# Patient Record
Sex: Female | Born: 1975 | Race: White | Hispanic: No | Smoking: Current every day smoker
Health system: Southern US, Community
[De-identification: ages and names within clinical notes are randomized; demographics above are authoritative.]

## PROBLEM LIST (undated history)

## (undated) DIAGNOSIS — F32A Depression, unspecified: Secondary | ICD-10-CM

## (undated) DIAGNOSIS — R51 Headache: Secondary | ICD-10-CM

## (undated) DIAGNOSIS — F1010 Alcohol abuse, uncomplicated: Secondary | ICD-10-CM

## (undated) DIAGNOSIS — F319 Bipolar disorder, unspecified: Secondary | ICD-10-CM

## (undated) DIAGNOSIS — F329 Major depressive disorder, single episode, unspecified: Secondary | ICD-10-CM

## (undated) DIAGNOSIS — F1910 Other psychoactive substance abuse, uncomplicated: Secondary | ICD-10-CM

## (undated) HISTORY — PX: BREAST ENHANCEMENT SURGERY: SHX7

---

## 1998-01-04 ENCOUNTER — Other Ambulatory Visit: Admission: RE | Admit: 1998-01-04 | Discharge: 1998-01-04 | Payer: Self-pay | Admitting: Obstetrics and Gynecology

## 1998-12-14 ENCOUNTER — Other Ambulatory Visit: Admission: RE | Admit: 1998-12-14 | Discharge: 1998-12-14 | Payer: Self-pay | Admitting: Obstetrics and Gynecology

## 1999-03-30 ENCOUNTER — Other Ambulatory Visit: Admission: RE | Admit: 1999-03-30 | Discharge: 1999-03-30 | Payer: Self-pay | Admitting: Obstetrics and Gynecology

## 1999-10-27 ENCOUNTER — Inpatient Hospital Stay (HOSPITAL_COMMUNITY): Admission: AD | Admit: 1999-10-27 | Discharge: 1999-10-28 | Payer: Self-pay | Admitting: *Deleted

## 2000-07-18 ENCOUNTER — Other Ambulatory Visit: Admission: RE | Admit: 2000-07-18 | Discharge: 2000-07-18 | Payer: Self-pay | Admitting: Obstetrics and Gynecology

## 2001-08-08 ENCOUNTER — Other Ambulatory Visit: Admission: RE | Admit: 2001-08-08 | Discharge: 2001-08-08 | Payer: Self-pay | Admitting: Obstetrics and Gynecology

## 2001-12-02 ENCOUNTER — Inpatient Hospital Stay (HOSPITAL_COMMUNITY): Admission: AD | Admit: 2001-12-02 | Discharge: 2001-12-04 | Payer: Self-pay | Admitting: Obstetrics and Gynecology

## 2003-08-17 ENCOUNTER — Other Ambulatory Visit: Admission: RE | Admit: 2003-08-17 | Discharge: 2003-08-17 | Payer: Self-pay | Admitting: Obstetrics and Gynecology

## 2004-11-16 ENCOUNTER — Other Ambulatory Visit: Admission: RE | Admit: 2004-11-16 | Discharge: 2004-11-16 | Payer: Self-pay | Admitting: Obstetrics and Gynecology

## 2005-07-08 ENCOUNTER — Emergency Department (HOSPITAL_COMMUNITY): Admission: EM | Admit: 2005-07-08 | Discharge: 2005-07-09 | Payer: Self-pay | Admitting: Emergency Medicine

## 2006-01-02 ENCOUNTER — Inpatient Hospital Stay (HOSPITAL_COMMUNITY): Admission: AD | Admit: 2006-01-02 | Discharge: 2006-01-04 | Payer: Self-pay | Admitting: Obstetrics and Gynecology

## 2006-02-16 ENCOUNTER — Other Ambulatory Visit: Admission: RE | Admit: 2006-02-16 | Discharge: 2006-02-16 | Payer: Self-pay | Admitting: Obstetrics and Gynecology

## 2009-03-03 ENCOUNTER — Inpatient Hospital Stay (HOSPITAL_COMMUNITY): Admission: AD | Admit: 2009-03-03 | Discharge: 2009-03-03 | Payer: Self-pay | Admitting: Obstetrics and Gynecology

## 2009-03-27 ENCOUNTER — Inpatient Hospital Stay (HOSPITAL_COMMUNITY): Admission: AD | Admit: 2009-03-27 | Discharge: 2009-03-29 | Payer: Self-pay | Admitting: Obstetrics and Gynecology

## 2010-07-19 LAB — CBC
HCT: 32.6 % — ABNORMAL LOW (ref 36.0–46.0)
HCT: 36 % (ref 36.0–46.0)
Hemoglobin: 11.1 g/dL — ABNORMAL LOW (ref 12.0–15.0)
MCHC: 34 g/dL (ref 30.0–36.0)
MCHC: 34.1 g/dL (ref 30.0–36.0)
MCV: 96.4 fL (ref 78.0–100.0)
RBC: 3.73 MIL/uL — ABNORMAL LOW (ref 3.87–5.11)
RDW: 13 % (ref 11.5–15.5)
WBC: 10.6 10*3/uL — ABNORMAL HIGH (ref 4.0–10.5)
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
## 2010-09-02 NOTE — H&P (Signed)
NAME:  Madison Garza, Madison Garza NO.:  [id number]   MEDICAL RECORD NO.:  [id number]                   PATIENT TYPE:  INP   LOCATION:  9174                                 FACILITY:  [facility]   PHYSICIAN:  Janine Limbo, M.D.            DATE OF BIRTH:  05/03/1975   DATE OF ADMISSION:  12/02/2001  DATE OF DISCHARGE:                                HISTORY & PHYSICAL   HISTORY OF PRESENT ILLNESS:  This is a 35 year old gravida 3 para 2 at 23  weeks who presents from the office status post rupture of membranes at 6:30  a.m. with clear fluid.  Cervix in the office was 4 cm, 70% effaced, and -2  station with a vertex presentation.  Pregnancy has been followed by the  nurse midwife service and is remarkable for:  1. Unknown LMP.  2. History of LGA.  3. Group B strep negative.   OBSTETRICAL HISTORY:  Remarkable for vaginal delivery in 1997 of  female  infant at [redacted] weeks gestation weighing 7 pounds 12 ounces, uncomplicated.  She had a spontaneous vaginal delivery in 2001 of a female infant at [redacted] weeks  gestation weighing 9 pounds 9 ounces, uncomplicated.   MEDICAL HISTORY:  Remarkable for abnormal Pap and cryosurgery, history of  HPV.   SURGICAL HISTORY:  Remarkable for wisdom teeth extraction.   FAMILY HISTORY:  Remarkable for maternal grandfather with MI, and paternal  grandmother and maternal grandfather with hypertension.  Maternal  grandmother with liver cancer.   GENETIC HISTORY:  Unremarkable.   SOCIAL HISTORY:  The patient is married to [facility] who is involved and  supportive.  She is of the [employer].  She denies  any alcohol, tobacco, or drug use.   PRENATAL LABORATORY DATA:  Hemoglobin 13.0, platelets 226.  Blood type A  positive, antibody screen negative.  RPR nonreactive.  Rubella equivocal.  HBsAg negative.  HIV nonreactive.  Gonorrhea negative, chlamydia negative.  AFP/free beta within normal limits.  Glucose challenge  within normal limits.  Group B strep negative.   OBJECTIVE DATA:  VITAL SIGNS:  Stable, afebrile.  HEENT:  Within normal limits.  NECK:  Thyroid normal, not enlarged.  CHEST:  Clear to auscultation.  BREASTS:  Soft, nontender.  HEART:  Regular rate and rhythm.  ABDOMEN:  Gravid in its contour.  Vertex to Leopold's.  EFM shows reactive  fetal heart rate with positive accelerations and average variability.  Uterine contractions every five to seven  minutes.  PELVIC:  Cervical exam in the office was 3-4 cm, 70% effaced, -2 station,  vertex presentation.  EXTREMITIES:  Within normal limits.    ASSESSMENT:  1. Intrauterine pregnancy at term.  2. Spontaneous rupture of membranes, clear fluid.  3. Early labor.   PLAN:  1. Admit to birthing suites, Dr. Stefano Gaul aware.  2. Routine C.N.M. orders.  3. Stadol p.r.n. for analgesia.  Marie L. Williams, C.N.M.                 Janine Limbo, M.D.    MLW/MEDQ  D:  12/02/2001  T:  12/02/2001  Job:  [id number]

## 2010-09-02 NOTE — H&P (Signed)
[facility]  Patient:    Madison Garza, Madison Garza                         MRN: [id number] Attending:  Georgina Peer, M.D. Dictator:   Wynelle Bourgeois, C.N.M.                         History and Physical  HISTORY OF PRESENT ILLNESS:   Madison Garza is a 35 year old, gravida 2, para 1-0-0-1, at 40-1/7 weeks who presented to the office this morning with complaint of regular uterine contractions since 4 a.m.  She denies any bleeding or leaking and reports positive fetal movement.  Her cervical examination in the office was 6 to 7 cm,  complete, and -1 to 0 station with a vertex presentation.  Her pregnancy has been remarkable for:  1) Abnormal Pap.  2) HPV.  3) Previous smoker.  4) First trimester bleeding.  5) Unknown LMP.  6) Rubella nonimmune.  7) Group B Strep negative.  PRENATAL LABORATORY DATA:     Hemoglobin 13.7, hematocrit 40.4, platelets 218. Blood type A positive, antibody screen negative, RPR nonreactive, rubella nonimmune.  HBSAG negative.  Pap test within normal limits.  Gonorrhea and Chlamydia both negative.  AFP free beta normal.  Glucose Challenge within normal limits.  Group B Strep negative.  PAST MEDICAL HISTORY:         Remarkable for abnormal Pap smear eight years ago  treated with cryosurgery and history of HPV.  She has also had wisdom teeth removed as a teenager.  PAST OBSTETRIC HISTORY:       Remarkable for a spontaneous vaginal delivery in December of 1997 of a female infant weight 7 pounds 15 ounces at 41-5/7 weeks after 10 hours of labor with no complications.  FAMILY HISTORY:               Remarkable for a maternal grandfather with heart disease, paternal grandmother with hypertension, and maternal grandfather with hypertension.  A maternal grandmother with liver cancer.  GENETIC HISTORY:              Unremarkable.  SOCIAL HISTORY:               She is married to [employer] who is involved and supportive.  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  She is  previous smoker who quit in November of 2000.  She denies any alcohol or drug use.  PHYSICAL EXAMINATION:  HEENT:                        Within normal limits.  Thyroid normal, not enlarged.  BREASTS:                      Soft and nontender.  CHEST:                        Clear to auscultation bilaterally.  HEART:                        Regular rate and rhythm.  ABDOMEN:                      Gravid at 39 cm.  Soft and nontender.  EFM reactive NST with a negative CST, average variability.  Uterine contractions  every three to four minutes, strong.  PELVIC:                       Cervical examination 6 to 7, complete, and 0 with a vertex presentation.  EXTREMITIES:                  Within normal limits.  ASSESSMENT:                   1. Intrauterine pregnancy at 40-1/7 weeks.                               2.  Active labor, transition phase.                               3. Group B Strep negative.  PLAN:                         1) Admit to birthing suite, Georgina Peer, M.D. notified.  2) Routine C.N.M. orders.  3) Anticipate spontaneous vaginal delivery. DD:  10/27/99 TD:  10/27/99 Job: [id number]

## 2011-12-24 ENCOUNTER — Encounter (HOSPITAL_COMMUNITY): Payer: Self-pay

## 2011-12-24 ENCOUNTER — Emergency Department (HOSPITAL_COMMUNITY)
Admission: EM | Admit: 2011-12-24 | Discharge: 2011-12-25 | Disposition: A | Discharge status: Home / Self Care | Payer: Self-pay | Attending: Emergency Medicine | Admitting: Emergency Medicine

## 2011-12-24 DIAGNOSIS — M436 Torticollis: Secondary | ICD-10-CM | POA: Insufficient documentation

## 2011-12-24 DIAGNOSIS — F17200 Nicotine dependence, unspecified, uncomplicated: Secondary | ICD-10-CM | POA: Insufficient documentation

## 2011-12-24 HISTORY — DX: Headache: R51

## 2011-12-24 MED ORDER — IBUPROFEN 800 MG PO TABS
800.0000 mg | ORAL_TABLET | Freq: Once | ORAL | Status: AC
  Administered 2011-12-24: 800 mg via ORAL
  Filled 2011-12-24: qty 1

## 2011-12-24 MED ORDER — DIAZEPAM 5 MG PO TABS
5.0000 mg | ORAL_TABLET | Freq: Once | ORAL | Status: AC
  Administered 2011-12-24: 5 mg via ORAL
  Filled 2011-12-24: qty 1

## 2011-12-24 NOTE — ED Notes (Addendum)
Pt reports "crick" in neck starting today. Pt has difficulty flexing and extending neck.  Pt reports she gets "cricks" in her neck every couple months with headaches. Pt states "the back of my head feels (tactile) funny". Pt has photosensitivity and dizziness when she bends down. Pt denies n/v/d, fevers, and chills

## 2011-12-24 NOTE — ED Provider Notes (Signed)
History     CSN: [id number]  Arrival date & time 12/24/11  2025   First MD Initiated Contact with Patient 12/24/11 2307      Chief Complaint  Patient presents with  . Headache    (Consider location/radiation/quality/duration/timing/severity/associated sxs/prior treatment) HPI Comments: Patient is having right neck pain, spasm, difficult.  For her to turn her head.  All the way to the right.  This is been going on for the past 24 hours.  She has a history of "quick" in her neck.  She does sleep with a ceiling fan.  That was on her head.  She also reports, that she has a small nodule on the top of her head, that she's concerned about it's tender to touch.  She also reports paresthesia, tingly feeling to her scalp is been going on and off for a while.zxShe has not taken any OTC meds for her symptoms  Patient is a 36 y.o. female presenting with headaches. The history is provided by the patient.  Headache  This is a new problem. The headache is associated with nothing. The quality of the pain is described as dull. The pain is at a severity of 2/10. The pain is mild. The pain does not radiate. Pertinent negatives include no fever, no shortness of breath and no nausea.    Past Medical History  Diagnosis Date  . Headache     Past Surgical History  Procedure Date  . Breast enhancement surgery     No family history on file.  History  Substance Use Topics  . Smoking status: Current Some Day Smoker -- 0.5 packs/day  . Smokeless tobacco: Not on file  . Alcohol Use: Yes     social    OB History    Grav Para Term Preterm Abortions TAB SAB Ect Mult Living                  Review of Systems  Constitutional: Negative for fever and chills.  HENT: Negative for congestion.   Respiratory: Negative for shortness of breath.   Gastrointestinal: Negative for nausea.  Musculoskeletal: Negative for joint swelling.  Skin: Negative for pallor, rash and wound.  Neurological: Positive for  headaches. Negative for dizziness.    Allergies  Review of patient's allergies indicates no known allergies.  Home Medications   Current Outpatient Rx  Name Route Sig Dispense Refill  . ASPIRIN-ACETAMINOPHEN-CAFFEINE 250-250-65 MG PO TABS Oral Take 1 tablet by mouth every 6 (six) hours as needed. migraine    . PERMETHRIN 5 % EX CREA Topical Apply 1 application topically once.    Marland Kitchen HYDROCODONE-ACETAMINOPHEN 5-325 MG PO TABS Oral Take 1 tablet by mouth every 4 (four) hours as needed for pain. 5 tablet 0    For severe pain  . IBUPROFEN 800 MG PO TABS Oral Take 1 tablet (800 mg total) by mouth every 6 (six) hours as needed for pain. 30 tablet 0    BP 99/55  Pulse 60  Temp 98.8 F (37.1 C) (Oral)  Resp 16  Ht 5\' 3"  (1.6 m)  Wt 122 lb (55.339 kg)  BMI 21.61 kg/m2  SpO2 100%  LMP 12/03/2011  Physical Exam  Constitutional: She is oriented to person, place, and time. She appears well-developed and well-nourished.  HENT:  Head: Normocephalic.    Eyes: Pupils are equal, round, and reactive to light.  Neck: Muscular tenderness present. No spinous process tenderness present. No rigidity.    Cardiovascular: Normal rate.   Pulmonary/Chest:  Effort normal.  Musculoskeletal: Normal range of motion.  Neurological: She is alert and oriented to person, place, and time.  Skin: Skin is warm. No rash noted.    ED Course  Procedures (including critical care time)  Labs Reviewed - No data to display No results found.   1. Torticollis       MDM  We'll treat this patient's torticollis with by mouth Valium, and warm compresses        Arman Filter, NP 12/25/11 0221

## 2011-12-24 NOTE — ED Notes (Signed)
Pt states chronic headaches-states dull headache now-states sharp pains to her left temple and left ear-states "prickling and itching feeling" to the top of her head-states she has a "crick" in the right side of her neck

## 2011-12-25 MED ORDER — HYDROCODONE-ACETAMINOPHEN 5-325 MG PO TABS
1.0000 | ORAL_TABLET | Freq: Once | ORAL | Status: AC
  Administered 2011-12-25: 1 via ORAL
  Filled 2011-12-25: qty 1

## 2011-12-25 MED ORDER — HYDROCODONE-ACETAMINOPHEN 5-325 MG PO TABS: 1.0000 | ORAL_TABLET | ORAL | Status: AC | PRN

## 2011-12-25 MED ORDER — IBUPROFEN 800 MG PO TABS: 800.0000 mg | ORAL_TABLET | Freq: Four times a day (QID) | ORAL | Status: AC | PRN

## 2011-12-25 NOTE — ED Provider Notes (Signed)
Medical screening examination/treatment/procedure(s) were performed by non-physician practitioner and as supervising physician I was immediately available for consultation/collaboration.   Shanikka Wonders L Lanina Larranaga, MD 12/25/11 0738 

## 2012-04-22 ENCOUNTER — Encounter (HOSPITAL_COMMUNITY): Payer: Self-pay | Admitting: *Deleted

## 2012-04-22 ENCOUNTER — Emergency Department (HOSPITAL_COMMUNITY)
Admission: EM | Admit: 2012-04-22 | Discharge: 2012-04-23 | Disposition: A | Discharge status: Home / Self Care | Payer: Self-pay | Attending: Emergency Medicine | Admitting: Emergency Medicine

## 2012-04-22 ENCOUNTER — Ambulatory Visit (HOSPITAL_COMMUNITY)
Admission: RE | Admit: 2012-04-22 | Discharge: 2012-04-22 | Disposition: A | Discharge status: Home / Self Care | Payer: Self-pay | Attending: Psychiatry | Admitting: Psychiatry

## 2012-04-22 DIAGNOSIS — Z3202 Encounter for pregnancy test, result negative: Secondary | ICD-10-CM | POA: Insufficient documentation

## 2012-04-22 DIAGNOSIS — R51 Headache: Secondary | ICD-10-CM | POA: Insufficient documentation

## 2012-04-22 DIAGNOSIS — F329 Major depressive disorder, single episode, unspecified: Secondary | ICD-10-CM

## 2012-04-22 DIAGNOSIS — F1920 Other psychoactive substance dependence, uncomplicated: Secondary | ICD-10-CM | POA: Insufficient documentation

## 2012-04-22 DIAGNOSIS — F1020 Alcohol dependence, uncomplicated: Secondary | ICD-10-CM | POA: Insufficient documentation

## 2012-04-22 DIAGNOSIS — F1010 Alcohol abuse, uncomplicated: Secondary | ICD-10-CM | POA: Insufficient documentation

## 2012-04-22 DIAGNOSIS — Z791 Long term (current) use of non-steroidal anti-inflammatories (NSAID): Secondary | ICD-10-CM | POA: Insufficient documentation

## 2012-04-22 DIAGNOSIS — F3289 Other specified depressive episodes: Secondary | ICD-10-CM | POA: Insufficient documentation

## 2012-04-22 DIAGNOSIS — F17200 Nicotine dependence, unspecified, uncomplicated: Secondary | ICD-10-CM | POA: Insufficient documentation

## 2012-04-22 DIAGNOSIS — Z8659 Personal history of other mental and behavioral disorders: Secondary | ICD-10-CM | POA: Insufficient documentation

## 2012-04-22 DIAGNOSIS — F39 Unspecified mood [affective] disorder: Secondary | ICD-10-CM | POA: Insufficient documentation

## 2012-04-22 DIAGNOSIS — F10929 Alcohol use, unspecified with intoxication, unspecified: Secondary | ICD-10-CM

## 2012-04-22 HISTORY — DX: Major depressive disorder, single episode, unspecified: F32.9

## 2012-04-22 HISTORY — DX: Depression, unspecified: F32.A

## 2012-04-22 LAB — RAPID URINE DRUG SCREEN, HOSP PERFORMED
Amphetamines: POSITIVE — AB
Barbiturates: NOT DETECTED
Benzodiazepines: POSITIVE — AB
Cocaine: NOT DETECTED

## 2012-04-22 LAB — CBC
MCH: 31.1 pg (ref 26.0–34.0)
MCHC: 34.3 g/dL (ref 30.0–36.0)
Platelets: 262 10*3/uL (ref 150–400)
RDW: 13.5 % (ref 11.5–15.5)

## 2012-04-22 LAB — COMPREHENSIVE METABOLIC PANEL
ALT: 19 U/L (ref 0–35)
AST: 31 U/L (ref 0–37)
Albumin: 4 g/dL (ref 3.5–5.2)
Calcium: 9.1 mg/dL (ref 8.4–10.5)
GFR calc Af Amer: >90 mL/min (ref >90)
Glucose, Bld: 91 mg/dL (ref 70–99)
Sodium: 140 mEq/L (ref 135–145)
Total Protein: 7.4 g/dL (ref 6.0–8.3)

## 2012-04-22 LAB — URINE MICROSCOPIC-ADD ON

## 2012-04-22 LAB — URINALYSIS, ROUTINE W REFLEX MICROSCOPIC
Bilirubin Urine: NEGATIVE
Hgb urine dipstick: NEGATIVE
Specific Gravity, Urine: 1.013 (ref 1.005–1.030)
Urobilinogen, UA: 0.2 mg/dL (ref 0.0–1.0)

## 2012-04-22 MED ORDER — DICYCLOMINE HCL 20 MG PO TABS
20.0000 mg | ORAL_TABLET | Freq: Four times a day (QID) | ORAL | Status: DC | PRN
  Administered 2012-04-23: 20 mg via ORAL
  Filled 2012-04-22: qty 1

## 2012-04-22 MED ORDER — ONDANSETRON 4 MG PO TBDP
4.0000 mg | ORAL_TABLET | Freq: Four times a day (QID) | ORAL | Status: DC | PRN
  Administered 2012-04-23: 4 mg via ORAL
  Filled 2012-04-22: qty 1

## 2012-04-22 MED ORDER — ZOLPIDEM TARTRATE 5 MG PO TABS
5.0000 mg | ORAL_TABLET | Freq: Every evening | ORAL | Status: DC | PRN
  Administered 2012-04-22: 5 mg via ORAL
  Filled 2012-04-22: qty 1

## 2012-04-22 MED ORDER — CLONIDINE HCL 0.1 MG PO TABS: 0.1000 mg | ORAL_TABLET | ORAL | Status: DC

## 2012-04-22 MED ORDER — LOPERAMIDE HCL 2 MG PO CAPS: 2.0000 mg | ORAL_CAPSULE | ORAL | Status: DC | PRN

## 2012-04-22 MED ORDER — NAPROXEN 500 MG PO TABS: 500.0000 mg | ORAL_TABLET | Freq: Two times a day (BID) | ORAL | Status: DC | PRN

## 2012-04-22 MED ORDER — NICOTINE 21 MG/24HR TD PT24
21.0000 mg | MEDICATED_PATCH | Freq: Every day | TRANSDERMAL | Status: DC
  Administered 2012-04-22 – 2012-04-23 (×2): 21 mg via TRANSDERMAL
  Filled 2012-04-22: qty 1

## 2012-04-22 MED ORDER — IBUPROFEN 600 MG PO TABS: 600.0000 mg | ORAL_TABLET | Freq: Three times a day (TID) | ORAL | Status: DC | PRN

## 2012-04-22 MED ORDER — WHITE PETROLATUM GEL
Status: AC
  Administered 2012-04-22: 1
  Filled 2012-04-22: qty 5

## 2012-04-22 MED ORDER — HYDROXYZINE HCL 25 MG PO TABS: 25.0000 mg | ORAL_TABLET | Freq: Four times a day (QID) | ORAL | Status: DC | PRN

## 2012-04-22 MED ORDER — CLONIDINE HCL 0.1 MG PO TABS: 0.1000 mg | ORAL_TABLET | Freq: Every day | ORAL | Status: DC

## 2012-04-22 MED ORDER — LORAZEPAM 1 MG PO TABS
1.0000 mg | ORAL_TABLET | ORAL | Status: DC | PRN
  Administered 2012-04-22 – 2012-04-23 (×5): 1 mg via ORAL
  Filled 2012-04-22 (×3): qty 1

## 2012-04-22 MED ORDER — CEPHALEXIN 500 MG PO CAPS
500.0000 mg | ORAL_CAPSULE | Freq: Four times a day (QID) | ORAL | Status: DC
  Administered 2012-04-22 – 2012-04-23 (×4): 500 mg via ORAL
  Filled 2012-04-22 (×4): qty 1

## 2012-04-22 MED ORDER — ONDANSETRON HCL 4 MG PO TABS: 4.0000 mg | ORAL_TABLET | Freq: Three times a day (TID) | ORAL | Status: DC | PRN

## 2012-04-22 MED ORDER — CLONIDINE HCL 0.1 MG PO TABS
0.1000 mg | ORAL_TABLET | Freq: Four times a day (QID) | ORAL | Status: DC
  Administered 2012-04-22 – 2012-04-23 (×4): 0.1 mg via ORAL
  Filled 2012-04-22 (×4): qty 1

## 2012-04-22 MED ORDER — METHOCARBAMOL 500 MG PO TABS: 500.0000 mg | ORAL_TABLET | Freq: Three times a day (TID) | ORAL | Status: DC | PRN

## 2012-04-22 NOTE — ED Notes (Addendum)
Pt presents from [facility] for medical clearance. 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 Reports last used cocaine "last weekend" and "a half of a sparks 1 hour ago." Pt also reports using "3 lines of molly this morning before I came here." Pt reports thoughts of suicide, but "I would never do it to myself, but I think it would be easier if I was gone so I wouldn't fuck up every ones life." pt also c/o abd pain.

## 2012-04-22 NOTE — ED Provider Notes (Signed)
History    This chart was scribed for Celene Kras, MD by Marlin Canary. The patient was seen in room WTR4/WLPT4. Patient's care was started at 1452.  CSN: [id number]  Arrival date & time 04/22/12  1452   First MD Initiated Contact with Patient 04/22/12 1608      Chief Complaint  Patient presents with  . Medical Clearance    (Consider location/radiation/quality/duration/timing/severity/associated sxs/prior treatment) The history is provided by the spouse. No language interpreter was used.   Madison Garza is a 37 y.o. female who presents to the Emergency Department complaining of depression. Her boyfriend brought her to the ED because he suspected her of using illegal drugs. Patient has a history of OCD, anxiety, and depression. She has a history of drug use for about 20 years since high school. States using whatever she can get her hand son. She smokes and consumes alcohol daily. She denies pregnancy, vaginal bleeding or any other symptoms. The patient refuses to see her psychiatrist. Denies SI or HI.    Past Medical History  Diagnosis Date  . Headache   . Depression     Past Surgical History  Procedure Date  . Breast enhancement surgery     History reviewed. No pertinent family history.  History  Substance Use Topics  . Smoking status: Current Every Day Smoker -- 1.0 packs/day  . Smokeless tobacco: Never Used  . Alcohol Use: 42.0 oz/week    70 Cans of beer per week    OB History    Grav Para Term Preterm Abortions TAB SAB Ect Mult Living                  Review of Systems  Constitutional: Negative for chills and diaphoresis.  HENT: Negative.   Eyes: Negative.   Respiratory: Negative.   Cardiovascular: Negative.   Gastrointestinal: Negative.   Genitourinary: Negative.   Musculoskeletal: Negative.   Skin: Negative.   Neurological: Negative.   Psychiatric/Behavioral: Negative.   All other systems reviewed and are negative.   A complete 10 system  review of systems was obtained and all systems are negative except as noted in the HPI and PMH.   Allergies  Review of patient's allergies indicates no known allergies.  Home Medications   Current Outpatient Rx  Name  Route  Sig  Dispense  Refill  . ASPIRIN-ACETAMINOPHEN-CAFFEINE 250-250-65 MG PO TABS   Oral   Take 1 tablet by mouth every 6 (six) hours as needed. migraine           BP 130/91  Pulse 103  Temp 99 F (37.2 C) (Oral)  Resp 18  SpO2 98%  LMP 04/08/2012       Physical Exam  Vitals reviewed. Constitutional: She appears well-developed. No distress.  Eyes: Pupils are equal, round, and reactive to light.  Neck: Neck supple.  Cardiovascular: Normal rate, regular rhythm and normal heart sounds.  Exam reveals no gallop and no friction rub.   No murmur heard. Pulmonary/Chest: Breath sounds normal. No respiratory distress. She has no wheezes. She has no rales. She exhibits no tenderness.  Abdominal: Soft. Bowel sounds are normal. She exhibits no distension. There is no tenderness. There is no rebound.  Musculoskeletal: She exhibits no edema.  Psychiatric: Her mood appears anxious. She exhibits a depressed mood.    ED Course  Procedures (including critical care time)  DIAGNOSTIC STUDIES: Oxygen Saturation is 98% on room air,Normal  by my interpretation.    COORDINATION OF CARE:  1620-Patient / Family / Caregiver informed of clinical course, understand medical decision-making process, and agree with plan.     Results for orders placed during the hospital encounter of 04/22/12  CBC      Component Value Range   WBC 4.6  4.0 - 10.5 K/uL   RBC 4.99  3.87 - 5.11 MIL/uL   Hemoglobin 15.5 (*) 12.0 - 15.0 g/dL   HCT 86.5  78.4 - 69.6 %   MCV 90.6  78.0 - 100.0 fL   MCH 31.1  26.0 - 34.0 pg   MCHC 34.3  30.0 - 36.0 g/dL   RDW 29.5  28.4 - 13.2 %   Platelets 262  150 - 400 K/uL  COMPREHENSIVE METABOLIC PANEL      Component Value Range   Sodium 140  135 -  145 mEq/L   Potassium 4.0  3.5 - 5.1 mEq/L   Chloride 104  96 - 112 mEq/L   CO2 27  19 - 32 mEq/L   Glucose, Bld 91  70 - 99 mg/dL   BUN 7  6 - 23 mg/dL   Creatinine, Ser 4.40  0.50 - 1.10 mg/dL   Calcium 9.1  8.4 - 10.2 mg/dL   Total Protein 7.4  6.0 - 8.3 g/dL   Albumin 4.0  3.5 - 5.2 g/dL   AST 31  0 - 37 U/L   ALT 19  0 - 35 U/L   Alkaline Phosphatase 56  39 - 117 U/L   Total Bilirubin 0.3  0.3 - 1.2 mg/dL   GFR calc non Af Amer >90  >90 mL/min   GFR calc Af Amer >90  >90 mL/min  ETHANOL      Component Value Range   Alcohol, Ethyl (B) 69 (*) 0 - 11 mg/dL  URINE RAPID DRUG SCREEN (HOSP PERFORMED)      Component Value Range   Opiates NONE DETECTED  NONE DETECTED   Cocaine NONE DETECTED  NONE DETECTED   Benzodiazepines POSITIVE (*) NONE DETECTED   Amphetamines POSITIVE (*) NONE DETECTED   Tetrahydrocannabinol POSITIVE (*) NONE DETECTED   Barbiturates NONE DETECTED  NONE DETECTED  POCT PREGNANCY, URINE      Component Value Range   Preg Test, Ur NEGATIVE  NEGATIVE  URINALYSIS, ROUTINE W REFLEX MICROSCOPIC      Component Value Range   Color, Urine YELLOW  YELLOW   APPearance CLEAR  CLEAR   Specific Gravity, Urine 1.013  1.005 - 1.030   pH 5.5  5.0 - 8.0   Glucose, UA NEGATIVE  NEGATIVE mg/dL   Hgb urine dipstick NEGATIVE  NEGATIVE   Bilirubin Urine NEGATIVE  NEGATIVE   Ketones, ur NEGATIVE  NEGATIVE mg/dL   Protein, ur NEGATIVE  NEGATIVE mg/dL   Urobilinogen, UA 0.2  0.0 - 1.0 mg/dL   Nitrite POSITIVE (*) NEGATIVE   Leukocytes, UA TRACE (*) NEGATIVE  ACETAMINOPHEN LEVEL      Component Value Range   Acetaminophen (Tylenol), Serum <15.0  10 - 30 ug/mL  URINE MICROSCOPIC-ADD ON      Component Value Range   Squamous Epithelial / LPF RARE  RARE   WBC, UA 0-2  <3 WBC/hpf   RBC / HPF 0-2  <3 RBC/hpf   Bacteria, UA FEW (*) RARE   Casts HYALINE CASTS (*) NEGATIVE  URINE CULTURE      Component Value Range   Specimen Description URINE, CLEAN CATCH     Special Requests NONE      Culture  Setup Time 04/23/2012 05:16     Colony Count PENDING     Culture Culture reincubated for better growth     Report Status PENDING     No results found.  Pt with polysubstance abuse. Medically cleared. Will get [facility] to assess. Pt not suicidal or homicidal.   1. Depression   2. Alcohol intoxication       MDM       I personally performed the services described in this documentation, which was scribed in my presence. The recorded information has been reviewed and is accurate.     Lottie Mussel, PA 04/25/12 0028

## 2012-04-22 NOTE — ED Notes (Signed)
Received from triage in no acute distress. Endorsing SI thoughts but denies intent

## 2012-04-22 NOTE — BHH Counselor (Signed)
Pt assessed at [facility]; sent to [facility] for medical clearance; pending review.  *04/23/11 @ 1808 Notified AC-Eric that patients labs were resulted.
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
## 2012-04-23 ENCOUNTER — Encounter (HOSPITAL_COMMUNITY): Payer: Self-pay | Admitting: *Deleted

## 2012-04-23 ENCOUNTER — Inpatient Hospital Stay (HOSPITAL_COMMUNITY)
Admission: EM | Admit: 2012-04-23 | Discharge: 2012-05-02 | DRG: 897 | Disposition: A | Discharge status: Home / Self Care | Payer: Federal, State, Local not specified - Other | Source: Ambulatory Visit | Attending: Psychiatry | Admitting: Psychiatry

## 2012-04-23 DIAGNOSIS — F411 Generalized anxiety disorder: Secondary | ICD-10-CM | POA: Diagnosis present

## 2012-04-23 DIAGNOSIS — Z79899 Other long term (current) drug therapy: Secondary | ICD-10-CM

## 2012-04-23 DIAGNOSIS — F1010 Alcohol abuse, uncomplicated: Principal | ICD-10-CM | POA: Diagnosis present

## 2012-04-23 DIAGNOSIS — F39 Unspecified mood [affective] disorder: Secondary | ICD-10-CM | POA: Diagnosis present

## 2012-04-23 DIAGNOSIS — F1920 Other psychoactive substance dependence, uncomplicated: Secondary | ICD-10-CM | POA: Diagnosis present

## 2012-04-23 DIAGNOSIS — F419 Anxiety disorder, unspecified: Secondary | ICD-10-CM | POA: Diagnosis present

## 2012-04-23 DIAGNOSIS — F1910 Other psychoactive substance abuse, uncomplicated: Secondary | ICD-10-CM | POA: Diagnosis present

## 2012-04-23 MED ORDER — CHLORDIAZEPOXIDE HCL 25 MG PO CAPS
25.0000 mg | ORAL_CAPSULE | Freq: Four times a day (QID) | ORAL | Status: AC | PRN
  Administered 2012-04-23 – 2012-04-26 (×4): 25 mg via ORAL
  Filled 2012-04-23 (×3): qty 1

## 2012-04-23 MED ORDER — LOPERAMIDE HCL 2 MG PO CAPS: 2.0000 mg | ORAL_CAPSULE | ORAL | Status: AC | PRN

## 2012-04-23 MED ORDER — ALUM & MAG HYDROXIDE-SIMETH 200-200-20 MG/5ML PO SUSP: 30.0000 mL | ORAL | Status: DC | PRN

## 2012-04-23 MED ORDER — CHLORDIAZEPOXIDE HCL 25 MG PO CAPS
25.0000 mg | ORAL_CAPSULE | ORAL | Status: AC
  Administered 2012-04-26 – 2012-04-27 (×2): 25 mg via ORAL
  Filled 2012-04-23 (×3): qty 1

## 2012-04-23 MED ORDER — CHLORDIAZEPOXIDE HCL 25 MG PO CAPS
25.0000 mg | ORAL_CAPSULE | Freq: Every day | ORAL | Status: AC
  Administered 2012-04-28: 25 mg via ORAL
  Filled 2012-04-23: qty 1

## 2012-04-23 MED ORDER — CHLORDIAZEPOXIDE HCL 25 MG PO CAPS
25.0000 mg | ORAL_CAPSULE | Freq: Three times a day (TID) | ORAL | Status: AC
  Administered 2012-04-25 – 2012-04-26 (×3): 25 mg via ORAL
  Filled 2012-04-23 (×3): qty 1

## 2012-04-23 MED ORDER — LORAZEPAM 1 MG PO TABS
ORAL_TABLET | ORAL | Status: AC
  Filled 2012-04-23: qty 1

## 2012-04-23 MED ORDER — ACETAMINOPHEN 325 MG PO TABS
650.0000 mg | ORAL_TABLET | Freq: Four times a day (QID) | ORAL | Status: DC | PRN
  Administered 2012-04-23 – 2012-04-24 (×2): 650 mg via ORAL

## 2012-04-23 MED ORDER — LEVOFLOXACIN 500 MG PO TABS
500.0000 mg | ORAL_TABLET | Freq: Every day | ORAL | Status: DC
  Filled 2012-04-23 (×2): qty 1

## 2012-04-23 MED ORDER — ADULT MULTIVITAMIN W/MINERALS CH
1.0000 | ORAL_TABLET | Freq: Every day | ORAL | Status: DC
  Administered 2012-04-23 – 2012-05-01 (×9): 1 via ORAL
  Filled 2012-04-23 (×12): qty 1

## 2012-04-23 MED ORDER — CHLORDIAZEPOXIDE HCL 25 MG PO CAPS
25.0000 mg | ORAL_CAPSULE | Freq: Four times a day (QID) | ORAL | Status: AC
  Administered 2012-04-23 – 2012-04-25 (×6): 25 mg via ORAL
  Filled 2012-04-23 (×6): qty 1

## 2012-04-23 MED ORDER — HYDROXYZINE HCL 25 MG PO TABS
25.0000 mg | ORAL_TABLET | Freq: Four times a day (QID) | ORAL | Status: AC | PRN
  Administered 2012-04-24 – 2012-04-25 (×3): 25 mg via ORAL
  Filled 2012-04-23: qty 1

## 2012-04-23 MED ORDER — NICOTINE 21 MG/24HR TD PT24
21.0000 mg | MEDICATED_PATCH | Freq: Every day | TRANSDERMAL | Status: DC
  Administered 2012-04-23 – 2012-05-01 (×9): 21 mg via TRANSDERMAL
  Filled 2012-04-23 (×13): qty 1

## 2012-04-23 MED ORDER — THIAMINE HCL 100 MG/ML IJ SOLN: 100.0000 mg | Freq: Once | INTRAMUSCULAR | Status: DC

## 2012-04-23 MED ORDER — ONDANSETRON 4 MG PO TBDP
4.0000 mg | ORAL_TABLET | Freq: Four times a day (QID) | ORAL | Status: AC | PRN
  Administered 2012-04-23 – 2012-04-25 (×2): 4 mg via ORAL
  Filled 2012-04-23: qty 1

## 2012-04-23 MED ORDER — LEVOFLOXACIN 500 MG PO TABS
500.0000 mg | ORAL_TABLET | Freq: Every day | ORAL | Status: AC
  Administered 2012-04-24 – 2012-04-26 (×3): 500 mg via ORAL
  Filled 2012-04-23 (×3): qty 1

## 2012-04-23 MED ORDER — VITAMIN B-1 100 MG PO TABS
100.0000 mg | ORAL_TABLET | Freq: Every day | ORAL | Status: DC
  Administered 2012-04-24 – 2012-05-01 (×8): 100 mg via ORAL
  Filled 2012-04-23 (×10): qty 1

## 2012-04-23 MED ORDER — MAGNESIUM HYDROXIDE 400 MG/5ML PO SUSP: 30.0000 mL | Freq: Every day | ORAL | Status: DC | PRN

## 2012-04-23 MED ORDER — HYDROXYZINE HCL 25 MG PO TABS
25.0000 mg | ORAL_TABLET | Freq: Every evening | ORAL | Status: DC | PRN
  Administered 2012-04-23 – 2012-05-01 (×9): 25 mg via ORAL
  Filled 2012-04-23 (×2): qty 1
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
## 2012-04-23 NOTE — ED Notes (Signed)
Madison DODGEN is a 37 y.o. female who is here for depression. She has very poor insight. She has been assessed by [facility], and has been accepted at the behavioral health hospital, pending, an open bed. Today, the patient is alert, cooperative, and eating. Vital signs are normal.  Flint Melter, MD 04/23/12 [phone]

## 2012-04-23 NOTE — BHH Counselor (Signed)
Pt accepted pending an avail bed---per Donell Sievert *Pt will go to 302-1 once bed becomes available. *Support paperwork completed and placed in patients chart.

## 2012-04-23 NOTE — ED Notes (Signed)
Madison Garza is a 37 y.o. female was be evaluated for depression. She has polysubstance abuse. Urine culture is pending  Flint Melter, MD 04/23/12 1756

## 2012-04-23 NOTE — ED Notes (Signed)
Patient reports that she has anxiety, abdominal pain and nausea.

## 2012-04-23 NOTE — Progress Notes (Signed)
Psychoeducational Group Note  Date:  04/23/2012 Time: 2000  Group Topic/Focus:  AA  Participation Level:  Minimal  Participation Quality:  Attentive  Affect:  Appropriate  Cognitive:  Appropriate  Insight:  Improving  Engagement in Group:  Limited  Additional Comments:    Humberto Seals Monique 04/23/2012, 10:07 PM

## 2012-04-23 NOTE — Tx Team (Signed)
Initial Interdisciplinary Treatment Plan  PATIENT STRENGTHS: (choose at least two) Ability for insight General fund of knowledge Physical Health  PATIENT STRESSORS: Substance abuse   PROBLEM LIST: Problem List/Patient Goals Date to be addressed Date deferred Reason deferred Estimated date of resolution  Substance abuse 04/23/12     UTI 04/23/12                                                DISCHARGE CRITERIA:  Ability to meet basic life and health needs Adequate post-discharge living arrangements Improved stabilization in mood, thinking, and/or behavior Motivation to continue treatment in a less acute level of care Need for constant or close observation no longer present Verbal commitment to aftercare and medication compliance Withdrawal symptoms are absent or subacute and managed without 24-hour nursing intervention  PRELIMINARY DISCHARGE PLAN: Attend 12-step recovery group Outpatient therapy Placement in alternative living arrangements  PATIENT/FAMIILY INVOLVEMENT: This treatment plan has been presented to and reviewed with the patient, Madison Garza, and/or family member, .  The patient and family have been given the opportunity to ask questions and make suggestions.  Beatrix Shipper 04/23/2012, 6:56 PM

## 2012-04-24 ENCOUNTER — Encounter (HOSPITAL_COMMUNITY): Payer: Self-pay | Admitting: Psychiatry

## 2012-04-24 DIAGNOSIS — F4310 Post-traumatic stress disorder, unspecified: Secondary | ICD-10-CM

## 2012-04-24 DIAGNOSIS — F1920 Other psychoactive substance dependence, uncomplicated: Secondary | ICD-10-CM

## 2012-04-24 DIAGNOSIS — F39 Unspecified mood [affective] disorder: Secondary | ICD-10-CM | POA: Diagnosis present

## 2012-04-24 DIAGNOSIS — F419 Anxiety disorder, unspecified: Secondary | ICD-10-CM | POA: Diagnosis present

## 2012-04-24 DIAGNOSIS — F411 Generalized anxiety disorder: Secondary | ICD-10-CM

## 2012-04-24 MED ORDER — TRAZODONE HCL 50 MG PO TABS
50.0000 mg | ORAL_TABLET | Freq: Every evening | ORAL | Status: DC | PRN
  Administered 2012-04-24 – 2012-04-25 (×2): 50 mg via ORAL
  Filled 2012-04-24 (×7): qty 1

## 2012-04-24 MED ORDER — NAPROXEN 500 MG PO TABS
500.0000 mg | ORAL_TABLET | Freq: Two times a day (BID) | ORAL | Status: DC | PRN
  Administered 2012-04-24 – 2012-04-30 (×5): 500 mg via ORAL
  Filled 2012-04-24 (×5): qty 1

## 2012-04-24 MED ORDER — NAPROXEN 500 MG PO TABS: 500.0000 mg | ORAL_TABLET | Freq: Two times a day (BID) | ORAL | Status: DC

## 2012-04-24 NOTE — Progress Notes (Signed)
Psychoeducational Group Note  Date:  04/24/2012 Time:  1100  Group Topic/Focus:  Personal Choices and Values:   The focus of this group is to help patients assess and explore the importance of values in their lives, how their values affect their decisions, how they express their values and what opposes their expression.  Participation Level:  Active  Participation Quality:  Appropriate  Affect:  Appropriate  Cognitive:  Appropriate  Insight:  Engaged  Engagement in Group:  Engaged  Additional Comments:  Pt was appropriate and attentive while attending group. Pt shared that her top three values are self acceptance, being a better parent, and appreciate herself. Pt wants to grow in being emotionally stable.   Sharyn Lull 04/24/2012, 11:59 AM

## 2012-04-24 NOTE — Progress Notes (Signed)
Psychoeducational Group Note  Date:  04/24/2012 Time:  2000  Group Topic/Focus: AA Meeting  Participation Level:  Active  Participation Quality:    Affect:    Cognitive:    Insight:    Engagement in Group:    Additional Comments:Patient did attend AA meeting.  Diego Ulbricht, Newton Pigg 04/24/2012, 11:58 PM

## 2012-04-24 NOTE — Clinical Social Work Note (Signed)
[facility] LCSW Group Therapy   Type of Therapy:  Group Therapy  Participation Level:  Patient's first day, was tearful and left group early    Clide Dales 04/24/2012 8:28 PM

## 2012-04-24 NOTE — Clinical Social Work Note (Signed)
[facility] LCSW Group Therapy   Type of Therapy:  Discharge Planning  Participation Level:  Did Not Attend  Madison Garza 04/24/2012 8:27 PM
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
## 2012-04-24 NOTE — Progress Notes (Signed)
D:  Per pt self inventory pt reports sleeping poorly, required medication at HS, appetite poor, energy level low, ability to pay attention poor, rates depression at a 10 out of 10 and hopelessness at a 10 out of 10, denies SI/HI/AVH, reports cravings and chills, c/o headache 9/10.      A:  See pain assessment and interventions for pain flowsheet, Emotional support provided, Encouraged pt to continue with treatment plan and attend all group activities, q15 min checks maintained for safety.  R:  Pt states that she wants to strive for a positive attitude, pleasant and cooperative, seems vested in treatment.
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
## 2012-04-24 NOTE — Progress Notes (Signed)
Pt reports she is feeling a little better, but she has a headache that won't go away.  She is still somewhat shaky.  She says she misses her children and wants to be a better mother.  Pt is on Librium protocol for withdrawal symptoms.  She is considering long term treatment.  She denies SI/HI/AV at this time.  Support/encouragement given.  Pt encouraged to make her needs known to staff.  Pt voices understanding.  Safety maintained with q15 minute checks.

## 2012-04-25 DIAGNOSIS — F329 Major depressive disorder, single episode, unspecified: Secondary | ICD-10-CM

## 2012-04-25 DIAGNOSIS — F411 Generalized anxiety disorder: Secondary | ICD-10-CM

## 2012-04-25 LAB — URINE CULTURE: Colony Count: >=100000

## 2012-04-25 MED ORDER — LIDOCAINE 5 % EX PTCH
1.0000 | MEDICATED_PATCH | Freq: Every day | CUTANEOUS | Status: DC
  Administered 2012-04-25 – 2012-04-27 (×3): 1 via TRANSDERMAL
  Filled 2012-04-25 (×6): qty 1

## 2012-04-25 MED ORDER — BUTALBITAL-APAP-CAFFEINE 50-325-40 MG PO TABS
2.0000 | ORAL_TABLET | Freq: Once | ORAL | Status: AC
  Administered 2012-04-25: 2 via ORAL
  Filled 2012-04-25: qty 2

## 2012-04-25 MED ORDER — BUTALBITAL-APAP-CAFFEINE 50-325-40 MG PO TABS
2.0000 | ORAL_TABLET | Freq: Three times a day (TID) | ORAL | Status: DC | PRN
  Administered 2012-04-25 – 2012-04-26 (×2): 2 via ORAL
  Filled 2012-04-25 (×2): qty 2

## 2012-04-25 MED ORDER — GABAPENTIN 100 MG PO CAPS
200.0000 mg | ORAL_CAPSULE | Freq: Three times a day (TID) | ORAL | Status: DC
  Administered 2012-04-25 – 2012-04-26 (×4): 200 mg via ORAL
  Filled 2012-04-25 (×7): qty 2

## 2012-04-25 NOTE — Progress Notes (Signed)
Psychoeducational Group Note  Date:  04/25/2012 Time:  1100  Group Topic/Focus:  Rediscovering Joy:   The focus of this group is to explore various ways to relieve stress in a positive manner.  Participation Level: Did Not Attend  Participation Quality:  Not Applicable  Affect:  Not Applicable  Cognitive:  Not Applicable  Insight:  Not Applicable  Engagement in Group: Not Applicable  Additional Comments:  Pt did not feel well and could not attend group.  Carroll Lingelbach E 04/25/2012, 1:47 PM

## 2012-04-25 NOTE — Progress Notes (Signed)
Nutrition Brief Note  Patient identified on the Malnutrition Screening Tool (MST) Report. [facility] Staff to discuss best time to meet with pt. Will visit pt tomorrow afternoon after group.   Levon Hedger MS, RD, LDN [phone] Pager [phone] After Hours Pager
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
## 2012-04-25 NOTE — Progress Notes (Signed)
[facility] INPATIENT:  Family/Significant Other Suicide Prevention Education  Suicide Prevention Education:  Education Completed; Patient's mother. Suzy Bouchard,   has been identified by the patient as the family member who will aid the patient in the event of a mental health crisis (suicidal ideations/suicide attempt).  With written consent from the patient, the family member/significant other has been provided the following suicide prevention education, prior to the and/or following the discharge of the patient.  The suicide prevention education provided includes the following:  Suicide risk factors  Suicide prevention and interventions  National Suicide Hotline telephone number  [facility] assessment telephone number  [facility] Emergency Assistance 911  [facility] and/or Residential Mobile Crisis Unit telephone number  Request made of family/significant other to:  Remove weapons (e.g., guns, rifles, knives), all items previously/currently identified as safety concern.    Remove drugs/medications (over-the-counter, prescriptions, illicit drugs), all items previously/currently identified as a safety concern.  The family member/significant other verbalizes understanding of the suicide prevention education information provided.  The family member/significant other agrees to remove the items of safety concern listed above.  Clide Dales 04/25/2012, 1:17 PM

## 2012-04-25 NOTE — Progress Notes (Signed)
D:  Madison Garza reports that she slept ok and that her appetite is poor.  She states that her depression is 9/10 and hopelessness is 8/10.  She reports a headache this am and some nausea.  The headache was unrelieved by naproxen, but the nausea improved with zofran.  She denies SI/HI/AVH, and states that she has a lot of anxiety and depression.  Vistaril given for anxiety with some relief.   A:  Emotional support provided.  Safety checks q 15 minutes.  Medications given as ordered. R:  Safety maintained on unit.

## 2012-04-25 NOTE — Progress Notes (Signed)
[facility] MD Progress Note  04/25/2012 12:29 PM Madison Garza  MRN:  [id number] Subjective:  Madison Garza endorses that she is having a lot of anxiety. Also endorses a headache, that is not going away. It seems tensional in nature with a vascular component. She is upset because her daughter could not visit due to the visitors restrictions because of the flu. Endorses that it "killed her,"  because she heard the daughter cry when she could not come to visit. Still very unstable, cant control the crying, wants to isolate, go to a corner and cry. Cant stop worrying Diagnosis:   Axis I: Polysubstance Dependence, Anxiety Disorder NOS, Depressive Disorder NOS Axis II: Deferred Axis III:  Past Medical History  Diagnosis Date  . Headache   . Depression    Axis IV: economic problems, housing problems, occupational problems, problems related to legal system/crime and problems with primary support group Axis V: 51-60 moderate symptoms  ADL's:  Intact  Sleep: Poor  Appetite:  Poor  Suicidal Ideation:  Plan:  Denies Intent:  denies Means:  Denies Homicidal Ideation:  Plan:  denies Intent:  denies Means:  denies AEB (as evidenced by):  Psychiatric Specialty Exam: Review of Systems  Constitutional: Positive for malaise/fatigue.  Eyes: Positive for photophobia and pain.  Respiratory: Negative.   Cardiovascular: Negative.   Gastrointestinal: Negative.   Genitourinary: Positive for dysuria.  Musculoskeletal: Positive for myalgias.  Skin: Negative.   Neurological: Positive for weakness and headaches.  Endo/Heme/Allergies: Negative.   Psychiatric/Behavioral: Positive for depression and substance abuse. The patient is nervous/anxious.     Blood pressure 101/80, pulse 80, temperature 98.9 F (37.2 C), temperature source Oral, resp. rate 16, height 5' 3.39" (1.61 m), weight 48.988 kg (108 lb), last menstrual period 04/08/2012.Body mass index is 18.90 kg/(m^2).  General Appearance: Fairly Groomed  Proofreader::  Fair  Speech:  Clear and Coherent and Normal Rate  Volume:  Normal  Mood:  Anxious, Depressed and worried  Affect:  Tearful and anxious, depressed, in pain  Thought Process:  Coherent and Goal Directed  Orientation:  Full (Time, Place, and Person)  Thought Content:  WDL, Rumination and worreis, concerns, shame, guilt  Suicidal Thoughts:  No  Homicidal Thoughts:  No  Memory:  Immediate;   Fair Recent;   Fair Remote;   Fair  Judgement:  Fair  Insight:  Present  Psychomotor Activity:  Restlessness  Concentration:  Fair  Recall:  Fair  Akathisia:  No  Handed:  Right  AIMS (if indicated):     Assets:  Desire for Improvement  Sleep:  Number of Hours: 6.75    Current Medications: Current Facility-Administered Medications  Medication Dose Route Frequency Provider Last Rate Last Dose  . acetaminophen (TYLENOL) tablet 650 mg  650 mg Oral Q6H PRN Cleotis Nipper, MD   650 mg at 04/24/12 0806  . alum & mag hydroxide-simeth (MAALOX/MYLANTA) 200-200-20 MG/5ML suspension 30 mL  30 mL Oral Q4H PRN Cleotis Nipper, MD      . butalbital-acetaminophen-caffeine (FIORICET, ESGIC) [phone] MG per tablet 2 tablet  2 tablet Oral Once Rachael Fee, MD      . chlordiazePOXIDE (LIBRIUM) capsule 25 mg  25 mg Oral Q6H PRN Cleotis Nipper, MD   25 mg at 04/24/12 1435  . chlordiazePOXIDE (LIBRIUM) capsule 25 mg  25 mg Oral TID Cleotis Nipper, MD       Followed by  . chlordiazePOXIDE (LIBRIUM) capsule 25 mg  25 mg Oral BH-qamhs  Cleotis Nipper, MD       Followed by  . chlordiazePOXIDE (LIBRIUM) capsule 25 mg  25 mg Oral Daily Cleotis Nipper, MD      . gabapentin (NEURONTIN) capsule 200 mg  200 mg Oral TID Rachael Fee, MD      . hydrOXYzine (ATARAX/VISTARIL) tablet 25 mg  25 mg Oral Q6H PRN Cleotis Nipper, MD   25 mg at 04/25/12 0825  . hydrOXYzine (ATARAX/VISTARIL) tablet 25 mg  25 mg Oral QHS PRN,MR X 1 Cleotis Nipper, MD   25 mg at 04/23/12 2209  . levofloxacin (LEVAQUIN) tablet 500 mg  500 mg Oral Daily  Kerry Hough, PA   500 mg at 04/25/12 6213  . loperamide (IMODIUM) capsule 2-4 mg  2-4 mg Oral PRN Cleotis Nipper, MD      . magnesium hydroxide (MILK OF MAGNESIA) suspension 30 mL  30 mL Oral Daily PRN Cleotis Nipper, MD      . multivitamin with minerals tablet 1 tablet  1 tablet Oral Daily Cleotis Nipper, MD   1 tablet at 04/25/12 0865  . naproxen (NAPROSYN) tablet 500 mg  500 mg Oral BID PRN Rachael Fee, MD   500 mg at 04/25/12 0825  . nicotine (NICODERM CQ - dosed in mg/24 hours) patch 21 mg  21 mg Transdermal Q0600 Cleotis Nipper, MD   21 mg at 04/25/12 7846  . ondansetron (ZOFRAN-ODT) disintegrating tablet 4 mg  4 mg Oral Q6H PRN Cleotis Nipper, MD   4 mg at 04/25/12 0925  . thiamine (B-1) injection 100 mg  100 mg Intramuscular Once Cleotis Nipper, MD      . thiamine (VITAMIN B-1) tablet 100 mg  100 mg Oral Daily Cleotis Nipper, MD   100 mg at 04/25/12 9629  . traZODone (DESYREL) tablet 50 mg  50 mg Oral QHS,MR X 1 Kerry Hough, PA   50 mg at 04/24/12 2142    Lab Results: No results found for this or any previous visit (from the past 48 hour(s)).  Physical Findings: AIMS: Facial and Oral Movements Muscles of Facial Expression: None, normal Lips and Perioral Area: None, normal Jaw: None, normal Tongue: None, normal,Extremity Movements Upper (arms, wrists, hands, fingers): None, normal Lower (legs, knees, ankles, toes): None, normal, Trunk Movements Neck, shoulders, hips: None, normal, Overall Severity Severity of abnormal movements (highest score from questions above): None, normal Incapacitation due to abnormal movements: None, normal Patient's awareness of abnormal movements (rate only patient's report): No Awareness, Dental Status Current problems with teeth and/or dentures?: No Does patient usually wear dentures?: No  CIWA:  CIWA-Ar Total: 3  COWS:  COWS Total Score: 4   Treatment Plan Summary: Daily contact with patient to assess and evaluate symptoms and progress in  treatment Medication management  Plan: Supportive approach/coping skills/relapse prevention           Fioricet two now for the headache           Neurontin 200 mg TID           Pursue the Detox  Medical Decision Making Problem Points:  Established problem, worsening (2), New problem, with no additional work-up planned (3), Review of last therapy session (1) and Review of psycho-social stressors (1) Data Points:  Review of medication regiment & side effects (2) Review of new medications or change in dosage (2)  I certify that inpatient services furnished can reasonably be expected to  improve the patient's condition.   Karema Tocci A 04/25/2012, 12:29 PM

## 2012-04-25 NOTE — ED Provider Notes (Signed)
Medical screening examination/treatment/procedure(s) were performed by non-physician practitioner and as supervising physician I was immediately available for consultation/collaboration.  Nafeesah Lapaglia R Adalae Baysinger, MD 04/25/12 0037 

## 2012-04-25 NOTE — Progress Notes (Signed)
Patient did attend the evening karaoke group. Pt was engaged, supportive, and participated by singing multiple songs.   

## 2012-04-26 MED ORDER — METHOCARBAMOL 500 MG PO TABS
500.0000 mg | ORAL_TABLET | Freq: Three times a day (TID) | ORAL | Status: DC | PRN
  Administered 2012-04-26 – 2012-04-30 (×6): 500 mg via ORAL
  Filled 2012-04-26 (×4): qty 1

## 2012-04-26 MED ORDER — KETOROLAC TROMETHAMINE 30 MG/ML IJ SOLN
30.0000 mg | Freq: Once | INTRAMUSCULAR | Status: AC
  Administered 2012-04-26: 30 mg via INTRAVENOUS
  Filled 2012-04-26 (×2): qty 1

## 2012-04-26 MED ORDER — ASPIRIN-ACETAMINOPHEN-CAFFEINE 250-250-65 MG PO TABS
2.0000 | ORAL_TABLET | Freq: Four times a day (QID) | ORAL | Status: DC | PRN
  Administered 2012-04-27: 2 via ORAL
  Filled 2012-04-26: qty 2

## 2012-04-26 MED ORDER — BOOST / RESOURCE BREEZE PO LIQD
1.0000 | Freq: Two times a day (BID) | ORAL | Status: DC
  Administered 2012-04-26 – 2012-04-30 (×7): 1 via ORAL
  Filled 2012-04-26 (×14): qty 1

## 2012-04-26 MED ORDER — GABAPENTIN 300 MG PO CAPS
300.0000 mg | ORAL_CAPSULE | Freq: Three times a day (TID) | ORAL | Status: DC
  Administered 2012-04-26 – 2012-04-30 (×11): 300 mg via ORAL
  Filled 2012-04-26 (×14): qty 1

## 2012-04-26 MED ORDER — METHOCARBAMOL 500 MG PO TABS
500.0000 mg | ORAL_TABLET | Freq: Once | ORAL | Status: AC
  Administered 2012-04-26: 500 mg via ORAL
  Filled 2012-04-26 (×2): qty 1

## 2012-04-26 MED ORDER — TRAZODONE HCL 100 MG PO TABS
100.0000 mg | ORAL_TABLET | Freq: Every evening | ORAL | Status: DC | PRN
  Administered 2012-04-26 – 2012-04-28 (×6): 100 mg via ORAL
  Filled 2012-04-26 (×8): qty 1

## 2012-04-26 NOTE — Progress Notes (Signed)
Psychoeducational Group Note  Date:  04/26/2012 Time:  2000  Group Topic/Focus:  AA group  Participation Level:  Active  Participation Quality:  Appropriate  Affect:  Appropriate  Cognitive:  Alert  Insight:  Engaged  Engagement in Group:  Engaged  Additional Comments:    Madison Garza R 04/26/2012, 9:59 PM

## 2012-04-26 NOTE — Progress Notes (Signed)
D   Pt is anxious and depressed   She interacts well with select peers and is compliant with treatment   She denies suicidal ideation at present and reports improved mood since first entering the hospital A   Verbal support given  Medications administered and effectiveness monitored  Q 15 min checks R   Pt safe at present

## 2012-04-26 NOTE — Clinical Social Work Note (Signed)
[facility] Group Notes:  (Counselor/Nursing/MHT/Case Management/Adjunct)  04/26/2012 7:56 PM  Type of Therapy:  Group Therapy at 1:15 to 2:30  Participation Level:  Minimal  Participation Quality:  Attentive  Affect:  Depressed and Flat  Cognitive:  Alert and Oriented  Insight:  None shared  Engagement in Group:  Lacking  Engagement in Therapy:  None noted  Modes of Intervention:  Education, Electronics engineer, Paediatric nurse of Progress/Problems: Group session included an educational portion on Post Acute Withdrawal Syndrome (PAWS) and a processing portion on feelings about relapse and what, if anything, is the motive for recovery.  Zarrah left the group early before sharing.    Clide Dales

## 2012-04-26 NOTE — Clinical Social Work Note (Signed)
Interdisciplinary Treatment Plan Update (Adult)  Date: 04/26/2012  Time Reviewed: 9:19 AM   Progress in Treatment: Attending groups: Yes Participating in groups: Yes Taking medication as prescribed:  Yes Tolerating medication:  Yes Family/Significant othe contact made: Yes Patient understands diagnosis: Yes Discussing patient identified problems/goals with staff: Yes Medical problems stabilized or resolved:  Yes Denies suicidal/homicidal ideation: Yes Issues/concerns per patient self-inventory: Multiple issues related to abdominal pain, headaches, etc Other: N/A  New problem(s) identified: None Identified  Reason for Continuation of Hospitalization: Medical Issues Medication stabilization Withdrawal symptoms  Interventions implemented related to continuation of hospitalization: mood stabilization, medication monitoring and adjustment, group therapy and psycho education, suicide risk assessment, collateral contact, aftercare planning, ongoing physician assessments and safety checks q 15 mins  Additional comments: N/A  Estimated length of stay: 5-7 days  Discharge Plan:  CSW is assessing for appropriate referrals.   New goal(s): N/A  Review of initial/current patient goals per problem list:    1. Goal(s): Address suicidal ideation  Met: No  Target date: DC 3. Goal (s): Reduce depressive symptoms from a 10 to a 3  Met: No  Target date: DC 4.  Complete Detox Protocol and set up followup  Met:  No  Target date: DC  Attendees: Patient:     Family:     Physician:  Geoffery Lyons 04/26/2012 9:19 AM   Nursing:   Shelda Jakes, RN 04/26/2012 9:19 AM   Clinical Social Worker Ronda Fairly 04/26/2012 9:19 AM   Other:  Armandina Stammer. NP 04/26/2012 9:19 AM   Other:   04/26/2012 9:19 AM   Other:   04/26/2012 9:19 AM   Other:   04/26/2012 9:19 AM    Scribe for Treatment Team:   Carney Bern, LCSWA  04/26/2012 9:19 AM

## 2012-04-26 NOTE — Progress Notes (Signed)
Madison Garza has had a hard day today. She has been focused on her headache, she was given a dose of fioricet at 0831, for which she stated  No  relief. Her fioricet was DC'Madison and she was given a one time dose of toradol 30 mg IM, at 1530 and she stated " much better" relief after this IM. She was also given , at the same  Time a prn dose of librium 25 mg and robaxin 500 mg. Her neurontin was increased to 300 mg tid also.    A She maintains a hih level of constant anxiety and openly admits that she has a low tolerance for emotional as well as  Physical discomfort.She is started on resourse, for nutritional supplement , per her request ( she says she is not able to eat much now).   R Safety is in place and poc cont.

## 2012-04-26 NOTE — Progress Notes (Signed)
Nutrition Brief Note  Pt meets criteria for severe MALNUTRITION in the context of acute illness as evidenced by <50% estimated energy intake with 15% weight loss in the past 2 weeks per pt report.  Patient identified on the Malnutrition Screening Tool (MST) Report  Body mass index is 18.90 kg/(m^2). Patient meets criteria for normal weight based on current BMI.   Pt reports poor appetite for the past 2 weeks with 18 pound unintended weight loss. Pt states during this time she was eating very little, just small amounts of food. Pt reports still not eating that much at mealtimes during admission. Pt does not like Ensure but is willing to try [employer]. Encouraged gradually increasing her intake to improve her nutritional status. Pt expressed understanding.   Levon Hedger MS, RD, LDN [phone] Pager [phone] After Hours Pager

## 2012-04-26 NOTE — Clinical Social Work Note (Signed)
[facility] LCSW Group Therapy   Type of Therapy:  Discharge Planning  Participation Level:  Did Not Attend     Clide Dales 04/26/2012 9:20 AM

## 2012-04-26 NOTE — Clinical Social Work Note (Signed)
[facility] LCSW Group Therapy   Type of Therapy:  Discharge Planning  Participation Level:  Did Not Attend   Clide Dales 04/26/2012 7:56 PM

## 2012-04-26 NOTE — Progress Notes (Signed)
D.  Pt pleasant on approach, requested muscle relaxer before bed because she states that she does not want to wake up with a headache.  Positive for evening AA group, interacting appropriately within milieu.  Denies SI/HI/hallucinations at this this time.  A.  Support and encouragement offered, medications given as ordered  R.  No acute distress, will continue to monitor

## 2012-04-26 NOTE — Progress Notes (Signed)
[facility] MD Progress Note  04/26/2012 4:14 PM Madison Garza  MRN:  [id number] Subjective:  Madison Garza continues to have a hard time. She is still endorsing pain, headache, anxiety. Her mother came to visit  and her visit increased her anxiety. She is very overwhelmed with everything he has to face once she gets out of here. She builds herself to have a lot of anxiety. Says she cant help it. She used the Fioricet few times and was effective short period of time. She right now does not have a stable place to live, worried that they are going to take her kids form her. Diagnosis:  Polysubstance Dependence, Mood Disorder NOS, Anxiety Disorder NOS  ADL's:  Intact  Sleep: Poor  Appetite:  Poor  Suicidal Ideation:  Plan:  Denies Intent:  Denies Means:  Denies Homicidal Ideation:  Plan:  Denies Intent:  Denies Means:  denies AEB (as evidenced by):  Psychiatric Specialty Exam: Review of Systems  Constitutional: Positive for malaise/fatigue.  HENT: Positive for neck pain.   Eyes: Positive for pain.  Respiratory: Negative.   Cardiovascular: Negative.   Gastrointestinal: Negative.   Genitourinary: Negative.   Musculoskeletal: Positive for myalgias.  Skin: Negative.   Neurological: Positive for weakness and headaches.  Endo/Heme/Allergies: Negative.   Psychiatric/Behavioral: Positive for depression and substance abuse. The patient is nervous/anxious.     Blood pressure 93/59, pulse 104, temperature 97.5 F (36.4 C), temperature source Oral, resp. rate 18, height 5' 3.39" (1.61 m), weight 48.988 kg (108 lb), last menstrual period 04/08/2012.Body mass index is 18.90 kg/(m^2).  General Appearance: Fairly Groomed  Patent attorney::  Fair  Speech:  Clear and Coherent and Slow  Volume:  Decreased  Mood:  Anxious, Depressed and in pain  Affect:  anxious, worried, depressed  Thought Process:  Coherent and Goal Directed  Orientation:  Full (Time, Place, and Person)  Thought Content:  worries, concerns,  somatically focused  Suicidal Thoughts:  No  Homicidal Thoughts:  No  Memory:  Immediate;   Fair Recent;   Fair Remote;   Fair  Judgement:  Fair  Insight:  Present  Psychomotor Activity:  Decreased  Concentration:  Fair  Recall:  Fair  Akathisia:  No  Handed:  Right  AIMS (if indicated):     Assets:  Desire for Improvement  Sleep:  Number of Hours: 5.25    Current Medications: Current Facility-Administered Medications  Medication Dose Route Frequency Provider Last Rate Last Dose  . acetaminophen (TYLENOL) tablet 650 mg  650 mg Oral Q6H PRN Cleotis Nipper, MD   650 mg at 04/24/12 0806  . alum & mag hydroxide-simeth (MAALOX/MYLANTA) 200-200-20 MG/5ML suspension 30 mL  30 mL Oral Q4H PRN Cleotis Nipper, MD      . aspirin-acetaminophen-caffeine (EXCEDRIN MIGRAINE) per tablet 2 tablet  2 tablet Oral Q6H PRN Rachael Fee, MD      . chlordiazePOXIDE (LIBRIUM) capsule 25 mg  25 mg Oral Q6H PRN Cleotis Nipper, MD   25 mg at 04/26/12 1530  . chlordiazePOXIDE (LIBRIUM) capsule 25 mg  25 mg Oral BH-qamhs Cleotis Nipper, MD       Followed by  . chlordiazePOXIDE (LIBRIUM) capsule 25 mg  25 mg Oral Daily Cleotis Nipper, MD      . feeding supplement (RESOURCE BREEZE) liquid 1 Container  1 Container Oral BID BM Lavena Bullion, RD      . gabapentin (NEURONTIN) capsule 300 mg  300 mg Oral TID Reymundo Poll  Dub Mikes, MD      . hydrOXYzine (ATARAX/VISTARIL) tablet 25 mg  25 mg Oral Q6H PRN Cleotis Nipper, MD   25 mg at 04/25/12 0825  . hydrOXYzine (ATARAX/VISTARIL) tablet 25 mg  25 mg Oral QHS PRN,MR X 1 Cleotis Nipper, MD   25 mg at 04/25/12 2326  . lidocaine (LIDODERM) 5 % 1 patch  1 patch Transdermal Daily Rachael Fee, MD   1 patch at 04/26/12 [phone]  . loperamide (IMODIUM) capsule 2-4 mg  2-4 mg Oral PRN Cleotis Nipper, MD      . magnesium hydroxide (MILK OF MAGNESIA) suspension 30 mL  30 mL Oral Daily PRN Cleotis Nipper, MD      . methocarbamol (ROBAXIN) tablet 500 mg  500 mg Oral Q8H PRN Rachael Fee, MD   500  mg at 04/26/12 1530  . multivitamin with minerals tablet 1 tablet  1 tablet Oral Daily Cleotis Nipper, MD   1 tablet at 04/26/12 0857  . naproxen (NAPROSYN) tablet 500 mg  500 mg Oral BID PRN Rachael Fee, MD   500 mg at 04/25/12 0825  . nicotine (NICODERM CQ - dosed in mg/24 hours) patch 21 mg  21 mg Transdermal Q0600 Cleotis Nipper, MD   21 mg at 04/26/12 0616  . ondansetron (ZOFRAN-ODT) disintegrating tablet 4 mg  4 mg Oral Q6H PRN Cleotis Nipper, MD   4 mg at 04/25/12 0925  . thiamine (B-1) injection 100 mg  100 mg Intramuscular Once Cleotis Nipper, MD      . thiamine (VITAMIN B-1) tablet 100 mg  100 mg Oral Daily Cleotis Nipper, MD   100 mg at 04/26/12 0857  . traZODone (DESYREL) tablet 100 mg  100 mg Oral QHS,MR X 1 Rachael Fee, MD        Lab Results: No results found for this or any previous visit (from the past 48 hour(s)).  Physical Findings: AIMS: Facial and Oral Movements Muscles of Facial Expression: None, normal Lips and Perioral Area: None, normal Jaw: None, normal Tongue: None, normal,Extremity Movements Upper (arms, wrists, hands, fingers): None, normal Lower (legs, knees, ankles, toes): None, normal, Trunk Movements Neck, shoulders, hips: None, normal, Overall Severity Severity of abnormal movements (highest score from questions above): None, normal Incapacitation due to abnormal movements: None, normal Patient's awareness of abnormal movements (rate only patient's report): No Awareness, Dental Status Current problems with teeth and/or dentures?: No Does patient usually wear dentures?: No  CIWA:  CIWA-Ar Total: 8  COWS:  COWS Total Score: 4   Treatment Plan Summary: Daily contact with patient to assess and evaluate symptoms and progress in treatment Medication management  Plan: Supportive approach/coping skills/relapse prevention           Complete the Detox           Increase the Trazodone to 100 mg HS PRN           Pain Management: Toradol IM, Excedrin migraine,  Robaxin            Medical Decision Making Problem Points:  Established problem, worsening (2), Review of last therapy session (1) and Review of psycho-social stressors (1) Data Points:  Review of medication regiment & side effects (2) Review of new medications or change in dosage (2)  I certify that inpatient services furnished can reasonably be expected to improve the patient's condition.   Justis Closser A 04/26/2012, 4:14 PM

## 2012-04-27 LAB — URINALYSIS, ROUTINE W REFLEX MICROSCOPIC
Bilirubin Urine: NEGATIVE
Leukocytes, UA: NEGATIVE
Nitrite: NEGATIVE
Specific Gravity, Urine: 1.025 (ref 1.005–1.030)
Urobilinogen, UA: 0.2 mg/dL (ref 0.0–1.0)

## 2012-04-27 MED ORDER — LIDOCAINE 5 % EX PTCH
2.0000 | MEDICATED_PATCH | Freq: Every day | CUTANEOUS | Status: DC
  Administered 2012-04-28 – 2012-05-01 (×4): 2 via TRANSDERMAL
  Filled 2012-04-27 (×6): qty 2
  Filled 2012-04-27: qty 14

## 2012-04-27 MED ORDER — ISOMETHEPTENE-APAP-DICHLORAL 65-325-100 MG PO CAPS
2.0000 | ORAL_CAPSULE | ORAL | Status: DC | PRN
  Administered 2012-04-27 – 2012-05-01 (×9): 2 via ORAL
  Filled 2012-04-27 (×10): qty 2

## 2012-04-27 NOTE — Progress Notes (Signed)
[name] cont to be somatically focused...wanting a different pill every hour to " make me feel better". SHe has no insight. SHe has poor self esteem. She completed her self inventory and on it she writes she denies SI within the past 24 hrs, she rated her depression and hopelessness" 9/9 " and stated her DC plan is to " make better decisions".   A She requests a 2nd lidoderm patch for her left neck ( she is currently wearing a patch for her right neck )  And this order is obtained from PA. At [id number], she requested and was given  500 mg robaxin for " the pain I feel" and then she requested and was given 2 excedrin strength migraine, for contd complaints of headache. After she spoke with PA, she was ordered midrin and requested and was given 2 midrin at 1453 and stated " some" releif afterwards.  R Safety is in place and POC cont'd.

## 2012-04-27 NOTE — Progress Notes (Addendum)
[facility] MD Progress Note  04/27/2012 1:10 PM ANTHONIA MONGER  MRN:  [id number] Subjective: Focused on her physical complaints. 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 Tried to introduce the concept of rebound headache - but she just wants pills. Will order Midrin.  Diagnosis:  Polysubstance Dependence, Mood Disorder NOS, Anxiety Disorder NOS  ADL's:  Intact  Sleep: Poor  Appetite:  Poor  Suicidal Ideation:  Plan:  Denies Intent:  Denies Means:  Denies Homicidal Ideation:  Plan:  Denies Intent:  Denies Means:  denies AEB (as evidenced by):  Psychiatric Specialty Exam: Review of Systems  Constitutional: Positive for malaise/fatigue.  HENT: Positive for neck pain.   Eyes: Positive for pain.  Respiratory: Negative.   Cardiovascular: Negative.   Gastrointestinal: Negative.   Genitourinary: Negative.   Musculoskeletal: Positive for myalgias.  Skin: Negative.   Neurological: Positive for weakness and headaches.  Endo/Heme/Allergies: Negative.   Psychiatric/Behavioral: Positive for depression and substance abuse. The patient is nervous/anxious.     Blood pressure 63/38, pulse 77, temperature 97.5 F (36.4 C), temperature source Oral, resp. rate 17, height 5' 3.39" (1.61 m), weight 48.988 kg (108 lb), last menstrual period 04/08/2012.Body mass index is 18.90 kg/(m^2).  General Appearance: Fairly Groomed  Patent attorney::  Fair  Speech:  Clear and Coherent and Slow  Volume:  Decreased  Mood:  Anxious, Depressed and in pain  Affect:  anxious, worried, depressed  Thought Process:  Coherent and Goal Directed  Orientation:  Full (Time, Place, and Person)  Thought Content:  worries, concerns, somatically focused  Suicidal Thoughts:  No  Homicidal Thoughts:  No  Memory:  Immediate;   Fair Recent;   Fair Remote;   Fair  Judgement:  Fair  Insight:  Present  Psychomotor Activity:  Decreased    Concentration:  Fair  Recall:  Fair  Akathisia:  No  Handed:  Right  AIMS (if indicated):     Assets:  Desire for Improvement  Sleep:  Number of Hours: 6.5    Current Medications: Current Facility-Administered Medications  Medication Dose Route Frequency Provider Last Rate Last Dose  . acetaminophen (TYLENOL) tablet 650 mg  650 mg Oral Q6H PRN Cleotis Nipper, MD   650 mg at 04/24/12 0806  . alum & mag hydroxide-simeth (MAALOX/MYLANTA) 200-200-20 MG/5ML suspension 30 mL  30 mL Oral Q4H PRN Cleotis Nipper, MD      . aspirin-acetaminophen-caffeine [facility] MIGRAINE) per tablet 2 tablet  2 tablet Oral Q6H PRN Rachael Fee, MD   2 tablet at 04/27/12 [phone]  . chlordiazePOXIDE (LIBRIUM) capsule 25 mg  25 mg Oral Daily Cleotis Nipper, MD      . feeding supplement (RESOURCE BREEZE) liquid 1 Container  1 Container Oral BID BM Lavena Bullion, RD   1 Container at 04/26/12 1713  . gabapentin (NEURONTIN) capsule 300 mg  300 mg Oral TID Rachael Fee, MD   300 mg at 04/27/12 1145  . hydrOXYzine (ATARAX/VISTARIL) tablet 25 mg  25 mg Oral QHS PRN,MR X 1 Cleotis Nipper, MD   25 mg at 04/26/12 2234  . lidocaine (LIDODERM) 5 % 1 patch  1 patch Transdermal Daily Rachael Fee, MD   1 patch at 04/27/12 [phone]  . magnesium hydroxide (MILK OF MAGNESIA) suspension 30 mL  30 mL Oral Daily PRN Cleotis Nipper, MD      . methocarbamol (ROBAXIN) tablet 500  mg  500 mg Oral Q8H PRN Rachael Fee, MD   500 mg at 04/27/12 4401  . multivitamin with minerals tablet 1 tablet  1 tablet Oral Daily Cleotis Nipper, MD   1 tablet at 04/27/12 0272  . naproxen (NAPROSYN) tablet 500 mg  500 mg Oral BID PRN Rachael Fee, MD   500 mg at 04/26/12 2127  . nicotine (NICODERM CQ - dosed in mg/24 hours) patch 21 mg  21 mg Transdermal Q0600 Cleotis Nipper, MD   21 mg at 04/27/12 0605  . thiamine (B-1) injection 100 mg  100 mg Intramuscular Once Cleotis Nipper, MD      . thiamine (VITAMIN B-1) tablet 100 mg  100 mg Oral Daily Cleotis Nipper, MD   100  mg at 04/27/12 0833  . traZODone (DESYREL) tablet 100 mg  100 mg Oral QHS,MR X 1 Rachael Fee, MD   100 mg at 04/26/12 2234    Lab Results:  Results for orders placed during the hospital encounter of 04/23/12 (from the past 48 hour(s))  URINALYSIS, ROUTINE W REFLEX MICROSCOPIC     Status: Abnormal   Collection Time   04/26/12 11:02 PM      Component Value Range Comment   Color, Urine YELLOW  YELLOW    APPearance CLEAR  CLEAR    Specific Gravity, Urine 1.025  1.005 - 1.030    pH 5.5  5.0 - 8.0    Glucose, UA NEGATIVE  NEGATIVE mg/dL    Hgb urine dipstick NEGATIVE  NEGATIVE    Bilirubin Urine NEGATIVE  NEGATIVE    Ketones, ur TRACE (*) NEGATIVE mg/dL    Protein, ur NEGATIVE  NEGATIVE mg/dL    Urobilinogen, UA 0.2  0.0 - 1.0 mg/dL    Nitrite NEGATIVE  NEGATIVE    Leukocytes, UA NEGATIVE  NEGATIVE MICROSCOPIC NOT DONE ON URINES WITH NEGATIVE PROTEIN, BLOOD, LEUKOCYTES, NITRITE, OR GLUCOSE <1000 mg/dL.    Physical Findings: AIMS: Facial and Oral Movements Muscles of Facial Expression: None, normal Lips and Perioral Area: None, normal Jaw: None, normal Tongue: None, normal,Extremity Movements Upper (arms, wrists, hands, fingers): None, normal Lower (legs, knees, ankles, toes): None, normal, Trunk Movements Neck, shoulders, hips: None, normal, Overall Severity Severity of abnormal movements (highest score from questions above): None, normal Incapacitation due to abnormal movements: None, normal Patient's awareness of abnormal movements (rate only patient's report): No Awareness, Dental Status Current problems with teeth and/or dentures?: No Does patient usually wear dentures?: No  CIWA:  CIWA-Ar Total: 3  COWS:  COWS Total Score: 4   Treatment Plan Summary: Daily contact with patient to assess and evaluate symptoms and progress in treatment Medication management  Plan: Supportive approach/coping skills/relapse prevention           Complete the Detox           Increase the  Trazodone to 100 mg HS PRN           Pain Management: Toradol IM, Excedrin migraine, Robaxin            Medical Decision Making Problem Points:  Established problem, worsening (2), Review of last therapy session (1) and Review of psycho-social stressors (1) Data Points:  Review of medication regiment & side effects (2) Review of new medications or change in dosage (2)  I certify that inpatient services furnished can reasonably be expected to improve the patient's condition.   Felton Buczynski,MICKIE D. RPA-C CAQ-Psych  04/27/2012, 1:10 PM

## 2012-04-27 NOTE — Clinical Social Work Psychosocial (Signed)
[facility] Group Notes:  (Clinical Social Work)  04/27/2012  10:00-11:00AM  Summary of Progress/Problems:   The main focus of today's process group was for the patient to identify ways in which they have in the past sabotaged their own recovery and what "good" they have received from drug/alcohol use such as overcoming shyness, forgetting pain, or eliminating worries.  We talked about what the "less good" results, as well as how motivated they are to make a change. The patient expressed that she is an introvert, and using makes her more talkataive, more able to communicate with people.  She states that she is 5-10% motivate to change, that she is afraid of change because she does feel so good when she uses; however, she has lost her children and her home and needs to get them back.  Type of Therapy:  Group Therapy - Process - Motivational Interviewing  Participation Level:  Active  Participation Quality:  Appropriate, Attentive and Sharing  Affect:  Depressed and Tearful  Cognitive:  Appropriate  Insight:  Engaged  Engagement in Therapy:  Engaged  Modes of Intervention:  Education, Teacher, English as a foreign language, Exploration, Discussion   Ambrose Mantle, LCSW 04/27/2012, 11:10 AM

## 2012-04-27 NOTE — Progress Notes (Signed)
D.  Pt pleasant on approach.  Requested Midrin for headache pain.  Denies SI/HI/hallucinations at this time.  Positive for evening group, interacting appropriately within milieu.  A.  Support and encouragement offered, medications given as ordered.  R.  No acute distress, will continue to monitor.

## 2012-04-27 NOTE — Progress Notes (Signed)
Psychoeducational Group Note  Date:  04/27/2012 Time:  1330  Group Topic/Focus:  Identifying Needs:   The focus of this group is to help patients identify their personal needs that have been historically problematic and identify healthy behaviors to address their needs.  Participation Level:  Active  Participation Quality: good  Affect: flat  Cognitive:  good  Insight:  good  Engagement in Group: engaged  Additional Comments:   PD RN [facility]

## 2012-04-28 NOTE — Progress Notes (Signed)
Late entry for 04-27-12 GOALS GROUP   Date: 04/28/2012 Time:  1015  Group Topic/Focus:  GOALS GROUP. This group helps patients learn the way to set a goal that is attainable, and can be assessed at the end of the day.  Participation Level:  Active  Participation Quality:  Appropriate  Affect:  Appropriate  Cognitive:  Alert  Insight:  Engaged  Engagement in Group:  Engaged  Additional Comments:    Madison Garza A  

## 2012-04-28 NOTE — Progress Notes (Signed)
Date: 04/28/2012  Time: 1015  Group Topic/Focus:  Identifying Needs: The focus of this group is to reflect on some of the things we are grateful in our lives.  Participation Level: Did Not Attend  Additional Comments:  Markesha Hannig A   

## 2012-04-28 NOTE — Clinical Social Work Psychosocial (Signed)
[facility] Group Notes:  (Clinical Social Work)  04/28/2012  10:00-11:00AM  Summary of Progress/Problems:   The main focus of today's process group was for the patient to define "support" and describe what healthy supports are, then to identify the patient's current support system and decide on other supports that can be put in place to prevent future hospitalizations.   An emphasis was placed on using therapist, doctor and problem-specific support groups to expand supports.  The patient came in late, expressed that she sometimes smells and even tastes crack, makes her want to use again.  Type of Therapy:  Process Group  Participation Level:  Active  Participation Quality:  Attentive and Sharing  Affect:  Depressed, Flat and Tearful  Cognitive:  Appropriate and Oriented  Insight:  Engaged  Engagement in Therapy:  Engaged  Modes of Intervention:  Clarification, Education, Limit-setting, Problem-solving, Socialization, Support and Processing, Exploration, Discussion, Role-Play   Ambrose Mantle, LCSW 04/28/2012, 12:36 PM

## 2012-04-28 NOTE — Progress Notes (Signed)
Patient did attend the evening speaker AA meeting.  

## 2012-04-28 NOTE — Progress Notes (Signed)
Pt pleasant on approach, denies complaints other than some continued anxiety.  Positive for evening AA group.  Interacting appropriately within milieu.  Denies SI/HI/hallucinations at this time.  A.  Support and encouragement offered  R.  Will continue to monitor.

## 2012-04-28 NOTE — Progress Notes (Addendum)
[facility] MD Progress Note  04/28/2012 11:11 AM Madison Garza  MRN:  [id number] Subjective: Somewhat less needy today. Not really happy that her urine was negative.Spent the morning in bed resting.   Diagnosis:  Polysubstance Dependence, Mood Disorder NOS, Anxiety Disorder NOS  ADL's:  Intact  Sleep: Poor  Appetite:  Poor  Suicidal Ideation:  Plan:  Denies Intent:  Denies Means:  Denies Homicidal Ideation:  Plan:  Denies Intent:  Denies Means:  denies AEB (as evidenced by):  Psychiatric Specialty Exam: Review of Systems  Constitutional: Positive for malaise/fatigue.  HENT: Positive for neck pain.   Eyes: Positive for pain.  Respiratory: Negative.   Cardiovascular: Negative.   Gastrointestinal: Negative.   Genitourinary: Negative.   Musculoskeletal: Positive for myalgias.  Skin: Negative.   Neurological: Positive for weakness and headaches.  Endo/Heme/Allergies: Negative.   Psychiatric/Behavioral: Positive for depression and substance abuse. The patient is nervous/anxious.     Blood pressure 92/63, pulse 96, temperature 98.7 F (37.1 C), temperature source Oral, resp. rate 16, height 5' 3.39" (1.61 m), weight 48.988 kg (108 lb), last menstrual period 04/08/2012.Body mass index is 18.90 kg/(m^2).  General Appearance: Fairly Groomed  Patent attorney::  Fair  Speech:  Clear and Coherent and Slow  Volume:  Decreased  Mood:  Anxious, Depressed and in pain  Affect:  anxious, worried, depressed  Thought Process:  Coherent and Goal Directed  Orientation:  Full (Time, Place, and Person)  Thought Content:  worries, concerns, somatically focused  Suicidal Thoughts:  No  Homicidal Thoughts:  No  Memory:  Immediate;   Fair Recent;   Fair Remote;   Fair  Judgement:  Fair  Insight:  Present  Psychomotor Activity:  Decreased  Concentration:  Fair  Recall:  Fair  Akathisia:  No  Handed:  Right  AIMS (if indicated):     Assets:  Desire for Improvement  Sleep:  Number of Hours: 6.5     Current Medications: Current Facility-Administered Medications  Medication Dose Route Frequency Provider Last Rate Last Dose  . acetaminophen (TYLENOL) tablet 650 mg  650 mg Oral Q6H PRN Cleotis Nipper, MD   650 mg at 04/24/12 0806  . alum & mag hydroxide-simeth (MAALOX/MYLANTA) 200-200-20 MG/5ML suspension 30 mL  30 mL Oral Q4H PRN Cleotis Nipper, MD      . aspirin-acetaminophen-caffeine [facility] MIGRAINE) per tablet 2 tablet  2 tablet Oral Q6H PRN Rachael Fee, MD   2 tablet at 04/27/12 [phone]  . feeding supplement (RESOURCE BREEZE) liquid 1 Container  1 Container Oral BID BM Lavena Bullion, RD   1 Container at 04/27/12 1453  . gabapentin (NEURONTIN) capsule 300 mg  300 mg Oral TID Rachael Fee, MD   300 mg at 04/28/12 1037  . hydrOXYzine (ATARAX/VISTARIL) tablet 25 mg  25 mg Oral QHS PRN,MR X 1 Cleotis Nipper, MD   25 mg at 04/27/12 2147  . isometheptene-acetaminophen-dichloralphenazone (MIDRIN) capsule 2 capsule  2 capsule Oral Q4H PRN Mickie D. Kelcy Laible, PA   2 capsule at 04/28/12 1039  . lidocaine (LIDODERM) 5 % 2 patch  2 patch Transdermal Daily Mickie D. Boleslaus Holloway, PA   2 patch at 04/28/12 1036  . magnesium hydroxide (MILK OF MAGNESIA) suspension 30 mL  30 mL Oral Daily PRN Cleotis Nipper, MD      . methocarbamol (ROBAXIN) tablet 500 mg  500 mg Oral Q8H PRN Rachael Fee, MD   500 mg at 04/27/12 2147  . multivitamin with  minerals tablet 1 tablet  1 tablet Oral Daily Cleotis Nipper, MD   1 tablet at 04/28/12 1037  . naproxen (NAPROSYN) tablet 500 mg  500 mg Oral BID PRN Rachael Fee, MD   500 mg at 04/26/12 2127  . nicotine (NICODERM CQ - dosed in mg/24 hours) patch 21 mg  21 mg Transdermal Q0600 Cleotis Nipper, MD   21 mg at 04/28/12 0608  . thiamine (B-1) injection 100 mg  100 mg Intramuscular Once Cleotis Nipper, MD      . thiamine (VITAMIN B-1) tablet 100 mg  100 mg Oral Daily Cleotis Nipper, MD   100 mg at 04/28/12 1037  . traZODone (DESYREL) tablet 100 mg  100 mg Oral QHS,MR X 1 Rachael Fee, MD   100 mg at 04/27/12 2248    Lab Results:  Results for orders placed during the hospital encounter of 04/23/12 (from the past 48 hour(s))  URINALYSIS, ROUTINE W REFLEX MICROSCOPIC     Status: Abnormal   Collection Time   04/26/12 11:02 PM      Component Value Range Comment   Color, Urine YELLOW  YELLOW    APPearance CLEAR  CLEAR    Specific Gravity, Urine 1.025  1.005 - 1.030    pH 5.5  5.0 - 8.0    Glucose, UA NEGATIVE  NEGATIVE mg/dL    Hgb urine dipstick NEGATIVE  NEGATIVE    Bilirubin Urine NEGATIVE  NEGATIVE    Ketones, ur TRACE (*) NEGATIVE mg/dL    Protein, ur NEGATIVE  NEGATIVE mg/dL    Urobilinogen, UA 0.2  0.0 - 1.0 mg/dL    Nitrite NEGATIVE  NEGATIVE    Leukocytes, UA NEGATIVE  NEGATIVE MICROSCOPIC NOT DONE ON URINES WITH NEGATIVE PROTEIN, BLOOD, LEUKOCYTES, NITRITE, OR GLUCOSE <1000 mg/dL.    Physical Findings: AIMS: Facial and Oral Movements Muscles of Facial Expression: None, normal Lips and Perioral Area: None, normal Jaw: None, normal Tongue: None, normal,Extremity Movements Upper (arms, wrists, hands, fingers): None, normal Lower (legs, knees, ankles, toes): None, normal, Trunk Movements Neck, shoulders, hips: None, normal, Overall Severity Severity of abnormal movements (highest score from questions above): None, normal Incapacitation due to abnormal movements: None, normal Patient's awareness of abnormal movements (rate only patient's report): No Awareness, Dental Status Current problems with teeth and/or dentures?: No Does patient usually wear dentures?: No  CIWA:  CIWA-Ar Total: 3  COWS:  COWS Total Score: 4   Treatment Plan Summary: Daily contact with patient to assess and evaluate symptoms and progress in treatment Medication management  Plan: Supportive approach/coping skills/relapse prevention           Complete the Detox           Increase the Trazodone to 100 mg HS PRN           Pain Management: Midrin Excedrin migraine, Robaxin             Medical Decision Making Problem Points:  Established problem, worsening (2), Review of last therapy session (1) and Review of psycho-social stressors (1) Data Points:  Review of medication regiment & side effects (2) Review of new medications or change in dosage (2)  I certify that inpatient services furnished can reasonably be expected to improve the patient's condition.   Araiya Tilmon,MICKIE D. RPA-C CAQ-Psych  04/28/2012, 11:11 AM

## 2012-04-28 NOTE — Progress Notes (Signed)
Madison Garza  Has had a hard day today. She cont to be somatically -focused....requesting a different medication for any and all physical complaint she might have. Her insight is limited, but when  This Clinical research associate sits with her and processes her problems , she is able to articulate that she is smarter than she gives herself credit for. That she is worthy of good things .    A SHe is medicated per MD order and POC maintained. She requested and was given 3 midrin at 1039, for c/o headache, and she stated relief 1 hr later.    R Con with POC.

## 2012-04-28 NOTE — Progress Notes (Signed)
[facility] Group Notes:  (Counselor/Nursing/MHT/Case Management/Adjunct)  04/27/2012 2100  Type of Therapy:  wrap up group  Participation Level:  Active  Participation Quality:  Appropriate  Affect:  Appropriate  Cognitive:  Appropriate  Insight:  unknown to this Clinical research associate  Engagement in Group:  Engaged  Engagement in Therapy:  Engaged  Modes of Intervention:  Clarification, Armed forces technical officer of Progress/Problems: Pt states that she "wants to dig deep and find out more about herself".  She is happy to have her appetite back and having food available, pt reports being homeless prior to coming in.  Pt plans to go [facility] for long term treatment and unable to return to her mothers where she has been kicked out twice now.    Shelah Lewandowsky 04/28/2012, 2:24 AM

## 2012-04-28 NOTE — Progress Notes (Signed)
Psychoeducational Group Note   Psychoeducational Group Note  Date:  04/28/2012 Time:  1015  Group Topic/Focus:  Making Healthy Choices:   The focus of this group is to help patients identify negative/unhealthy choices they were using prior to admission and identify positive/healthier coping strategies to replace them upon discharge.  Participation Level:  Did Not Attend  Taunya Goral A 04/28/2012   

## 2012-04-29 MED ORDER — HYDROXYZINE HCL 25 MG PO TABS
25.0000 mg | ORAL_TABLET | Freq: Four times a day (QID) | ORAL | Status: DC | PRN
  Administered 2012-04-29 – 2012-05-01 (×6): 25 mg via ORAL

## 2012-04-29 MED ORDER — TRAZODONE HCL 100 MG PO TABS
150.0000 mg | ORAL_TABLET | Freq: Every evening | ORAL | Status: DC | PRN
  Administered 2012-04-29 – 2012-05-01 (×4): 150 mg via ORAL
  Filled 2012-04-29 (×2): qty 3
  Filled 2012-04-29: qty 42
  Filled 2012-04-29 (×3): qty 3
  Filled 2012-04-29: qty 42
  Filled 2012-04-29: qty 3

## 2012-04-29 NOTE — Clinical Social Work Note (Signed)
[facility] LCSW Group Therapy   Type of Therapy:  Discharge planning  Participation Level:  Minimal  Participation Quality: sharing  Affect:  Flat, patient just awoke  Cognitive:  Oriented  Insight:  Adequate  Engagement in Therapy:  Limited  Modes of Intervention:  Exploration, clarification  Summary of Progress/Problems:  Pt denies suicidal and  homicidal ideation.  On a scale of 1 to 10 with ten being the most ever experienced, the patient rates depression at a 9 and anxiety at a 8.5. Patient understands that she will be here for majority of week in order to completely detox.     Clide Dales 04/29/2012 6:34 PM

## 2012-04-29 NOTE — Progress Notes (Signed)
Adult Psychoeducational Group Note  Date:  04/29/2012 Time:  2015  Group Topic/Focus:  The Sober Life--My Behavior  Participation Level:  Minimal  Participation Quality:  Appropriate  Affect:  Flat  Cognitive:  Appropriate  Insight: Improving  Engagement in Group:  Limited  Modes of Intervention:    Additional Comments:   Humberto Seals Monique 04/29/2012, 11:20 PM

## 2012-04-29 NOTE — Clinical Social Work Note (Signed)
[facility] LCSW Group Therapy   Type of Therapy:  Group therapy at 1:15  Participation Level:  appropriate  Participation Quality:  Appropriate  Affect:  Reticent  Cognitive:  Improving  Insight:  Improving  Engagement in Therapy:  Improving  Modes of Intervention:  Expoloation, confrontation  Summary of Progress/Problems:  Pt states one obstacle for her to recovery will be "giving up my friends who use.  I mean I don't want to be a bad friend and all plus I'll be lonely without them."  Patient had difficulty processing the positives of the relationships she holds as important. When asked who has come to see her or called, patient became visiably upset and shared that none of her friends had come.   Clide Dales 04/29/2012 6:34 PM

## 2012-04-29 NOTE — Progress Notes (Signed)
Adult Psychoeducational Group Note  Date:  04/29/2012 Time: 1100  Group Topic/Focus:  Self Care:   The focus of this group is to help patients understand the importance of self-care in order to improve or restore emotional, physical, spiritual, interpersonal, and financial health.  Participation Level:  Active  Participation Quality:  Appropriate  Affect:  Appropriate  Cognitive:  Appropriate  Insight: Appropriate  Engagement in Group:  Engaged  Modes of Intervention:  Education  Additional Comments:  Staff explained to the patient that this group is designed to assist in identifying activities that they will incorporate into their daily living with the intention of improving or restoring emotional, physical, spiritual, interpersonal and financial health. The patients were asked to identify one area or skill where they are taking care of themselves. Patients will identify 2-3 activities of self- care that they will use in their daily living after discharge. Patients were encouraged to utilize the skills and techniques taught and apply it in their daily routine.    Ardelle Park O 04/29/2012, 3:05 PM

## 2012-04-29 NOTE — Progress Notes (Signed)
[facility] MD Progress Note  04/29/2012 3:27 PM Madison Garza  MRN:  [id number] Subjective:  Still somatically focused. Admits to anxiety, also dealing with the headache. She admits she is reacting to other peers in the unit who "get on my nerves." She is worrying about court when she gets out. She is also worrying about her children. Wants to see them.  Diagnosis:  Polysubstance Dependence/Anxiety Disorder NOS/Mood Disorder NOS  ADL's:  Intact  Sleep: Poor  Appetite:  Fair  Suicidal Ideation:  Plan:  Denies Intent:  Denies Means:  Denies Homicidal Ideation:  Plan:  Denies Intent:  Denies Means:  Denies AEB (as evidenced by):  Psychiatric Specialty Exam: Review of Systems  Constitutional: Negative.   Eyes: Negative.   Respiratory: Negative.   Cardiovascular: Negative.   Gastrointestinal: Negative.   Genitourinary: Negative.   Musculoskeletal: Positive for myalgias.  Skin: Negative.   Neurological: Positive for headaches.  Endo/Heme/Allergies: Negative.   Psychiatric/Behavioral: Positive for depression and substance abuse. The patient is nervous/anxious and has insomnia.     Blood pressure 98/67, pulse 90, temperature 97.3 F (36.3 C), temperature source Oral, resp. rate 16, height 5' 3.39" (1.61 m), weight 48.988 kg (108 lb), last menstrual period 04/08/2012.Body mass index is 18.90 kg/(m^2).  General Appearance: Fairly Groomed  Patent attorney::  Fair  Speech:  Clear and Coherent and Slow  Volume:  Decreased  Mood:  Anxious and Depressed  Affect:  Depressed, Tearful and anxious, worried  Thought Process:  Coherent and Goal Directed  Orientation:  Full (Time, Place, and Person)  Thought Content:  WDL, Rumination and worries, concenrs, somatically focused  Suicidal Thoughts:  No  Homicidal Thoughts:  No  Memory:  Immediate;   Fair Recent;   Fair Remote;   Fair  Judgement:  Fair  Insight:  Present  Psychomotor Activity:  Restlessness  Concentration:  Fair  Recall:  Fair    Akathisia:  No  Handed:  Right  AIMS (if indicated):     Assets:  Desire for Improvement  Sleep:  Number of Hours: 6    Current Medications: Current Facility-Administered Medications  Medication Dose Route Frequency Provider Last Rate Last Dose  . acetaminophen (TYLENOL) tablet 650 mg  650 mg Oral Q6H PRN Cleotis Nipper, MD   650 mg at 04/24/12 0806  . alum & mag hydroxide-simeth (MAALOX/MYLANTA) 200-200-20 MG/5ML suspension 30 mL  30 mL Oral Q4H PRN Cleotis Nipper, MD      . feeding supplement (RESOURCE BREEZE) liquid 1 Container  1 Container Oral BID BM Lavena Bullion, RD   1 Container at 04/29/12 1416  . gabapentin (NEURONTIN) capsule 300 mg  300 mg Oral TID Rachael Fee, MD   300 mg at 04/29/12 1107  . hydrOXYzine (ATARAX/VISTARIL) tablet 25 mg  25 mg Oral QHS PRN,MR X 1 Cleotis Nipper, MD   25 mg at 04/28/12 2249  . hydrOXYzine (ATARAX/VISTARIL) tablet 25 mg  25 mg Oral Q6H PRN Rachael Fee, MD   25 mg at 04/29/12 1107  . isometheptene-acetaminophen-dichloralphenazone (MIDRIN) capsule 2 capsule  2 capsule Oral Q4H PRN Mickie D. Adams, PA   2 capsule at 04/29/12 1111  . lidocaine (LIDODERM) 5 % 2 patch  2 patch Transdermal Daily Mickie D. Adams, PA   2 patch at 04/29/12 1000  . magnesium hydroxide (MILK OF MAGNESIA) suspension 30 mL  30 mL Oral Daily PRN Cleotis Nipper, MD      . methocarbamol (ROBAXIN) tablet 500  mg  500 mg Oral Q8H PRN Rachael Fee, MD   500 mg at 04/29/12 1610  . multivitamin with minerals tablet 1 tablet  1 tablet Oral Daily Cleotis Nipper, MD   1 tablet at 04/29/12 0814  . naproxen (NAPROSYN) tablet 500 mg  500 mg Oral BID PRN Rachael Fee, MD   500 mg at 04/26/12 2127  . nicotine (NICODERM CQ - dosed in mg/24 hours) patch 21 mg  21 mg Transdermal Q0600 Cleotis Nipper, MD   21 mg at 04/29/12 0605  . thiamine (B-1) injection 100 mg  100 mg Intramuscular Once Cleotis Nipper, MD      . thiamine (VITAMIN B-1) tablet 100 mg  100 mg Oral Daily Cleotis Nipper, MD   100 mg at  04/29/12 0814  . traZODone (DESYREL) tablet 150 mg  150 mg Oral QHS,MR X 1 Rachael Fee, MD        Lab Results: No results found for this or any previous visit (from the past 48 hour(s)).  Physical Findings: AIMS: Facial and Oral Movements Muscles of Facial Expression: None, normal Lips and Perioral Area: None, normal Jaw: None, normal Tongue: None, normal,Extremity Movements Upper (arms, wrists, hands, fingers): None, normal Lower (legs, knees, ankles, toes): None, normal, Trunk Movements Neck, shoulders, hips: None, normal, Overall Severity Severity of abnormal movements (highest score from questions above): None, normal Incapacitation due to abnormal movements: None, normal Patient's awareness of abnormal movements (rate only patient's report): No Awareness, Dental Status Current problems with teeth and/or dentures?: No Does patient usually wear dentures?: No  CIWA:  CIWA-Ar Total: 0  COWS:  COWS Total Score: 4   Treatment Plan Summary: Daily contact with patient to assess and evaluate symptoms and progress in treatment Medication management  Plan: Supportive approach/coping skills/relapse prevention           CBT/encourage to deal with the anxiety by mind fullness/relaxation/breathing           Rather than using "pills"  Medical Decision Making Problem Points:  Established problem, worsening (2), Review of last therapy session (1) and Review of psycho-social stressors (1) Data Points:  Review of medication regiment & side effects (2) Review of new medications or change in dosage (2)  I certify that inpatient services furnished can reasonably be expected to improve the patient's condition.   Madison Garza A 04/29/2012, 3:27 PM

## 2012-04-29 NOTE — Progress Notes (Signed)
Pt reports that she has had an ongoing headache since Saturday.  She says the pain is minimal at this time, but feels when her anxiety level goes up, so does her headache pain.  She is having minimal withdrawal symptoms, using prns as ordered.  Pt denies SI/HI/AV.  She misses her children and wants to see them.  She is also worried about going to jail because of the DUIs she has against her.  Her affect is flat/sad.  She plans to go to [facility] at discharge from [facility].  Support/encouragement given.  Pt makes her needs known to staff.  Safety maintained with q15 minute checks.

## 2012-04-30 MED ORDER — GABAPENTIN 400 MG PO CAPS
400.0000 mg | ORAL_CAPSULE | Freq: Three times a day (TID) | ORAL | Status: DC
  Administered 2012-04-30 – 2012-05-01 (×5): 400 mg via ORAL
  Filled 2012-04-30 (×3): qty 1
  Filled 2012-04-30 (×2): qty 42
  Filled 2012-04-30 (×2): qty 1
  Filled 2012-04-30: qty 42
  Filled 2012-04-30: qty 1

## 2012-04-30 MED ORDER — DULOXETINE HCL 30 MG PO CPEP
30.0000 mg | ORAL_CAPSULE | Freq: Every day | ORAL | Status: DC
  Administered 2012-04-30 – 2012-05-01 (×2): 30 mg via ORAL
  Filled 2012-04-30: qty 14
  Filled 2012-04-30 (×3): qty 1

## 2012-04-30 MED ORDER — METHOCARBAMOL 500 MG PO TABS
750.0000 mg | ORAL_TABLET | Freq: Three times a day (TID) | ORAL | Status: DC | PRN
  Administered 2012-04-30 – 2012-05-01 (×3): 750 mg via ORAL
  Filled 2012-04-30 (×3): qty 2

## 2012-04-30 MED ORDER — KETOROLAC TROMETHAMINE 30 MG/ML IJ SOLN
30.0000 mg | Freq: Once | INTRAMUSCULAR | Status: AC
  Administered 2012-04-30: 30 mg via INTRAMUSCULAR
  Filled 2012-04-30 (×2): qty 1

## 2012-04-30 NOTE — Progress Notes (Signed)
[facility] Group Notes:  (Counselor/Nursing/MHT/Case Management/Adjunct)  Type of Therapy:  Psychoeducational Skills  Participation Level:  Active  Participation Quality:  Appropriate, Attentive and Sharing  Affect:  Appropriate  Cognitive:  Alert, Appropriate and Oriented  Insight:  Appropriate  Engagement in Group:  Engaged  Modes of Intervention:  Activity, Discussion, Education, Problem-solving, Rapport Building, Socialization and Support  Summary of Progress/Problems: Abrea attended psychoeducational group that focused on using quality time with support systems/individuals to engage in health coping skills. Zianne participated in activity guessing about self and peers. Sulma was quiet but attentive while group discussed who their support systems are, how they can spend positive quality time with them as a coping skills and a way to strengthen their relationship. Shawnique stated her kids are a part of her support system. She was given a homework assignment to find two ways to improve her support systems and twenty activities she can do to spend quality time with her supports.   Wandra Scot 04/30/2012 2:56 PM

## 2012-04-30 NOTE — Clinical Social Work Note (Signed)
Patient has an admit date at [facility] on Thursday morning 1/16 at 8:00 AM; patient grateful for ability to go bed to bed for treatment and contacting boyfriend to arrange transportation. Patient will need to be ready to discharge early Thursday am in order to get to [facility] before 8AM.  Carney Bern, Clinical Social Worker

## 2012-04-30 NOTE — Clinical Social Work Note (Signed)
[facility] LCSW Group Therapy   Type of Therapy:  Discharge Planning  Participation Level:  Active  Participation Quality:  Appropriate  Affect:  Anxious  Cognitive:  Alert and Oriented  Insight:  Improving  Engagement in Therapy:  Improving  Modes of Intervention:  Clarification, Exploration and Support  Summary of Progress/Problems:  Jonia denies both suicidal and homicidal ideation.  On a scale of 1 to 10 with ten being the most ever experienced, the patient rates depression at a 9 and anxiety at a 9.     Clide Dales 04/30/2012 12:32 PM

## 2012-04-30 NOTE — Progress Notes (Signed)
D:  Patient has been up and in the milieu most of the day.  Has complained of headache most all day.  She has had Naproxen, Midrin, hydroxyzine, methocarbamol, and ketorolac.  She denies depression or hopelessness, but says her anxiety level is high.  She denies thoughts of suicide or self harm.  A:  Medications given as ordered.  Encouraged relaxation techniques.  Discussed case with Dr. Dub Mikes who agreed to an increase in the methocarbamol.   R:  Pleasant and cooperative on the unit.  Interacting well with staff and peers.  Has had mixed results with medications today and is looking forward to trying the increased methocarbamol dose.

## 2012-04-30 NOTE — Clinical Social Work Note (Signed)
[facility] LCSW Group Therapy   Type of Therapy:  Group Therapy   Participation Level:  Active  Participation Quality:  Attentive  Affect:  Appropriate  Cognitive:  Appropriate  Engagement in Therapy:  Engaged  Modes of Intervention:  Discussion, Education and Support  Summary of Progress/Problems:  Patient attended group presentation by staff member of  [employer] ([employer]);  was attentive and appropriate during session.  CSW presented educational segment on suicide prevention education after [facility] volunteer left.  Clide Dales 04/30/2012 2:40 PM

## 2012-04-30 NOTE — Progress Notes (Signed)
[facility] MD Progress Note  04/30/2012 4:38 PM Madison MCMANIGAL  MRN:  [id number] Subjective:  Carie endorses that she is still having headaches as well as anxiety. She admits that she is worrying about what will happen once she gets out of here. She is missing her kids, and they are not able to visit due to the Flu precautions. She wants to pursue a residential treatment program.  Diagnosis:   Axis I: Polysubstance Dependence, Anxiety Disorder NOS, Depressive Disorder NOS Axis II: Deferred Axis III:  Past Medical History  Diagnosis Date  . Headache   . Depression    Axis IV: problems related to legal system/crime and problems with primary support group Axis V: 51-60 moderate symptoms  ADL's:  Intact  Sleep: Fair  Appetite:  Fair  Suicidal Ideation:  Plan:  Denies Intent:  Denies Means:  Denies Homicidal Ideation:  Plan:  Denies Intent:  Denies Means:  Denies AEB (as evidenced by):  Psychiatric Specialty Exam: Review of Systems  Constitutional: Negative.   HENT: Positive for neck pain.   Eyes: Negative.   Respiratory: Negative.   Cardiovascular: Negative.   Gastrointestinal: Negative.   Genitourinary: Negative.   Skin: Negative.   Neurological: Positive for headaches.  Endo/Heme/Allergies: Negative.   Psychiatric/Behavioral: Positive for substance abuse. The patient is nervous/anxious.     Blood pressure 102/75, pulse 92, temperature 98 F (36.7 C), temperature source Oral, resp. rate 16, height 5' 3.39" (1.61 m), weight 48.988 kg (108 lb), last menstrual period 04/08/2012.Body mass index is 18.90 kg/(m^2).  General Appearance: Fairly Groomed  Patent attorney::  Fair  Speech:  Clear and Coherent  Volume:  Normal  Mood:  Anxious, Depressed and in pain  Affect:  anxious  Thought Process:  Coherent and Goal Directed  Orientation:  Full (Time, Place, and Person)  Thought Content:  somatically, symptom focused  Suicidal Thoughts:  No  Homicidal Thoughts:  No  Memory:   Immediate;   Fair Recent;   Fair Remote;   Fair  Judgement:  Fair  Insight:  Present  Psychomotor Activity:  Restlessness  Concentration:  Fair  Recall:  Fair  Akathisia:  No  Handed:  Right  AIMS (if indicated):     Assets:  Desire for Improvement  Sleep:  Number of Hours: 6.5    Current Medications: Current Facility-Administered Medications  Medication Dose Route Frequency Provider Last Rate Last Dose  . alum & mag hydroxide-simeth (MAALOX/MYLANTA) 200-200-20 MG/5ML suspension 30 mL  30 mL Oral Q4H PRN Cleotis Nipper, MD      . DULoxetine (CYMBALTA) DR capsule 30 mg  30 mg Oral Daily Rachael Fee, MD   30 mg at 04/30/12 1048  . feeding supplement (RESOURCE BREEZE) liquid 1 Container  1 Container Oral BID BM Lavena Bullion, RD   1 Container at 04/30/12 1451  . gabapentin (NEURONTIN) capsule 400 mg  400 mg Oral TID Rachael Fee, MD   400 mg at 04/30/12 1211  . hydrOXYzine (ATARAX/VISTARIL) tablet 25 mg  25 mg Oral QHS PRN,MR X 1 Cleotis Nipper, MD   25 mg at 04/28/12 2249  . hydrOXYzine (ATARAX/VISTARIL) tablet 25 mg  25 mg Oral Q6H PRN Rachael Fee, MD   25 mg at 04/30/12 1448  . isometheptene-acetaminophen-dichloralphenazone (MIDRIN) capsule 2 capsule  2 capsule Oral Q4H PRN Fredrik Cove, PA-C   2 capsule at 04/30/12 1214  . lidocaine (LIDODERM) 5 % 2 patch  2 patch Transdermal Daily Bethanie Dicker  Adams, PA-C   2 patch at 04/30/12 [phone]  . magnesium hydroxide (MILK OF MAGNESIA) suspension 30 mL  30 mL Oral Daily PRN Cleotis Nipper, MD      . methocarbamol (ROBAXIN) tablet 750 mg  750 mg Oral Q8H PRN Rachael Fee, MD      . multivitamin with minerals tablet 1 tablet  1 tablet Oral Daily Cleotis Nipper, MD   1 tablet at 04/30/12 0803  . naproxen (NAPROSYN) tablet 500 mg  500 mg Oral BID PRN Rachael Fee, MD   500 mg at 04/30/12 0806  . nicotine (NICODERM CQ - dosed in mg/24 hours) patch 21 mg  21 mg Transdermal Q0600 Cleotis Nipper, MD   21 mg at 04/30/12 5409  . thiamine (VITAMIN B-1)  tablet 100 mg  100 mg Oral Daily Cleotis Nipper, MD   100 mg at 04/30/12 0803  . traZODone (DESYREL) tablet 150 mg  150 mg Oral QHS,MR X 1 Rachael Fee, MD   150 mg at 04/29/12 2140    Lab Results: No results found for this or any previous visit (from the past 48 hour(s)).  Physical Findings: AIMS: Facial and Oral Movements Muscles of Facial Expression: None, normal Lips and Perioral Area: None, normal Jaw: None, normal Tongue: None, normal,Extremity Movements Upper (arms, wrists, hands, fingers): None, normal Lower (legs, knees, ankles, toes): None, normal, Trunk Movements Neck, shoulders, hips: None, normal, Overall Severity Severity of abnormal movements (highest score from questions above): None, normal Incapacitation due to abnormal movements: None, normal Patient's awareness of abnormal movements (rate only patient's report): No Awareness, Dental Status Current problems with teeth and/or dentures?: No Does patient usually wear dentures?: No  CIWA:  CIWA-Ar Total: 0  COWS:  COWS Total Score: 4   Treatment Plan Summary: Daily contact with patient to assess and evaluate symptoms and progress in treatment Medication management  Plan: Supportive approach/coping skills/relapse prevention           Increase the Neurontin to 400 mg TID           Start Cymbalta 30 mg in AM   Medical Decision Making Problem Points:  Established problem, worsening (2), Review of last therapy session (1) and Review of psycho-social stressors (1) Data Points:  Review of medication regiment & side effects (2) Review of new medications or change in dosage (2)  I certify that inpatient services furnished can reasonably be expected to improve the patient's condition.   Lucyle Alumbaugh A 04/30/2012, 4:38 PM

## 2012-04-30 NOTE — Progress Notes (Signed)
Pt reports she is doing ok.  She continues to deny SI/HI/AV.  She says she still has a persistent headache for which she is getting Midrin as ordered.  Withdrawal symptoms are minimal.  Pt makes her needs known to staff.  She plans to go to [facility] at discharge.  Possible discharge on Thursday.  Pt is pleasant/cooperative.  Safety maintained with q15 minute checks.

## 2012-04-30 NOTE — Progress Notes (Signed)
Adult Psychoeducational Group Note  Date:  04/30/2012 Time:  2000  Group Topic/Focus:  AA  Participation Level:  Active  Participation Quality:  Appropriate and Attentive  Affect:  Appropriate  Cognitive:  Appropriate  Insight: Appropriate  Engagement in Group:  Engaged  Modes of Intervention:    Additional Comments:    Humberto Seals Monique 04/30/2012, 10:36 PM

## 2012-05-01 MED ORDER — LIDOCAINE 5 % EX PTCH: 2.0000 | MEDICATED_PATCH | Freq: Every day | CUTANEOUS | Status: DC

## 2012-05-01 MED ORDER — TRAZODONE HCL 150 MG PO TABS: 150.0000 mg | ORAL_TABLET | Freq: Every evening | ORAL | Status: DC | PRN

## 2012-05-01 MED ORDER — GABAPENTIN 400 MG PO CAPS: 400.0000 mg | ORAL_CAPSULE | Freq: Three times a day (TID) | ORAL | Status: DC

## 2012-05-01 MED ORDER — DULOXETINE HCL 30 MG PO CPEP: 30.0000 mg | ORAL_CAPSULE | Freq: Every day | ORAL | Status: DC

## 2012-05-01 NOTE — Clinical Social Work Note (Signed)
Interdisciplinary Treatment Plan Update (Adult)  Date: 05/01/2012  Time Reviewed: 9:47 AM   Progress in Treatment: Attending groups: Yes Participating in groups: Yes Taking medication as prescribed:  Yes Tolerating medication:  Yes Family/Significant othe contact made: Yes with mother and boyfriend Patient understands diagnosis: Yes Discussing patient identified problems/goals with staff: Yes Medical problems stabilized or resolved:  Yes Denies suicidal/homicidal ideation: Yes Issues/concerns per patient self-inventory:  none Other: N/A  New problem(s) identified: None Identified  Reason for Continuation of Hospitalization: Medication stabilization Withdrawal symptoms Other; describe patient needs door to door transfer to treatment center.  Leaving on 1/16 at 7 AM  Interventions implemented related to continuation of hospitalization: mood stabilization, medication monitoring and adjustment, group therapy and psycho education, suicide risk assessment, collateral contact, aftercare planning, ongoing physician assessments and safety checks q 15 mins  Additional comments: N/A  Estimated length of stay: 1 day  Discharge Plan:  CSW is assessing for appropriate referrals.   New goal(s): N/A  Review of initial/current patient goals per problem list:    1. Goal(s): Address suicidal ideation  Met: Yes As evidenced by: Patient report 2. Goal (s): Reduce depressive symptoms  Met: No  As evidenced by: Pt rates depression at a 5-9 depending on when she is asked; patient reports this is a long time issue.  Expectations for continued substance abuse treatment at [facility] and followup thereafter are that patient's depression will decrease over time.  Patient encouraged to seek medical assistance if depression is unmanageable.  3. Goal(s): Identify comprehensive mental wellness and sobriety plan  Met: Yes As evidenced [id number] report, with support of CSW Patient discharging to [facility]  residential treatment center on 1/16 at 7am Attendees: Patient:     Family:     Physician:  Geoffery Lyons 05/01/2012  9:56 AM  Nursing:   Roswell Miners, RN 05/01/2012  9:56 AM  Clinical Social Worker Ronda Fairly 05/01/2012  9:56 AM  Other:  Robbie Louis, RN 05/01/2012  9:56 AM  Other:  Truitt Leep, Pshych Intern 05/01/2012  9:56 AM  Other:   05/01/2012    Other:   05/01/2012     Scribe for Treatment Team:   Carney Bern, LCSWA  05/01/2012 9:56 AM

## 2012-05-01 NOTE — Progress Notes (Signed)
Adult Psychoeducational Group Note  Date:  05/01/2012 Time:  12:04 PM  Group Topic/Focus:  Recovery Goals:   The focus of this group is to identify appropriate goals for recovery and establish a plan to achieve them.  Participation Level:  Active  Participation Quality:  Appropriate  Affect:  Appropriate  Cognitive:  Appropriate  Insight: Appropriate  Engagement in Group:  Developing/Improving  Modes of Intervention:  Education  Additional Comments:    Berton Mount T 05/01/2012, 12:04 PM

## 2012-05-01 NOTE — Discharge Summary (Signed)
Physician Discharge Summary Note  Patient:  Madison Garza is an 37 y.o., female MRN:  [id number] DOB:  01-19-1976 Patient phone:  [phone] (home)  Patient address:   [address] [place] [address],   Date of Admission:  04/23/2012  Date of Discharge: 05/02/12  Reason for Admission:  Alcohol abuse  Discharge Diagnoses: Active Problems:  Polysubstance dependence  Mood disorder  Anxiety disorder  Review of Systems  Constitutional: Negative.   HENT: Negative.   Eyes: Negative.   Respiratory: Negative.   Cardiovascular: Negative.   Gastrointestinal: Negative.   Musculoskeletal: Positive for back pain.  Skin: Negative.   Neurological: Negative.   Endo/Heme/Allergies: Negative.   Psychiatric/Behavioral: Positive for depression (Srabilized with medication prior to discharge.) and substance abuse (Hx of ). Negative for suicidal ideas, hallucinations and memory loss. The patient is nervous/anxious (Stabilized with medication prior to discharge.) and has insomnia (Stabilized with medication prior to discharge.).    Axis Diagnosis:   AXIS I:  Polysubstance dependence AXIS II:  Deferred AXIS III:   Past Medical History  Diagnosis Date  . Headache   . Depression    AXIS IV:  other psychosocial or environmental problems AXIS V:  63  Level of Care:  OP  Hospital Course:  Left family a month ago. Got drunk, left as she could not handle it. She got in a lot of trouble. Two DWI in a row. In one of them the baby was with her. Started drinking in [employer]. Drinks couple of times a week, binges (beers up to 24). Ocasionally marijuana, cocaine more recently not as often was using every day cocaine (can talk about her feelings makes her feel so good she can express herself.) Likes to take pain pills, heroin once, Xanax (people give them to her.) Cant function, cant sleep. States she craves alcohol. Can focus on cocaine and Adderall.  After admission assessment and evaluation,  it was determined that patient will need detoxification treatment to stabilize her condition. And her discharge plans included a referral to a long term treatment facility for more intense substance abuse treatment. Ms. Tague was then started on Librium protocol for her alcohol detoxification. He was also enrolled in group counseling sessions and activities to learn coping skills that will help her after discharge to cope better, manage her substance abuse problems to maintain a much longer sobriety. She also was enrolled and attended AA/NA meetings being offered and held on this unit. She has some previous and or identifiable medical conditions that required treatment and or monitoring such as pain episodes. She received medication management for all those health issues as well. Ms. Woolf was monitored closely for any potential problems that may arise as a result of and or during detoxification treatment. Patient tolerated her treatment regimen and detoxification treatment without any significant adverse effects and or reactions.  Patient attended treatment team meeting this am and met with the treatment team memberst. Her symptoms, substance abuse issues, response to treatment and discharge plans discussed. Patient endorsed that she is doing well and stable for discharge to pursue the next phase of her substance abuse treatment.  She was encouraged to join/attend AA/NA meetings being offered and held within her community. She is instructed to get a trusted sponsor from the advise of others or from whomever within the AA meetings seems to make sense, and has a proven track record, and will hold her responsible for her sobriety, and both expects and insists on her total abstinence  from alcohol.   During the discharge team meeting, It was agreed upon between patient and the team that she will be discharged to [facility] for further substance abuse treatment. Upon discharge, patient adamantly denies  suicidal, homicidal ideations, auditory, visual hallucinations, delusional thinking and or withdrawal symptoms. Patient left [facility] with all personal belongings in no apparent distress. She received 2 weeks worth samples of her discharge medications. Transportation per [employer].    Consults:  None  Significant Diagnostic Studies:  labs: CBC with diff, CMP , UDS, Toxicology Tests.  Discharge Vitals:   Blood pressure 92/64, pulse 85, temperature 98.2 F (36.8 C), temperature source Oral, resp. rate 16, height 5' 3.39" (1.61 m), weight 48.988 kg (108 lb), last menstrual period 04/08/2012. Body mass index is 18.90 kg/(m^2). Lab Results:   No results found for this or any previous visit (from the past 72 hour(s)).  Physical Findings: AIMS: Facial and Oral Movements Muscles of Facial Expression: None, normal Lips and Perioral Area: None, normal Jaw: None, normal Tongue: None, normal,Extremity Movements Upper (arms, wrists, hands, fingers): None, normal Lower (legs, knees, ankles, toes): None, normal, Trunk Movements Neck, shoulders, hips: None, normal, Overall Severity Severity of abnormal movements (highest score from questions above): None, normal Incapacitation due to abnormal movements: None, normal Patient's awareness of abnormal movements (rate only patient's report): No Awareness, Dental Status Current problems with teeth and/or dentures?: No Does patient usually wear dentures?: No  CIWA:  CIWA-Ar Total: 0  COWS:  COWS Total Score: 4   Psychiatric Specialty Exam: See Psychiatric Specialty Exam and Suicide Risk Assessment completed by Attending Physician prior to discharge.  Discharge destination:  [facility])  Is patient on multiple antipsychotic therapies at discharge:  No   Has Patient had three or more failed trials of antipsychotic monotherapy by history:  No  Recommended Plan for Multiple Antipsychotic Therapies: NA     Medication List     As of 05/02/2012  3:28 PM      STOP taking these medications         aspirin-acetaminophen-caffeine 250-250-65 MG per tablet   Commonly known as: EXCEDRIN MIGRAINE      TAKE these medications      Indication    DULoxetine 30 MG capsule   Commonly known as: CYMBALTA   Take 1 capsule (30 mg total) by mouth daily. For depression    Indication: Generalized Anxiety Disorder, Major Depressive Disorder      gabapentin 400 MG capsule   Commonly known as: NEURONTIN   Take 1 capsule (400 mg total) by mouth 3 (three) times daily. For anxiety    Indication: Headache      lidocaine 5 %   Commonly known as: LIDODERM   Place 2 patches onto the skin daily. Remove & Discard patch within 12 hours or as directed by MD: For pain       traZODone 150 MG tablet   Commonly known as: DESYREL   Take 1 tablet (150 mg total) by mouth at bedtime and may repeat dose one time if needed. For depression/sleep    Indication: Trouble Sleeping          Follow-up recommendations: Activity:  as tolerated Other:  Keep all scheduled follow-up appointments as recommended.    Comments:  Take all your medications as prescribed by your mental healthcare provider. Report any adverse effects and or reactions from your medicines to your outpatient provider promptly. Patient is instructed and cautioned to not engage in alcohol and or  illegal drug use while on prescription medicines. In the event of worsening symptoms, patient is instructed to call the crisis hotline, 911 and or go to the nearest ED for appropriate evaluation and treatment of symptoms. Follow-up with your primary care provider for your other medical issues, concerns and or health care needs.    Total Discharge Time:  Greater than 30 minutes.  SignedArmandina Stammer I 05/02/2012, 3:28 PM

## 2012-05-01 NOTE — [facility] Suicide Risk Assessment (Signed)
Suicide Risk Assessment  Discharge Assessment     Demographic Factors:  Caucasian  Mental Status Per Nursing Assessment::   On Admission:   (denies SI/HI)  Current Mental Status by Physician: In full contact with reality. There are no suicidal ideas, plans or intent. Her mood is anxious, worried. Her affect is appropriate. She is going to pursue further work on her recovery. She is worried about the legal outcome when she goes to court. But understands that she has to face the consequences of her addiction. Endorses that she has to do it for herself and the kids   Loss Factors: Legal issues and Financial problems/change in socioeconomic status  Historical Factors: NA  Risk Reduction Factors:   Responsible for children under 87 years of age and Sense of responsibility to family  Continued Clinical Symptoms:  Severe Anxiety and/or Agitation Depression:   Comorbid alcohol abuse/dependence Alcohol/Substance Abuse/Dependencies  Cognitive Features That Contribute To Risk: No evidence   Suicide Risk:  Minimal: No identifiable suicidal ideation.  Patients presenting with no risk factors but with morbid ruminations; may be classified as minimal risk based on the severity of the depressive symptoms  Discharge Diagnoses:   AXIS I:  Polysubstance Dependence, Mood disorder NOS, Anxiety Disorder AXIS II:  Deferred AXIS III:   Past Medical History  Diagnosis Date  . Headache   . Depression    AXIS IV:  occupational problems, problems related to legal system/crime and problems with primary support group AXIS V:  61-70 mild symptoms  Plan Of Care/Follow-up recommendations:  Activity:  As tolerated Diet:  Regular To be admitted to [facility] in the morning Is patient on multiple antipsychotic therapies at discharge:  No   Has Patient had three or more failed trials of antipsychotic monotherapy by history:  No  Recommended Plan for Multiple Antipsychotic Therapies: N/A   Madison Garza  A 05/01/2012, 6:00 PM

## 2012-05-01 NOTE — Progress Notes (Signed)
[facility] LCSW Group Therapy   Type of Therapy:  Discharge Planning  Participation Level:  Active  Participation Quality:  Appropriate  Affect:  Anxious and Excited  Cognitive:  Alert and Oriented  Insight:  Improving  Engagement in Therapy:  Improving  Modes of Intervention:  Clarification, Exploration, Rapport Building, Socialization and Support  Summary of Progress/Problems:  Pt denies suicidal ideation and homicidal ideation.  On a scale of 1 to 10 with ten being the most ever experienced, the patient rates depression at a 9 and anxiety at a 9. Patient shares that she is always anxious and showed the group her fingernails that have been bitten down to the quick.  Madison Garza is excited to discharge to inpatient treatment program and reports boyfriend will pick her up. 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     Clide Dales 05/01/2012 9:30 AM

## 2012-05-01 NOTE — Progress Notes (Signed)
Adult Psychoeducational Group Note  Date:  05/01/2012 Time:  10:05 PM  Group Topic/Focus:  AA group  Participation Level:  Active  Participation Quality:  Appropriate  Affect:  Appropriate  Cognitive:  Alert  Insight: Appropriate  Engagement in Group:  Engaged  Modes of Intervention:  Support  Additional Comments:    Flonnie Hailstone 05/01/2012, 10:05 PM

## 2012-05-01 NOTE — Progress Notes (Signed)
Patient ID: Madison Garza, female   DOB: 1976-03-29, 37 y.o.   MRN: [id number] [facility] MD Progress Note  05/01/2012 1:49 PM LUE SYKORA  MRN:  [id number]  Subjective:  "I feel all right, but doing my best. I have been here since Tuesday detoxing from everything. I have a lot of anxiety and migraine headaches. I aching all over and I have some tremors to my hands too. My mood is up and down. My depression and anxiety are at #9 today. I have received some shots for my headaches". Denies SIHI, AVH.  Diagnosis:   Axis I: Polysubstance Dependence, Anxiety Disorder NOS, Depressive Disorder NOS Axis II: Deferred Axis III:  Past Medical History  Diagnosis Date  . Headache   . Depression    Axis IV: problems related to legal system/crime and problems with primary support group Axis V: 51-60 moderate symptoms  ADL's:  Intact  Sleep: Fair  Appetite:  Fair  Suicidal Ideation: "No" Plan:  Denies Intent:  Denies Means:  Denies Homicidal Ideation: "No" Plan:  Denies Intent:  Denies Means:  Denies  AEB (as evidenced by): Per patient's reports.  Psychiatric Specialty Exam: Review of Systems  Constitutional: Negative.   HENT: Positive for neck pain.   Eyes: Negative.   Respiratory: Negative.   Cardiovascular: Negative.   Gastrointestinal: Negative.   Genitourinary: Negative.   Skin: Negative.   Neurological: Positive for headaches.  Endo/Heme/Allergies: Negative.   Psychiatric/Behavioral: Positive for substance abuse. The patient is nervous/anxious.     Blood pressure 92/64, pulse 85, temperature 98.2 F (36.8 C), temperature source Oral, resp. rate 16, height 5' 3.39" (1.61 m), weight 48.988 kg (108 lb), last menstrual period 04/08/2012.Body mass index is 18.90 kg/(m^2).  General Appearance: Fairly Groomed  Patent attorney::  Fair  Speech:  Clear and Coherent  Volume:  Normal  Mood:  Anxious, Depressed and in pain  Affect:  anxious, flat  Thought Process:  Coherent and Goal  Directed  Orientation:  Full (Time, Place, and Person)  Thought Content:  somatically, symptom focused, denies hallucinations  Suicidal Thoughts:  No  Homicidal Thoughts:  No  Memory:  Immediate;   Fair Recent;   Fair Remote;   Fair  Judgement:  Fair  Insight:  Present  Psychomotor Activity:  Restlessness, anxious  Concentration:  Fair  Recall:  Fair  Akathisia:  No  Handed:  Right  AIMS (if indicated):     Assets:  Desire for Improvement  Sleep:  Number of Hours: 6.25    Current Medications: Current Facility-Administered Medications  Medication Dose Route Frequency Provider Last Rate Last Dose  . alum & mag hydroxide-simeth (MAALOX/MYLANTA) 200-200-20 MG/5ML suspension 30 mL  30 mL Oral Q4H PRN Cleotis Nipper, MD      . DULoxetine (CYMBALTA) DR capsule 30 mg  30 mg Oral Daily Rachael Fee, MD   30 mg at 05/01/12 0800  . feeding supplement (RESOURCE BREEZE) liquid 1 Container  1 Container Oral BID BM Lavena Bullion, RD   1 Container at 04/30/12 2001  . gabapentin (NEURONTIN) capsule 400 mg  400 mg Oral TID Rachael Fee, MD   400 mg at 05/01/12 1202  . hydrOXYzine (ATARAX/VISTARIL) tablet 25 mg  25 mg Oral QHS PRN,MR X 1 Cleotis Nipper, MD   25 mg at 04/30/12 2141  . hydrOXYzine (ATARAX/VISTARIL) tablet 25 mg  25 mg Oral Q6H PRN Rachael Fee, MD   25 mg at 05/01/12 1322  . isometheptene-acetaminophen-dichloralphenazone (  MIDRIN) capsule 2 capsule  2 capsule Oral Q4H PRN Fredrik Cove, PA-C   2 capsule at 05/01/12 1610  . lidocaine (LIDODERM) 5 % 2 patch  2 patch Transdermal Daily Fredrik Cove, PA-C   2 patch at 05/01/12 0801  . magnesium hydroxide (MILK OF MAGNESIA) suspension 30 mL  30 mL Oral Daily PRN Cleotis Nipper, MD      . methocarbamol (ROBAXIN) tablet 750 mg  750 mg Oral Q8H PRN Rachael Fee, MD   750 mg at 05/01/12 0759  . multivitamin with minerals tablet 1 tablet  1 tablet Oral Daily Cleotis Nipper, MD   1 tablet at 05/01/12 0800  . naproxen (NAPROSYN) tablet 500 mg   500 mg Oral BID PRN Rachael Fee, MD   500 mg at 04/30/12 0806  . nicotine (NICODERM CQ - dosed in mg/24 hours) patch 21 mg  21 mg Transdermal Q0600 Cleotis Nipper, MD   21 mg at 05/01/12 9604  . thiamine (VITAMIN B-1) tablet 100 mg  100 mg Oral Daily Cleotis Nipper, MD   100 mg at 05/01/12 0800  . traZODone (DESYREL) tablet 150 mg  150 mg Oral QHS,MR X 1 Rachael Fee, MD   150 mg at 04/30/12 2302    Lab Results: No results found for this or any previous visit (from the past 48 hour(s)).  Physical Findings: AIMS: Facial and Oral Movements Muscles of Facial Expression: None, normal Lips and Perioral Area: None, normal Jaw: None, normal Tongue: None, normal,Extremity Movements Upper (arms, wrists, hands, fingers): None, normal Lower (legs, knees, ankles, toes): None, normal, Trunk Movements Neck, shoulders, hips: None, normal, Overall Severity Severity of abnormal movements (highest score from questions above): None, normal Incapacitation due to abnormal movements: None, normal Patient's awareness of abnormal movements (rate only patient's report): No Awareness, Dental Status Current problems with teeth and/or dentures?: No Does patient usually wear dentures?: No  CIWA:  CIWA-Ar Total: 0  COWS:  COWS Total Score: 4   Treatment Plan Summary: Daily contact with patient to assess and evaluate symptoms and progress in treatment Medication management  Plan: Supportive approach/coping skills/relapse prevention. Patient started her Neurontin dose today. Monitor effectiveness of medicines and or adverse effects. No new changes made on the current treatment plan. Encouraged group session participation. Continue current treatment plan.          Medical Decision Making Problem Points:  Established problem, worsening (2), Review of last therapy session (1) and Review of psycho-social stressors (1) Data Points:  Review of medication regiment & side effects (2) Review of new medications or  change in dosage (2)  I certify that inpatient services furnished can reasonably be expected to improve the patient's condition.   Armandina Stammer I 05/01/2012, 1:49 PM

## 2012-05-01 NOTE — Progress Notes (Signed)
Pt reported her sleep as poor even after taking all meds she was ordered at bedtime.  She stated,"I fall asleep but then I wake up and tossed and turned all night from about 0300"Her appetite is improving energy level still low and ability to pay attention as improving.  She rated both her depression and anxiety a 9 and her hopelessness a 7 on her self-inventory.  She denied any S/H ideation or A/V hallucinations.  She is still taking all the prn meds that she can take and is keeping up with the times of each so she will know when to come back and ask for more medication.  See MAR pain and daily care for specific meds given.  She is discharging early tomorrow to [facility] and we have discussed several time (during her asking for prn meds) about how she needed to use some of the tools to help with her anxiety and pain. She was told that she could not have the robaxin or vistaril at [facility] and tried to get her to make different choices not only relying on medication.  She did voice understanding but still took the prn meds anyway.

## 2012-05-01 NOTE — Clinical Social Work Note (Signed)
[facility] LCSW Group Therapy   Type of Therapy:  Group Therapy at 1:15  Participation Level:  Active  Participation Quality:  Appropriate, Attentive and Sharing  Affect:  Appropriate,   Cognitive:  Alert and Oriented  Insight: Improving  Engagement in Therapy:  Engaged  Modes of Intervention:  Clarification, Discussion, Exploration and Support  Summary of Progress/Problems:  Group discussion focused on emotional regulation in early sobriety Patients were able to share range of emotions they have experienced while on the unit.  Discussion then focused on grieve process in recovery including denial, bargaining, anger, depression and acceptance. Colin processed the reality that she feels she is experiencing multiple stages of grief throughout the day, others in group were able to normalize her experience.   Clide Dales

## 2012-05-02 NOTE — Progress Notes (Signed)
Pt reports she is doing ok tonight.  She is being discharged in the morning to go to [facility].  Her boyfriend is her transportation.  She denies SI/HI/AV.  She is somewhat anxious about going to the program, but knows it will be good for her.  She is still asking for prns frequently and has been encouraged to decrease her dependence on pills to help her.  Pt voices understanding.  Pt has been appropriate this evening.  Support/encouragement given.  Safety maintained with q15 minute checks.

## 2012-05-02 NOTE — Progress Notes (Signed)
Patient ID: Madison Garza, female   DOB: [birth year], 37 y.o.   MRN: [id number]  Pt discharged with appt at [facility] this morning at 0800.  Pt being transported by her boyfriend.  Pt's belongings returned and paperwork signed.  Discharge instructions were reviewed with pt and a two week supply of medications were sent.  Pt looking forward to going to the program for further treatment.  Pt denies SI/HI/AV.  Support/encouragement given.

## 2012-05-06 NOTE — Progress Notes (Signed)
Patient Discharge Instructions:  After Visit Summary (AVS): Faxed to: 05/06/12  Discharge Summary Note: Faxed to: 05/06/12  Psychiatric Admission Assessment Note: Faxed to: 05/06/12  Suicide Risk Assessment - Discharge Assessment: Faxed to: 05/06/12  Faxed/Sent to the Next Level Care provider: 05/06/12 Faxed to [facility] @ [phone]  Jerelene Redden, 05/06/2012, 4:15 PM

## 2012-05-09 NOTE — Discharge Summary (Signed)
Agree with assessment and plan Aretha Levi A. Brindy Higginbotham, M.D. 

## 2012-06-19 ENCOUNTER — Emergency Department (HOSPITAL_COMMUNITY): Payer: Self-pay

## 2012-06-19 ENCOUNTER — Encounter (HOSPITAL_COMMUNITY): Payer: Self-pay

## 2012-06-19 ENCOUNTER — Emergency Department (HOSPITAL_COMMUNITY)
Admission: EM | Admit: 2012-06-19 | Discharge: 2012-06-19 | Disposition: A | Discharge status: Home / Self Care | Payer: Self-pay | Attending: Emergency Medicine | Admitting: Emergency Medicine

## 2012-06-19 DIAGNOSIS — M25511 Pain in right shoulder: Secondary | ICD-10-CM

## 2012-06-19 DIAGNOSIS — F1010 Alcohol abuse, uncomplicated: Secondary | ICD-10-CM | POA: Insufficient documentation

## 2012-06-19 DIAGNOSIS — F419 Anxiety disorder, unspecified: Secondary | ICD-10-CM

## 2012-06-19 DIAGNOSIS — S0993XA Unspecified injury of face, initial encounter: Secondary | ICD-10-CM | POA: Insufficient documentation

## 2012-06-19 DIAGNOSIS — Z8669 Personal history of other diseases of the nervous system and sense organs: Secondary | ICD-10-CM | POA: Insufficient documentation

## 2012-06-19 DIAGNOSIS — Z79899 Other long term (current) drug therapy: Secondary | ICD-10-CM | POA: Insufficient documentation

## 2012-06-19 DIAGNOSIS — F411 Generalized anxiety disorder: Secondary | ICD-10-CM | POA: Insufficient documentation

## 2012-06-19 DIAGNOSIS — S46909A Unspecified injury of unspecified muscle, fascia and tendon at shoulder and upper arm level, unspecified arm, initial encounter: Secondary | ICD-10-CM | POA: Insufficient documentation

## 2012-06-19 DIAGNOSIS — S4980XA Other specified injuries of shoulder and upper arm, unspecified arm, initial encounter: Secondary | ICD-10-CM | POA: Insufficient documentation

## 2012-06-19 DIAGNOSIS — F3289 Other specified depressive episodes: Secondary | ICD-10-CM | POA: Insufficient documentation

## 2012-06-19 DIAGNOSIS — F17200 Nicotine dependence, unspecified, uncomplicated: Secondary | ICD-10-CM | POA: Insufficient documentation

## 2012-06-19 DIAGNOSIS — R6884 Jaw pain: Secondary | ICD-10-CM

## 2012-06-19 DIAGNOSIS — F329 Major depressive disorder, single episode, unspecified: Secondary | ICD-10-CM | POA: Insufficient documentation
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
## 2012-06-19 NOTE — ED Notes (Signed)
Patient is alert and oriented x3.  She was given DC instructions and follow up visit instructions.  Patient gave verbal understanding. She was DC ambulatory under her own power to home.  V/S stable.  He was not showing any signs of distress on DC 

## 2012-06-20 NOTE — ED Provider Notes (Signed)
Medical screening examination/treatment/procedure(s) were performed by non-physician practitioner and as supervising physician I was immediately available for consultation/collaboration.  Olivia Mackie, MD 06/20/12 [phone]

## 2012-06-28 ENCOUNTER — Encounter (HOSPITAL_COMMUNITY): Payer: Self-pay | Admitting: *Deleted

## 2012-06-28 ENCOUNTER — Emergency Department (HOSPITAL_COMMUNITY): Payer: Self-pay

## 2012-06-28 ENCOUNTER — Emergency Department (HOSPITAL_COMMUNITY)
Admission: EM | Admit: 2012-06-28 | Discharge: 2012-06-29 | Disposition: A | Discharge status: Home / Self Care | Payer: Self-pay | Attending: Emergency Medicine | Admitting: Emergency Medicine

## 2012-06-28 DIAGNOSIS — F1990 Other psychoactive substance use, unspecified, uncomplicated: Secondary | ICD-10-CM

## 2012-06-28 DIAGNOSIS — F1010 Alcohol abuse, uncomplicated: Secondary | ICD-10-CM | POA: Insufficient documentation

## 2012-06-28 DIAGNOSIS — F17200 Nicotine dependence, unspecified, uncomplicated: Secondary | ICD-10-CM | POA: Insufficient documentation

## 2012-06-28 DIAGNOSIS — L03113 Cellulitis of right upper limb: Secondary | ICD-10-CM | POA: Diagnosis present

## 2012-06-28 DIAGNOSIS — F1410 Cocaine abuse, uncomplicated: Secondary | ICD-10-CM | POA: Insufficient documentation

## 2012-06-28 DIAGNOSIS — Z79899 Other long term (current) drug therapy: Secondary | ICD-10-CM | POA: Insufficient documentation

## 2012-06-28 DIAGNOSIS — Z3202 Encounter for pregnancy test, result negative: Secondary | ICD-10-CM | POA: Insufficient documentation

## 2012-06-28 DIAGNOSIS — F3289 Other specified depressive episodes: Secondary | ICD-10-CM | POA: Insufficient documentation

## 2012-06-28 LAB — CBC
HCT: 39.2 % (ref 36.0–46.0)
RBC: 4.37 MIL/uL (ref 3.87–5.11)
RDW: 15.9 % — ABNORMAL HIGH (ref 11.5–15.5)
WBC: 5.1 10*3/uL (ref 4.0–10.5)

## 2012-06-28 LAB — RAPID URINE DRUG SCREEN, HOSP PERFORMED
Cocaine: POSITIVE — AB
Opiates: POSITIVE — AB
Tetrahydrocannabinol: POSITIVE — AB

## 2012-06-28 LAB — COMPREHENSIVE METABOLIC PANEL
ALT: 43 U/L — ABNORMAL HIGH (ref 0–35)
BUN: 8 mg/dL (ref 6–23)
Chloride: 98 mEq/L (ref 96–112)
GFR calc Af Amer: >90 mL/min (ref >90)
GFR calc non Af Amer: >90 mL/min (ref >90)
Potassium: 3.7 mEq/L (ref 3.5–5.1)
Total Protein: 7.2 g/dL (ref 6.0–8.3)

## 2012-06-28 LAB — ACETAMINOPHEN LEVEL: Acetaminophen (Tylenol), Serum: <15 ug/mL (ref 10–30)

## 2012-06-28 LAB — SALICYLATE LEVEL: Salicylate Lvl: <2 mg/dL — ABNORMAL LOW (ref 2.8–20.0)

## 2012-06-28 MED ORDER — GABAPENTIN 400 MG PO CAPS
400.0000 mg | ORAL_CAPSULE | Freq: Three times a day (TID) | ORAL | Status: DC
  Administered 2012-06-29 (×2): 400 mg via ORAL
  Filled 2012-06-28 (×5): qty 1

## 2012-06-28 MED ORDER — SODIUM CHLORIDE 0.9 % IV BOLUS (SEPSIS)
1000.0000 mL | Freq: Once | INTRAVENOUS | Status: AC
  Administered 2012-06-28: 1000 mL via INTRAVENOUS

## 2012-06-28 MED ORDER — NICOTINE 21 MG/24HR TD PT24
21.0000 mg | MEDICATED_PATCH | Freq: Every day | TRANSDERMAL | Status: DC
  Administered 2012-06-29: 21 mg via TRANSDERMAL
  Filled 2012-06-28: qty 1

## 2012-06-28 MED ORDER — DULOXETINE HCL 30 MG PO CPEP
30.0000 mg | ORAL_CAPSULE | Freq: Every day | ORAL | Status: DC
  Administered 2012-06-29: 30 mg via ORAL
  Filled 2012-06-28: qty 1

## 2012-06-28 MED ORDER — SULFAMETHOXAZOLE-TMP DS 800-160 MG PO TABS
1.0000 | ORAL_TABLET | Freq: Two times a day (BID) | ORAL | Status: DC
  Administered 2012-06-29 (×2): 1 via ORAL
  Filled 2012-06-28 (×2): qty 1

## 2012-06-28 MED ORDER — TRAZODONE HCL 50 MG PO TABS: 150.0000 mg | ORAL_TABLET | Freq: Every evening | ORAL | Status: DC | PRN

## 2012-06-28 MED ORDER — SODIUM CHLORIDE 0.9 % IV BOLUS (SEPSIS): 1000.0000 mL | Freq: Once | INTRAVENOUS | Status: DC

## 2012-06-28 MED ORDER — ALUM & MAG HYDROXIDE-SIMETH 200-200-20 MG/5ML PO SUSP: 30.0000 mL | ORAL | Status: DC | PRN

## 2012-06-28 MED ORDER — IBUPROFEN 600 MG PO TABS: 600.0000 mg | ORAL_TABLET | Freq: Three times a day (TID) | ORAL | Status: DC | PRN

## 2012-06-28 MED ORDER — ACETAMINOPHEN 325 MG PO TABS: 650.0000 mg | ORAL_TABLET | ORAL | Status: DC | PRN

## 2012-06-28 MED ORDER — ONDANSETRON HCL 4 MG PO TABS: 4.0000 mg | ORAL_TABLET | Freq: Three times a day (TID) | ORAL | Status: DC | PRN

## 2012-06-28 NOTE — ED Notes (Signed)
MD at bedside. 

## 2012-06-28 NOTE — ED Notes (Signed)
Pt states that she has been shooting up Heroin and using crack cocaine and drinking alcohol,  Pt's right arm is very sore and area is red and bruising from site of Heroin injection.  Pt states she feels suicidal and she is tearful and very depressed,  Pt has 5 children but she doesn't have them with her presently,  Pt states she got out  Of a 30 day program on February 15 th and started using again.

## 2012-06-28 NOTE — ED Provider Notes (Signed)
History     CSN: [id number]  Arrival date & time 06/28/12  2033   First MD Initiated Contact with Patient 06/28/12 2246      Chief Complaint  Patient presents with  . Medical Clearance    (Consider location/radiation/quality/duration/timing/severity/associated sxs/prior treatment) HPI Comments: Pt is a 37 y/o female with hx of substance abuse by IVDU and smoking crack / cocaine, heavy ETOH use - presents after binging for a week a short time after being released from detox.  She states it was 2 months ago, she now has been binging, not eating or dringking and states she hurts all over, and has redness and increased pain in her RUE at the elbow.  THis was first seen 24 hours ago.  This is made worse with palpation and ROM.  She has been shooting IVD in the RUE at the antecubital fossae.  Sx are severe, nothing makes better or worse, not associated with fever, chills, nausea or vomiting.    The history is provided by the patient.    Past Medical History  Diagnosis Date  . Headache   . Depression     Past Surgical History  Procedure Laterality Date  . Breast enhancement surgery      History reviewed. No pertinent family history.  History  Substance Use Topics  . Smoking status: Current Every Day Smoker -- 1.00 packs/day  . Smokeless tobacco: Never Used  . Alcohol Use: 42.0 oz/week    70 Cans of beer per week    OB History   Grav Para Term Preterm Abortions TAB SAB Ect Mult Living                  Review of Systems  All other systems reviewed and are negative.    Allergies  Review of patient's allergies indicates no known allergies.  Home Medications   Current Outpatient Rx  Name  Route  Sig  Dispense  Refill  . DULoxetine (CYMBALTA) 30 MG capsule   Oral   Take 1 capsule (30 mg total) by mouth daily. For depression   30 capsule   0   . gabapentin (NEURONTIN) 400 MG capsule   Oral   Take 1 capsule (400 mg total) by mouth 3 (three) times daily. For  anxiety   90 capsule   0   . lidocaine (LIDODERM) 5 %   Transdermal   Place 2 patches onto the skin daily. Remove & Discard patch within 12 hours or as directed by MD: For pain   20 patch   0   . traZODone (DESYREL) 150 MG tablet   Oral   Take 1 tablet (150 mg total) by mouth at bedtime and may repeat dose one time if needed. For depression/sleep   60 tablet   0     BP 93/56  Pulse 64  Temp(Src) 98.5 F (36.9 C) (Oral)  Resp 16  SpO2 99%  LMP 06/28/2012  Physical Exam  Nursing note and vitals reviewed. Constitutional: She appears well-developed and well-nourished. No distress.  HENT:  Head: Normocephalic and atraumatic.  Mouth/Throat: Oropharynx is clear and moist. No oropharyngeal exudate.  Eyes: Conjunctivae and EOM are normal. Pupils are equal, round, and reactive to light. Right eye exhibits no discharge. Left eye exhibits no discharge. No scleral icterus.  Neck: Normal range of motion. Neck supple. No JVD present. No thyromegaly present.  Cardiovascular: Normal rate, regular rhythm, normal heart sounds and intact distal pulses.  Exam reveals no gallop and no  friction rub.   No murmur heard. No murmurs  Pulmonary/Chest: Effort normal and breath sounds normal. No respiratory distress. She has no wheezes. She has no rales.  Abdominal: Soft. Bowel sounds are normal. She exhibits no distension and no mass. There is tenderness ( minimal tenderness over the umbilicus - states she had ring in her BB and became red - removed, no purulence).  Musculoskeletal: Normal range of motion. She exhibits tenderness ( pain at [facility] on the R at location of cellulitis). She exhibits no edema.  Lymphadenopathy:    She has no cervical adenopathy.  Neurological: She is alert. Coordination normal.  Skin: Skin is warm and dry. Rash noted. There is erythema ( redness aroung the R AC extending proximally up the upper arm several CM.  no induration, no sub Q emphysema, pain with palpation).   Psychiatric: She has a normal mood and affect. Her behavior is normal.    ED Course  Procedures (including critical care time)  Labs Reviewed  CBC - Abnormal; Notable for the following:    RDW 15.9 (*)    All other components within normal limits  COMPREHENSIVE METABOLIC PANEL - Abnormal; Notable for the following:    Glucose, Bld 136 (*)    AST 54 (*)    ALT 43 (*)    All other components within normal limits  SALICYLATE LEVEL - Abnormal; Notable for the following:    Salicylate Lvl <2.0 (*)    All other components within normal limits  URINE RAPID DRUG SCREEN (HOSP PERFORMED) - Abnormal; Notable for the following:    Opiates POSITIVE (*)    Cocaine POSITIVE (*)    Amphetamines POSITIVE (*)    Tetrahydrocannabinol POSITIVE (*)    All other components within normal limits  ACETAMINOPHEN LEVEL  ETHANOL  POCT PREGNANCY, URINE   Dg Humerus Right  06/28/2012  *RADIOLOGY REPORT*  Clinical Data: Anterior and distal redness and swelling.  Pain. Cellulitis.  IV drug use.  No injury.  RIGHT HUMERUS - 2+ VIEW  Comparison: 06/19/2012 right shoulder.  Findings: Right humerus is normal.  No evidence of acute fracture or subluxation.  No focal bone lesion or bone destruction.  Bone cortex and trabecular architecture appear intact.  No abnormal periosteal reaction.  Soft tissues are unremarkable.  There is no evidence of subcutaneous soft tissue gas or foreign body.  IMPRESSION: Normal right humerus.   Original Report Authenticated By: Burman Nieves, M.D.      1. Substance abuse   2. IVDU (intravenous drug user)   3. Cellulitis of right upper extremity       MDM  Pt has multiple issues, #1, Sub Ab, has multiple + drug screen and needs detox #2, has cellulitis related to IVDU - needs abx, bactrim ordered, no murmur, no other signs of bacteremia.  No fever, no tachycardia.  #3 - needs fluids for some dehydration.  Labs otherwise unremarkable - ACT to evaluate for placement  No air in  soft tissue on imaging, abx ordered.   D/w ACT - will see for placement.  Change of shift - care signed out to Dr. Darnelle Maffucci, MD 06/28/12 2352

## 2012-06-28 NOTE — ED Notes (Signed)
Pt. Belongings up at nurses station near room 4. Pt. Has 1 belongings bag.

## 2012-06-28 NOTE — ED Notes (Signed)
Reddened area on right arm.  Patient reports injecting heroin in that area.  Area is painful also.  MD aware.

## 2012-06-29 MED ORDER — LORAZEPAM 1 MG PO TABS: 1.0000 mg | ORAL_TABLET | Freq: Four times a day (QID) | ORAL | Status: DC | PRN

## 2012-06-29 MED ORDER — LORAZEPAM 1 MG PO TABS
0.0000 mg | ORAL_TABLET | Freq: Four times a day (QID) | ORAL | Status: DC
  Administered 2012-06-29: 2 mg via ORAL
  Filled 2012-06-29: qty 2

## 2012-06-29 MED ORDER — LORAZEPAM 2 MG/ML IJ SOLN: 1.0000 mg | Freq: Four times a day (QID) | INTRAMUSCULAR | Status: DC | PRN

## 2012-06-29 MED ORDER — FOLIC ACID 1 MG PO TABS
1.0000 mg | ORAL_TABLET | Freq: Every day | ORAL | Status: DC
  Administered 2012-06-29: 1 mg via ORAL
  Filled 2012-06-29: qty 1

## 2012-06-29 MED ORDER — LORAZEPAM 1 MG PO TABS: 0.0000 mg | ORAL_TABLET | Freq: Two times a day (BID) | ORAL | Status: DC

## 2012-06-29 MED ORDER — VITAMIN B-1 100 MG PO TABS
100.0000 mg | ORAL_TABLET | Freq: Every day | ORAL | Status: DC
  Administered 2012-06-29: 100 mg via ORAL
  Filled 2012-06-29: qty 1

## 2012-06-29 MED ORDER — ADULT MULTIVITAMIN W/MINERALS CH
1.0000 | ORAL_TABLET | Freq: Every day | ORAL | Status: DC
  Administered 2012-06-29: 1 via ORAL
  Filled 2012-06-29: qty 1

## 2012-06-29 MED ORDER — THIAMINE HCL 100 MG/ML IJ SOLN: 100.0000 mg | Freq: Every day | INTRAMUSCULAR | Status: DC

## 2012-06-29 MED ORDER — SULFAMETHOXAZOLE-TRIMETHOPRIM 800-160 MG PO TABS: ORAL_TABLET | ORAL | Status: DC

## 2012-06-29 NOTE — ED Notes (Signed)
Up to the desk to call for a ride--ride will not be here until around 1700

## 2012-06-29 NOTE — ED Notes (Signed)
Dr wentz into see 

## 2012-06-29 NOTE — ED Provider Notes (Addendum)
Madison Garza is a 37 y.o. female , being evaluated for recurrent substance abuse. She is seeking detoxification. This morning she is calm and cooperative. She is somewhat sleepy. She complains of pain in her right upper arm; but states that it is improving.  Right arm- attention to area, demarcated by a pen drawing, around an area of suspected cellulitis; this area does not appear red today, and there is no induration. Centrally, in the area, there is a tender nodule about 2 x 4 cm, that is most consistent with thrombophlebitis. The area is not fluctuant. I suspect this is related to a superficial vein, and a deep vein. There is no distal edema.  ACT, will evaluate the patient. She will likely require placement.  At this time, she is not yet been seen by a psychiatrist.    Flint Melter, MD 06/29/12 [phone]  The patient requested Prozac, Seroquel, and trazodone. Only, trazodone, is on her medication list she is somewhat sleepy, this morning. I will not prescribe these medicines, at this time; because of no clear evidence that they are indicated.      Flint Melter, MD 06/29/12 1038   She was seen by ACT. She does not want inpatient detoxification. She has friends to stay with.  Reevaluation: 12:35- she is calm and comfortable. She has normal mobility of the right arm.  She has been given resources for followup care. I reinforced the importance of taking antibiotics, and using heat in the right arm, to improve the thrombophlebitis.  Diagnoses #1 polysubstance abuse. #2 thrombophlebitis, right upper arm  Flint Melter, MD 06/29/12 1239

## 2012-06-29 NOTE — ED Notes (Signed)
One bag in locker 39

## 2012-06-29 NOTE — ED Notes (Signed)
ACT into see 

## 2012-06-29 NOTE — ED Notes (Addendum)
Pt dc'd w/ writen dc instructions and prescription x1.  S/S of infection reviewed w/ pt, pt verbalized understanding.  Pt ambulatory,  Belongings returned after leaving the unit.

## 2012-06-29 NOTE — ED Notes (Addendum)
Got report from RN, asked that pt be put on a detox protocol. Asked for another set of VS as well.

## 2012-06-29 NOTE — ED Notes (Signed)
Pt took out her plastic sharp edged earrings they were put in a small plastic bag with her sticker on it and put in her locker.

## 2012-06-29 NOTE — ED Notes (Signed)
Pt Alert x4 report given to Tanya RN tx to Psy ed , v/s 102/49, 61. Rt arm out lined  A/C measurements 1 inch. With slight redness around that area  9 inches width 8 inches in length will monitor.

## 2012-06-29 NOTE — BH Assessment (Signed)
Assessment Note   Madison Garza is an 37 y.o. female.  Patient requesting substance abuse treatment. Sts that she was recently discharged from [facility] 06/06/2012 after receiving 30 days or residential treatment. She has remained sober, however; recently relapsed 1 week ago. She relapsed using heroin, crack cocaine, adderal , THC and alcohol. She has binged on all drugs and alcohol not recalling the specific amount of any substances. She last used each substance including the alcohol. However patient reports using  the adder al for the last time 3/14. She has a history of also using "Molly", benzodiazepines, and opiate pain pills. She reports sweating, hot/cold flashes, and aches as her current withdrawal symptoms. She has also received detox treatment at [facility] in the past. Negative consequences of patients use include obtaining 2 DUI's with a upcoming court date. Patient does not recall the date of her court date.  No SI and HI. No AVH's. Patient reporting a history of depression and anxiety.   Axis I: Depressive Disorder NOS and Polysubstance Dependence Axis II: Deferred Axis III:  Past Medical History  Diagnosis Date  . Headache   . Depression    Axis IV: other psychosocial or environmental problems, problems related to legal system/crime, problems related to social environment, problems with access to health care services and problems with primary support group Axis V: 51-60 moderate symptoms  Past Medical History:  Past Medical History  Diagnosis Date  . Headache   . Depression     Past Surgical History  Procedure Laterality Date  . Breast enhancement surgery      Family History: History reviewed. No pertinent family history.  Social History:  reports that she has been smoking.  She has never used smokeless tobacco. She reports that she drinks about 42.0 ounces of alcohol per week. She reports that she uses illicit drugs (Benzodiazepines, Cocaine, Marijuana, and  Hydrocodone).  Additional Social History:  Alcohol / Drug Use Pain Medications: SEE MAR Prescriptions: SEE MAR Over the Counter: SEE MAR History of alcohol / drug use?: Yes Longest period of sobriety (when/how long): 40 days Negative Consequences of Use: Financial;Personal relationships;Legal Withdrawal Symptoms: Agitation;Sweats;Irritability;Weakness;Fever / Chills Substance #1 Name of Substance 1: Alcohol  1 - Age of First Use: 37 yrs old  1 - Amount (size/oz): Varies; Patient sts, "I have been binging.Marland KitchenMarland KitchenI don't really know exactly how much" 1 - Frequency: daily 1 - Duration: 1 week (relapsed)  1 - Last Use / Amount: 06/27/2012 Substance #2 Name of Substance 2: Crack Cocaine 2 - Age of First Use: 37 yrs old  2 - Amount (size/oz): Varies; Pt sts, "I don't know how much" 2 - Frequency: 1-2x's in the past week 2 - Duration: 1 week (relapsed) 2 - Last Use / Amount: 06/27/2012 Substance #3 Name of Substance 3: Heroin  3 - Age of First Use: 37 yrs old 3 - Amount (size/oz): Patient reports binging on heroin and did not provide a specific amount 3 - Frequency: daily fro the past week 3 - Duration: week 3 - Last Use / Amount: 06/27/2012 Substance #4 Name of Substance 4: Opiate Pain Medications by history 4 - Age of First Use: 37 yrs old 4 - Amount (size/oz): 1-3x's per week 4 - Frequency: several times per week 4 - Duration: yrs  4 - Last Use / Amount: 04/23/2011 Substance #5 Name of Substance 5: Adderral  5 - Age of First Use: 20's 5 - Amount (size/oz): patient sts she doesn't know the amout of  use; reports binging for the past 2 weeks 5 - Frequency: occasional use, per patient  5 - Duration: 1-2x's in the past 2 weeks 5 - Last Use / Amount: 06/28/2012 Substance #6 Name of Substance 6: THC 6 - Age of First Use: 37 yrs old  6 - Amount (size/oz): 1 joint 6 - Frequency: 1-2x's in the past week 6 - Duration: 1 week since relapse; overall use has been on-going for several yrs 6 -  Last Use / Amount: 06/27/2012  CIWA: CIWA-Ar BP: 109/69 mmHg Pulse Rate: 61 Nausea and Vomiting: no nausea and no vomiting Tactile Disturbances: none Tremor: no tremor Auditory Disturbances: not present Paroxysmal Sweats: no sweat visible Visual Disturbances: not present Anxiety: no anxiety, at ease Headache, Fullness in Head: none present Agitation: normal activity (very sleepy) Orientation and Clouding of Sensorium: oriented and can do serial additions CIWA-Ar Total: 0 COWS:    Allergies: No Known Allergies  Home Medications:  (Not in a hospital admission)  OB/GYN Status:  Patient's last menstrual period was 06/28/2012.  General Assessment Data Location of Assessment: [facility] ED Living Arrangements: Non-relatives/Friends Can pt return to current living arrangement?: Yes Admission Status: Voluntary Is patient capable of signing voluntary admission?: Yes Transfer from: Acute Hospital Referral Source: Self/Family/Friend     Risk to self Suicidal Ideation: No Suicidal Intent: No Is patient at risk for suicide?: No Suicidal Plan?: No Access to Means: No What has been your use of drugs/alcohol within the last 12 months?:  (N/A) Previous Attempts/Gestures: No How many times?:  (0) Other Self Harm Risks:  (n/a) Triggers for Past Attempts:  (no previous attempts and/ or gestures) Intentional Self Injurious Behavior: None Family Suicide History: No Recent stressful life event(s): Other (Comment) ("Everything....legal issues") Persecutory voices/beliefs?: No Depression: Yes Depression Symptoms: Feeling angry/irritable;Feeling worthless/self pity;Loss of interest in usual pleasures;Fatigue Substance abuse history and/or treatment for substance abuse?: Yes Suicide prevention information given to non-admitted patients: Not applicable  Risk to Others Homicidal Ideation: No Thoughts of Harm to Others: No Current Homicidal Intent: No Current Homicidal Plan: No Access to  Homicidal Means: No Identified Victim:  (none reported) History of harm to others?: No Assessment of Violence: None Noted Violent Behavior Description:  (patient calm and cooperative) Does patient have access to weapons?: No Criminal Charges Pending?: No Does patient have a court date: No  Psychosis Hallucinations: None noted Delusions: None noted  Mental Status Report Appear/Hygiene: Disheveled Eye Contact: Good Motor Activity: Freedom of movement Speech: Logical/coherent Level of Consciousness: Alert Mood: Depressed Affect: Depressed Anxiety Level: None Thought Processes: Coherent Judgement: Unimpaired Orientation: Person;Place;Time;Situation Obsessive Compulsive Thoughts/Behaviors: None  Cognitive Functioning Concentration: Decreased Memory: Recent Intact;Remote Impaired IQ: Average Insight: Fair Impulse Control: Fair Appetite: Poor Weight Loss:  (pt has not eaten properly in 3-4 days) Weight Gain:  (noen reported) Sleep: Decreased Total Hours of Sleep:  (2-3 hours per night) Vegetative Symptoms: None  ADLScreening [facility] Services) Patient's cognitive ability adequate to safely complete daily activities?: Yes Patient able to express need for assistance with ADLs?: Yes Independently performs ADLs?: Yes (appropriate for developmental age)  Abuse/Neglect [facility]) Physical Abuse: Denies Verbal Abuse: Denies Sexual Abuse: Denies  Prior Inpatient Therapy Prior Inpatient Therapy: Yes Prior Therapy Dates:  (d/c from [facility] 2/13 and [facility]- pt unable to recall date) Prior Therapy Facilty/Provider(s):  ([facility] and [facility]) Reason for Treatment:  (substance abuse treatment)  Prior Outpatient Therapy Prior Outpatient Therapy: Yes Prior Therapy Dates:  (currently) Prior Therapy Facilty/Provider(s):  ([facility]) Reason for  Treatment:  (substance abuse treatment)  ADL Screening (condition at time of admission) Patient's cognitive ability adequate to safely  complete daily activities?: Yes Patient able to express need for assistance with ADLs?: Yes Independently performs ADLs?: Yes (appropriate for developmental age) Weakness of Legs: None Weakness of Arms/Hands: None  Home Assistive Devices/Equipment Home Assistive Devices/Equipment: None  Therapy Consults (therapy consults require a physician order) PT Evaluation Needed: No OT Evalulation Needed: No SLP Evaluation Needed: No Abuse/Neglect Assessment (Assessment to be complete while patient is alone) Physical Abuse: Denies Verbal Abuse: Denies Sexual Abuse: Denies Exploitation of patient/patient's resources: Denies Self-Neglect: Denies Values / Beliefs Cultural Requests During Hospitalization: None Spiritual Requests During Hospitalization: None   Advance Directives (For Healthcare) Advance Directive: Patient does not have advance directive Nutrition Screen- [facility] Adult/[facility] Patient's home diet: Regular  Additional Information 1:1 In Past 12 Months?: No     Disposition:  Disposition Initial Assessment Completed: Yes Disposition of Patient: Outpatient treatment;Referred to (Pt offered inpatient treatment (detox) but refused.) Type of outpatient treatment: Adult;Chemical Dependence - Intensive Outpatient Patient referred to: [facility]  On Site Evaluation by:   Reviewed with Physician:     Melynda Ripple [facility] 06/29/2012 12:42 PM

## 2012-06-29 NOTE — ED Notes (Signed)
[facility] in w/ pt 

## 2012-07-30 ENCOUNTER — Encounter (HOSPITAL_COMMUNITY): Payer: Self-pay | Admitting: *Deleted

## 2012-07-30 ENCOUNTER — Inpatient Hospital Stay (HOSPITAL_COMMUNITY)
Admission: AD | Admit: 2012-07-30 | Discharge: 2012-07-30 | Disposition: A | Discharge status: Home / Self Care | Payer: Self-pay | Source: Ambulatory Visit | Attending: Obstetrics & Gynecology | Admitting: Obstetrics & Gynecology

## 2012-07-30 ENCOUNTER — Inpatient Hospital Stay (HOSPITAL_COMMUNITY): Payer: Self-pay

## 2012-07-30 DIAGNOSIS — N938 Other specified abnormal uterine and vaginal bleeding: Secondary | ICD-10-CM | POA: Insufficient documentation

## 2012-07-30 DIAGNOSIS — R109 Unspecified abdominal pain: Secondary | ICD-10-CM | POA: Insufficient documentation

## 2012-07-30 DIAGNOSIS — N949 Unspecified condition associated with female genital organs and menstrual cycle: Secondary | ICD-10-CM | POA: Insufficient documentation

## 2012-07-30 DIAGNOSIS — N854 Malposition of uterus: Secondary | ICD-10-CM | POA: Insufficient documentation

## 2012-07-30 HISTORY — DX: Bipolar disorder, unspecified: F31.9

## 2012-07-30 HISTORY — DX: Alcohol abuse, uncomplicated: F10.10

## 2012-07-30 HISTORY — DX: Other psychoactive substance abuse, uncomplicated: F19.10

## 2012-07-30 LAB — CBC
Platelets: 171 10*3/uL (ref 150–400)
RDW: 14.4 % (ref 11.5–15.5)
WBC: 4 10*3/uL (ref 4.0–10.5)

## 2012-07-30 LAB — POCT PREGNANCY, URINE: Preg Test, Ur: NEGATIVE

## 2012-07-30 LAB — RAPID URINE DRUG SCREEN, HOSP PERFORMED
Benzodiazepines: NOT DETECTED
Cocaine: POSITIVE — AB
Opiates: NOT DETECTED

## 2012-07-30 LAB — URINALYSIS, ROUTINE W REFLEX MICROSCOPIC
Glucose, UA: NEGATIVE mg/dL
Ketones, ur: NEGATIVE mg/dL
Protein, ur: NEGATIVE mg/dL

## 2012-07-30 LAB — WET PREP, GENITAL: WBC, Wet Prep HPF POC: NONE SEEN

## 2012-07-30 MED ORDER — PROMETHAZINE HCL 25 MG PO TABS
25.0000 mg | ORAL_TABLET | Freq: Four times a day (QID) | ORAL | Status: DC | PRN
  Administered 2012-07-30: 25 mg via ORAL
  Filled 2012-07-30: qty 1

## 2012-07-30 MED ORDER — MEGESTROL ACETATE 40 MG PO TABS: 40.0000 mg | ORAL_TABLET | Freq: Every day | ORAL | Status: DC

## 2012-07-30 MED ORDER — CEFTRIAXONE SODIUM 250 MG IJ SOLR
250.0000 mg | Freq: Once | INTRAMUSCULAR | Status: AC
  Administered 2012-07-30: 250 mg via INTRAMUSCULAR
  Filled 2012-07-30: qty 250

## 2012-07-30 MED ORDER — IBUPROFEN 600 MG PO TABS: 600.0000 mg | ORAL_TABLET | Freq: Four times a day (QID) | ORAL | Status: DC | PRN

## 2012-07-30 MED ORDER — AZITHROMYCIN 1 G PO PACK
1.0000 g | PACK | Freq: Once | ORAL | Status: AC
  Administered 2012-07-30: 1 g via ORAL
  Filled 2012-07-30: qty 1

## 2012-07-30 MED ORDER — KETOROLAC TROMETHAMINE 60 MG/2ML IM SOLN
60.0000 mg | Freq: Once | INTRAMUSCULAR | Status: AC
  Administered 2012-07-30: 60 mg via INTRAMUSCULAR
  Filled 2012-07-30: qty 2

## 2012-07-30 NOTE — MAU Note (Signed)
Patient states she had a period on 4-1 but started bleeding again on 4-10 and has been bleeding heavy every day. States she changes a tampon every hour. Has been having lower abdominal pain for a while, but getting worse and is hard to walk.

## 2012-07-30 NOTE — MAU Provider Note (Signed)
  History     CSN: [id number]  Arrival date and time: 07/30/12 1103   First Provider Initiated Contact with Patient 07/30/12 1201      Chief Complaint  Patient presents with  . Vaginal Bleeding  . Abdominal Pain   HPI  Pt is ?not pregnant and presents with abnormal uterine bleeding.  Pt is not using anything for birth control .  She started on 4/1  And then started on 4/10 heavy with clots changing a tampon every hours.  Pt also has abd pain and has not taken anything today for the pain.  Pt has used Tyelnol and Advil yesterday without any relief.  Pt has lower left quadrant pain that is worse when she walks. Pt denies constipation or diarrhea, UTI symptoms.   Past Medical History  Diagnosis Date  . Headache   . Depression   . Bipolar 1 disorder   . Alcohol abuse   . Drug abuse     Past Surgical History  Procedure Laterality Date  . Breast enhancement surgery      History reviewed. No pertinent family history.  History  Substance Use Topics  . Smoking status: Current Every Day Smoker -- 1.00 packs/day  . Smokeless tobacco: Never Used  . Alcohol Use: 42.0 oz/week    70 Cans of beer per week    Allergies: No Known Allergies  Prescriptions prior to admission  Medication Sig Dispense Refill  . FLUoxetine (PROZAC) 20 MG capsule Take 20 mg by mouth daily.      . QUEtiapine Fumarate (SEROQUEL PO) Take 2 tablets by mouth daily. Patient not sure of strength, gets it from [facility]        ROS Physical Exam   Blood pressure 106/70, pulse 82, temperature 98.5 F (36.9 C), temperature source Oral, resp. rate 16, height 5\' 4"  (1.626 m), weight 60.147 kg (132 lb 9.6 oz), last menstrual period 07/16/2012, SpO2 100.00%.  Physical Exam  MAU Course  Procedures Care handed over to Wynelle Bourgeois, CNM    Assessment and Plan    LINEBERRY,SUSAN 07/30/2012, 12:01 PM

## 2012-07-31 LAB — GC/CHLAMYDIA PROBE AMP
CT Probe RNA: NEGATIVE
GC Probe RNA: NEGATIVE

## 2012-08-01 ENCOUNTER — Encounter: Payer: Self-pay | Admitting: *Deleted

## 2012-08-21 NOTE — MAU Provider Note (Signed)
Attestation of Attending Supervision of Advanced Practitioner (CNM/NP): Evaluation and management procedures were performed by the Advanced Practitioner under my supervision and collaboration.  I have reviewed the Advanced Practitioner's note and chart, I am awaiting the completed note.  HARRAWAY-SMITH, Arihant Pennings 1:32 PM

## 2012-08-29 ENCOUNTER — Ambulatory Visit (INDEPENDENT_AMBULATORY_CARE_PROVIDER_SITE_OTHER): Payer: Self-pay | Admitting: Obstetrics & Gynecology

## 2012-08-29 ENCOUNTER — Encounter: Payer: Self-pay | Admitting: Obstetrics & Gynecology

## 2012-08-29 VITALS — BP 112/75 | HR 68 | Temp 97.1°F | Ht 63.5 in | Wt 134.0 lb

## 2012-08-29 DIAGNOSIS — IMO0002 Reserved for concepts with insufficient information to code with codable children: Secondary | ICD-10-CM | POA: Insufficient documentation

## 2012-08-29 DIAGNOSIS — N920 Excessive and frequent menstruation with regular cycle: Secondary | ICD-10-CM

## 2012-08-29 DIAGNOSIS — Z30013 Encounter for initial prescription of injectable contraceptive: Secondary | ICD-10-CM | POA: Insufficient documentation

## 2012-08-29 DIAGNOSIS — R6889 Other general symptoms and signs: Secondary | ICD-10-CM

## 2012-08-29 DIAGNOSIS — Z3009 Encounter for other general counseling and advice on contraception: Secondary | ICD-10-CM

## 2012-08-29 DIAGNOSIS — N921 Excessive and frequent menstruation with irregular cycle: Secondary | ICD-10-CM | POA: Insufficient documentation

## 2012-08-29 LAB — POCT PREGNANCY, URINE: Preg Test, Ur: NEGATIVE

## 2012-08-29 MED ORDER — MEDROXYPROGESTERONE ACETATE 150 MG/ML IM SUSP
150.0000 mg | Freq: Once | INTRAMUSCULAR | Status: AC
Start: 2012-08-29 — End: 2012-08-29
  Administered 2012-08-29: 150 mg via INTRAMUSCULAR

## 2012-08-29 MED ORDER — MEDROXYPROGESTERONE ACETATE 150 MG/ML IM SUSP: 150.0000 mg | Freq: Once | INTRAMUSCULAR | Status: DC

## 2012-08-29 NOTE — Patient Instructions (Signed)

## 2012-08-29 NOTE — Progress Notes (Signed)
Patient ID: Madison Garza, female   DOB: [birth year], 37 y.o.   MRN: [id number]  Chief Complaint  Patient presents with  . Contraception  . Follow-up    heavy bleeding    HPI Madison Garza is a 37 y.o. female.  Pt bled for about a month starting early April , now spotting taking Megace.She would like to start DMPA, which she has used before.  HPI  Past Medical History  Diagnosis Date  . Headache   . Depression   . Bipolar 1 disorder   . Alcohol abuse   . Drug abuse     Past Surgical History  Procedure Laterality Date  . Breast enhancement surgery      History reviewed. No pertinent family history.  Social History History  Substance Use Topics  . Smoking status: Current Every Day Smoker -- 1.00 packs/day  . Smokeless tobacco: Never Used  . Alcohol Use: 42.0 oz/week    70 Cans of beer per week    No Known Allergies  Current Outpatient Prescriptions  Medication Sig Dispense Refill  . FLUoxetine (PROZAC) 20 MG capsule Take 20 mg by mouth daily.      Marland Kitchen ibuprofen (ADVIL,MOTRIN) 600 MG tablet Take 1 tablet (600 mg total) by mouth every 6 (six) hours as needed for pain.  30 tablet  0  . QUEtiapine Fumarate (SEROQUEL PO) Take 2 tablets by mouth daily. Patient not sure of strength, gets it from [facility]      . megestrol (MEGACE) 40 MG tablet Take 1 tablet (40 mg total) by mouth daily.  30 tablet  0   Current Facility-Administered Medications  Medication Dose Route Frequency Provider Last Rate Last Dose  . medroxyPROGESTERone (DEPO-PROVERA) injection 150 mg  150 mg Intramuscular Once Adam Phenix, MD        Review of Systems Review of Systems  Constitutional: Negative for fatigue.  Genitourinary: Positive for vaginal bleeding (scant now) and menstrual problem.    Blood pressure 112/75, pulse 68, temperature 97.1 F (36.2 C), temperature source Oral, height 5' 3.5" (1.613 m), weight 134 lb (60.782 kg), last menstrual period 07/16/2012.  Physical  Exam Physical Exam  Constitutional: She appears well-developed. No distress.  Abdominal: There is no tenderness.  Genitourinary: Vagina normal and uterus normal. No vaginal discharge found.  Scant bleeding no masses  Skin: Skin is warm and dry.  Psychiatric: She has a normal mood and affect. Her behavior is normal.    Data Reviewed  *RADIOLOGY REPORT*  Clinical Data: Pelvic pain. Cramping. Abnormal uterine bleeding.  TRANSABDOMINAL AND TRANSVAGINAL ULTRASOUND OF PELVIS  Technique: Both transabdominal and transvaginal ultrasound  examinations of the pelvis were performed. Transabdominal  technique was performed for global imaging of the pelvis including  uterus, ovaries, adnexal regions, and pelvic cul-de-sac.  It was necessary to proceed with endovaginal exam following the  transabdominal exam to visualize the retroverted uterus and  ovaries.  Comparison: None.  Findings:  Uterus: 8.2 x 5.0 x 6.3 cm. Retroverted. No fibroids or other  uterine masses are identified.  Endometrium: Double layer thickness measures 13 mm transvaginally.  No focal lesion visualized.  Right ovary: 2.5 x 1.9 x 2.0 cm. Normal appearance.  Left ovary: 3.1 x 1.9 x 1.9 cm. Normal appearance.  Other Findings: Trace amount of free fluid in and left adnexa.  IMPRESSION:  1. No evidence of pelvic mass or other significant abnormality.  2. Endometrial thickness measures 13 mm. If bleeding remains  unresponsive to hormonal  or medical therapy, sonohysterogram should  be considered for focal lesion work-up. (Ref: Radiological  Reasoning: Algorithmic Workup of Abnormal Vaginal Bleeding with  Endovaginal Sonography and Sonohysterography. AJR 2008; 161:W96-04)   Assessment    Episode of menometrorrhagia, resolved on progestin. Wants DMPA for BCM   Normal [place]  Plan    Depo provera 150 mg q 3 month.    Pap    Ashaun Gaughan 08/29/2012, 4:10 PM

## 2012-11-05 ENCOUNTER — Emergency Department (HOSPITAL_COMMUNITY): Payer: Self-pay

## 2012-11-05 ENCOUNTER — Encounter (HOSPITAL_COMMUNITY): Payer: Self-pay | Admitting: Emergency Medicine

## 2012-11-05 ENCOUNTER — Emergency Department (HOSPITAL_COMMUNITY)
Admission: EM | Admit: 2012-11-05 | Discharge: 2012-11-05 | Disposition: A | Discharge status: Home / Self Care | Payer: Self-pay | Attending: Emergency Medicine | Admitting: Emergency Medicine

## 2012-11-05 DIAGNOSIS — Z8659 Personal history of other mental and behavioral disorders: Secondary | ICD-10-CM | POA: Insufficient documentation

## 2012-11-05 DIAGNOSIS — S0003XA Contusion of scalp, initial encounter: Secondary | ICD-10-CM | POA: Insufficient documentation

## 2012-11-05 DIAGNOSIS — I998 Other disorder of circulatory system: Secondary | ICD-10-CM | POA: Insufficient documentation

## 2012-11-05 DIAGNOSIS — R58 Hemorrhage, not elsewhere classified: Secondary | ICD-10-CM

## 2012-11-05 DIAGNOSIS — IMO0002 Reserved for concepts with insufficient information to code with codable children: Secondary | ICD-10-CM

## 2012-11-05 DIAGNOSIS — S1093XA Contusion of unspecified part of neck, initial encounter: Secondary | ICD-10-CM | POA: Insufficient documentation

## 2012-11-05 DIAGNOSIS — F17200 Nicotine dependence, unspecified, uncomplicated: Secondary | ICD-10-CM | POA: Insufficient documentation

## 2012-11-05 MED ORDER — IBUPROFEN 800 MG PO TABS
800.0000 mg | ORAL_TABLET | Freq: Once | ORAL | Status: AC
  Administered 2012-11-05: 800 mg via ORAL
  Filled 2012-11-05: qty 1

## 2012-11-05 NOTE — ED Notes (Signed)
Pt was arrested today for domestic violence against her boyfriend.  Pt was taken to jail.  Jail refused to take her because she has a black eye.  Pt is in [employer] custody.  Just here for check of black eye.
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
## 2012-11-05 NOTE — ED Notes (Signed)
[id number]<NF> Expected date:<BR> Expected time:<BR> Means of arrival:<BR> Comments:<BR> Pt in room

## 2012-11-13 NOTE — ED Provider Notes (Signed)
Medical screening examination/treatment/procedure(s) were performed by non-physician practitioner and as supervising physician I was immediately available for consultation/collaboration.  Toy Baker, MD 11/13/12 [phone]

## 2012-11-26 ENCOUNTER — Telehealth: Payer: Self-pay

## 2012-11-27 ENCOUNTER — Telehealth: Payer: Self-pay | Admitting: General Practice

## 2012-11-27 NOTE — Telephone Encounter (Signed)
Patient called and left message stating she wanted her pap smear results. Called patient and message stated this voicemail box is full and cannot accept new voicemails at this time

## 2012-11-29 ENCOUNTER — Ambulatory Visit: Payer: Self-pay

## 2012-12-03 NOTE — Telephone Encounter (Signed)
Called patient, no answer- unable to leave a VM because her voicemail box is full.

## 2013-08-20 NOTE — Telephone Encounter (Signed)
Opened in error

## 2014-02-05 ENCOUNTER — Ambulatory Visit (INDEPENDENT_AMBULATORY_CARE_PROVIDER_SITE_OTHER): Payer: Self-pay | Admitting: Family

## 2014-02-05 ENCOUNTER — Encounter: Payer: Self-pay | Admitting: Family

## 2014-02-05 VITALS — BP 110/80 | HR 121 | Temp 98.2°F | Resp 20 | Ht 63.5 in | Wt 124.4 lb

## 2014-02-05 DIAGNOSIS — G43909 Migraine, unspecified, not intractable, without status migrainosus: Secondary | ICD-10-CM | POA: Insufficient documentation

## 2014-02-05 DIAGNOSIS — F411 Generalized anxiety disorder: Secondary | ICD-10-CM

## 2014-02-05 DIAGNOSIS — G43809 Other migraine, not intractable, without status migrainosus: Secondary | ICD-10-CM

## 2014-02-05 MED ORDER — KETOROLAC TROMETHAMINE 60 MG/2ML IM SOLN
60.0000 mg | Freq: Once | INTRAMUSCULAR | Status: AC
  Administered 2014-02-05: 60 mg via INTRAMUSCULAR

## 2014-02-05 MED ORDER — BUSPIRONE HCL 7.5 MG PO TABS: 7.5000 mg | ORAL_TABLET | Freq: Two times a day (BID) | ORAL | Status: DC

## 2014-02-05 MED ORDER — PROMETHAZINE HCL 12.5 MG PO TABS: 12.5000 mg | ORAL_TABLET | Freq: Three times a day (TID) | ORAL | Status: DC | PRN

## 2014-02-05 MED ORDER — SUMATRIPTAN SUCCINATE 50 MG PO TABS: 50.0000 mg | ORAL_TABLET | Freq: Once | ORAL | Status: DC

## 2014-02-05 NOTE — Patient Instructions (Signed)
Thank you for choosing [facility].  Summary/Instructions:   Your prescription has been sent to your pharmacy.  Please schedule a follow up in 2 weeks or sooner if necessary for follow up on headaches and anxiety  Please schedule an appointment for a physical at your convenience.  Be cautious with the phenergan because it may make you sleepy. Do not operate or drive heavy machinary.   Migraine Headache A migraine headache is an intense, throbbing pain on one or both sides of your head. A migraine can last for 30 minutes to several hours. CAUSES  The exact cause of a migraine headache is not always known. However, a migraine may be caused when nerves in the brain become irritated and release chemicals that cause inflammation. This causes pain. Certain things may also trigger migraines, such as:  Alcohol.  Smoking.  Stress.  Menstruation.  Aged cheeses.  Foods or drinks that contain nitrates, glutamate, aspartame, or tyramine.  Lack of sleep.  Chocolate.  Caffeine.  Hunger.  Physical exertion.  Fatigue.  Medicines used to treat chest pain (nitroglycerine), birth control pills, estrogen, and some blood pressure medicines. SIGNS AND SYMPTOMS  Pain on one or both sides of your head.  Pulsating or throbbing pain.  Severe pain that prevents daily activities.  Pain that is aggravated by any physical activity.  Nausea, vomiting, or both.  Dizziness.  Pain with exposure to bright lights, loud noises, or activity.  General sensitivity to bright lights, loud noises, or smells. Before you get a migraine, you may get warning signs that a migraine is coming (aura). An aura may include:  Seeing flashing lights.  Seeing bright spots, halos, or zigzag lines.  Having tunnel vision or blurred vision.  Having feelings of numbness or tingling.  Having trouble talking.  Having muscle weakness. DIAGNOSIS  A migraine headache is often diagnosed based  on:  Symptoms.  Physical exam.  A CT scan or MRI of your head. These imaging tests cannot diagnose migraines, but they can help rule out other causes of headaches. TREATMENT Medicines may be given for pain and nausea. Medicines can also be given to help prevent recurrent migraines.  HOME CARE INSTRUCTIONS  Only take over-the-counter or prescription medicines for pain or discomfort as directed by your health care provider. The use of long-term narcotics is not recommended.  Lie down in a dark, quiet room when you have a migraine.  Keep a journal to find out what may trigger your migraine headaches. For example, write down:  What you eat and drink.  How much sleep you get.  Any change to your diet or medicines.  Limit alcohol consumption.  Quit smoking if you smoke.  Get 7-9 hours of sleep, or as recommended by your health care provider.  Limit stress.  Keep lights dim if bright lights bother you and make your migraines worse. SEEK IMMEDIATE MEDICAL CARE IF:   Your migraine becomes severe.  You have a fever.  You have a stiff neck.  You have vision loss.  You have muscular weakness or loss of muscle control.  You start losing your balance or have trouble walking.  You feel faint or pass out.  You have severe symptoms that are different from your first symptoms. MAKE SURE YOU:   Understand these instructions.  Will watch your condition.  Will get help right away if you are not doing well or get worse. Document Released: 04/03/2005 Document Revised: 08/18/2013 Document Reviewed: 12/09/2012 [facility] Patient Information 2015 [employer]. This information is not intended to replace advice given to you by your health care provider. Make sure you discuss any questions you have with your health care provider.  

## 2014-02-05 NOTE — Progress Notes (Signed)
   Subjective:    Patient ID: Madison Garza, female    DOB: 1975-06-12, 38 y.o.   MRN: [id number]  Chief Complaint  Patient presents with  . Headache    on and off for years but have gotten worse recently, nausea and vomitting have been apart of headaches    HPI:  Madison ShorterDanae A Deyarmin is a 38 y.o. female who presents today for headaches.   1) Headaches seem to be getting worse. Has been increasing in intensity the past couple day. Throbbing pain, with associated nausea, vomiting and sensiitivity to light and sound. Has not really noticed any triggers - possibly stress and  notes before and after menstrual cycle. Denies currently taking birth control. Takes a lot of excedrine that does not work.   2) Anxiety - has experienced on/off anxiety which increases in intensity from time to time which makes her very tense. Believes that this could be potentate headaches.  No Known Allergies  No current outpatient prescriptions on file prior to visit.   No current facility-administered medications on file prior to visit.   Past Medical History  Diagnosis Date  . Headache(784.0)   . Depression   . Bipolar 1 disorder   . Alcohol abuse   . Drug abuse     Review of Systems    See HPI Objective:    BP 110/80  Pulse 121  Temp(Src) 98.2 F (36.8 C) (Oral)  Resp 20  Ht 5' 3.5" (1.613 m)  Wt 124 lb 6.4 oz (56.427 kg)  BMI 21.69 kg/m2  SpO2 99% Nursing note and vital signs reviewed.  Physical Exam  Constitutional: She is oriented to person, place, and time. She appears well-developed and well-nourished.  Laying on exam table with hood over her head, appears stated age, in NAD  Cardiovascular: Normal rate, regular rhythm and normal heart sounds.   Pulmonary/Chest: Effort normal and breath sounds normal.  Neurological: She is alert and oriented to person, place, and time. No cranial nerve deficit. Coordination normal.  Skin: Skin is warm and dry.  Psychiatric: Her behavior is normal.  Judgment and thought content normal. Her mood appears anxious. Cognition and memory are normal.       Assessment & Plan:

## 2014-02-05 NOTE — Assessment & Plan Note (Signed)
Symptoms consistent with migraine headaches possibly associated with menstrual cycle or anxiety. Toradol injection given in office. Start phenergan when arriving home - discussed sedation effects with the patient. Start sumatriptan for headaches. Follow up in 2 weeks

## 2014-02-05 NOTE — Progress Notes (Signed)
Pre visit review using our clinic review tool, if applicable. No additional management support is needed unless otherwise documented below in the visit note. 

## 2014-02-05 NOTE — Assessment & Plan Note (Signed)
Describes states of anxiety. Start buspar for 2 weeks and follow up to see if improvements are noted.

## 2014-02-06 ENCOUNTER — Telehealth: Payer: Self-pay | Admitting: Family

## 2014-02-06 NOTE — Telephone Encounter (Signed)
emmi mailed  °

## 2014-02-16 ENCOUNTER — Encounter: Payer: Self-pay | Admitting: Family

## 2014-02-19 ENCOUNTER — Other Ambulatory Visit: Payer: Self-pay | Admitting: Family

## 2014-02-19 ENCOUNTER — Encounter (INDEPENDENT_AMBULATORY_CARE_PROVIDER_SITE_OTHER): Payer: BC Managed Care – PPO | Admitting: Family

## 2014-02-19 ENCOUNTER — Telehealth: Payer: Self-pay | Admitting: Family

## 2014-02-19 ENCOUNTER — Encounter: Payer: Self-pay | Admitting: Family

## 2014-02-19 MED ORDER — SUMATRIPTAN SUCCINATE 50 MG PO TABS: 50.0000 mg | ORAL_TABLET | Freq: Once | ORAL | Status: DC

## 2014-02-19 NOTE — Progress Notes (Signed)
Pre visit review using our clinic review tool, if applicable. No additional management support is needed unless otherwise documented below in the visit note. 

## 2014-02-19 NOTE — Telephone Encounter (Signed)
Called pt. She has no VM.

## 2014-02-19 NOTE — Telephone Encounter (Signed)
Please call patient to apologize that we were unable to see her today and I have refilled her prescription for her Imitrex.

## 2014-02-25 ENCOUNTER — Ambulatory Visit (INDEPENDENT_AMBULATORY_CARE_PROVIDER_SITE_OTHER): Payer: BC Managed Care – PPO | Admitting: Internal Medicine

## 2014-02-25 ENCOUNTER — Encounter: Payer: Self-pay | Admitting: Internal Medicine

## 2014-02-25 VITALS — BP 90/70 | HR 78 | Temp 97.6°F | Resp 12 | Wt 130.0 lb

## 2014-02-25 DIAGNOSIS — R112 Nausea with vomiting, unspecified: Secondary | ICD-10-CM

## 2014-02-25 DIAGNOSIS — R519 Headache, unspecified: Secondary | ICD-10-CM

## 2014-02-25 DIAGNOSIS — R51 Headache: Secondary | ICD-10-CM

## 2014-02-25 DIAGNOSIS — F411 Generalized anxiety disorder: Secondary | ICD-10-CM

## 2014-02-25 MED ORDER — ONDANSETRON HCL 4 MG PO TABS: 4.0000 mg | ORAL_TABLET | Freq: Three times a day (TID) | ORAL | Status: DC | PRN

## 2014-02-25 MED ORDER — SUMATRIPTAN SUCCINATE 100 MG PO TABS: ORAL_TABLET | ORAL | Status: DC

## 2014-02-25 MED ORDER — TOPIRAMATE 25 MG PO TABS: ORAL_TABLET | ORAL | Status: DC

## 2014-02-25 NOTE — Patient Instructions (Addendum)
Chronic daily headaches represent  a conversion of migraines into a headache which recurs repeatedly every day .These are almost always  due to the use of excess nonsteroidals such as ibuprofen or naproxen and or caffeine ( in Excedrin . The nonsteroidals and caffeine should be weaned as quickly as possible; but they should not be "cold [place]key" going from a high dose to none.  Please keep a diary of your headaches . Document  each occurrence on the calendar with notation of : #1 any prodrome ( any non headache symptom such as marked fatigue,visual changes, ,etc ) which precedes actual headache ; #2) severity on 1-10 scale; #3) any triggers ( food/ drink,enviromenntal or weather changes ,physical or emotional stress) in 8-12 hour period prior to the headache; & #4) response to any medications or other intervention. Please review "Headache" @ [facility] for additional information.    Imitrex can not be taken if there is possibility of pregnancy.Protection must be employed if having sexual relations as we discussed.

## 2014-02-25 NOTE — Progress Notes (Signed)
Pre visit review using our clinic review tool, if applicable. No additional management support is needed unless otherwise documented below in the visit note. 
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
## 2014-02-26 ENCOUNTER — Ambulatory Visit: Payer: BC Managed Care – PPO | Admitting: Family

## 2014-03-02 ENCOUNTER — Telehealth: Payer: Self-pay

## 2014-03-02 NOTE — Telephone Encounter (Signed)
[facility] [phone] stating Sumatriptan requires prior authorization. Request form for this has been filled out on cover my meds so now awaiting insurance response.

## 2014-03-09 NOTE — Telephone Encounter (Signed)
I called patient's plan since no information has been relayed about if prior authorization has been approved or not. I was on hold for 15 minutes. I will call [phone] at a later time.

## 2014-03-09 NOTE — Telephone Encounter (Signed)
Attempted to contact patient's plan. I was on hold for 8 minutes. I needed to go get patient's due to being in clinic. Will try again later.

## 2014-03-09 NOTE — Telephone Encounter (Signed)
[facility] wrong. 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 PA has been resubmitted through [employer]

## 2014-03-17 ENCOUNTER — Encounter: Payer: Self-pay | Admitting: Family

## 2014-03-19 ENCOUNTER — Ambulatory Visit (INDEPENDENT_AMBULATORY_CARE_PROVIDER_SITE_OTHER)
Admission: RE | Admit: 2014-03-19 | Discharge: 2014-03-19 | Disposition: A | Discharge status: Home / Self Care | Payer: BC Managed Care – PPO | Source: Ambulatory Visit | Attending: Family | Admitting: Family

## 2014-03-19 ENCOUNTER — Other Ambulatory Visit (INDEPENDENT_AMBULATORY_CARE_PROVIDER_SITE_OTHER): Payer: BC Managed Care – PPO

## 2014-03-19 ENCOUNTER — Encounter: Payer: Self-pay | Admitting: Family

## 2014-03-19 ENCOUNTER — Ambulatory Visit (INDEPENDENT_AMBULATORY_CARE_PROVIDER_SITE_OTHER): Payer: BC Managed Care – PPO

## 2014-03-19 ENCOUNTER — Ambulatory Visit (INDEPENDENT_AMBULATORY_CARE_PROVIDER_SITE_OTHER): Payer: BC Managed Care – PPO | Admitting: Family

## 2014-03-19 ENCOUNTER — Telehealth: Payer: Self-pay | Admitting: Family

## 2014-03-19 VITALS — BP 108/68 | HR 69 | Temp 98.6°F | Resp 18 | Ht 63.5 in | Wt 128.8 lb

## 2014-03-19 DIAGNOSIS — M25551 Pain in right hip: Secondary | ICD-10-CM

## 2014-03-19 DIAGNOSIS — M25511 Pain in right shoulder: Secondary | ICD-10-CM

## 2014-03-19 DIAGNOSIS — Z0000 Encounter for general adult medical examination without abnormal findings: Secondary | ICD-10-CM

## 2014-03-19 DIAGNOSIS — G43809 Other migraine, not intractable, without status migrainosus: Secondary | ICD-10-CM

## 2014-03-19 DIAGNOSIS — Z23 Encounter for immunization: Secondary | ICD-10-CM

## 2014-03-19 LAB — LIPID PANEL
Cholesterol: 155 mg/dL (ref 0–200)
HDL: 39 mg/dL — AB (ref >39.00)
LDL CALC: 106 mg/dL — AB (ref 0–99)
NonHDL: 116
TRIGLYCERIDES: 51 mg/dL (ref 0.0–149.0)
Total CHOL/HDL Ratio: 4
VLDL: 10.2 mg/dL (ref 0.0–40.0)

## 2014-03-19 LAB — BASIC METABOLIC PANEL
BUN: 9 mg/dL (ref 6–23)
CALCIUM: 8.6 mg/dL (ref 8.4–10.5)
CO2: 25 mEq/L (ref 19–32)
Chloride: 105 mEq/L (ref 96–112)
Creatinine, Ser: 0.6 mg/dL (ref 0.4–1.2)
GFR: 120.51 mL/min (ref >60.00)
Glucose, Bld: 92 mg/dL (ref 70–99)
Potassium: 4.2 mEq/L (ref 3.5–5.1)
SODIUM: 137 meq/L (ref 135–145)

## 2014-03-19 LAB — TSH: TSH: 1.48 u[IU]/mL (ref 0.35–4.50)

## 2014-03-19 LAB — CBC
HEMATOCRIT: 42 % (ref 36.0–46.0)
HEMOGLOBIN: 13.8 g/dL (ref 12.0–15.0)
MCHC: 32.9 g/dL (ref 30.0–36.0)
MCV: 93.2 fl (ref 78.0–100.0)
Platelets: 233 10*3/uL (ref 150.0–400.0)
RBC: 4.51 Mil/uL (ref 3.87–5.11)
RDW: 13.6 % (ref 11.5–15.5)
WBC: 6.4 10*3/uL (ref 4.0–10.5)

## 2014-03-19 MED ORDER — SUMATRIPTAN SUCCINATE 100 MG PO TABS: ORAL_TABLET | ORAL | Status: DC

## 2014-03-19 MED ORDER — TOPIRAMATE 50 MG PO TABS: 50.0000 mg | ORAL_TABLET | Freq: Two times a day (BID) | ORAL | Status: DC

## 2014-03-19 MED ORDER — PROMETHAZINE HCL 25 MG PO TABS: 25.0000 mg | ORAL_TABLET | Freq: Three times a day (TID) | ORAL | Status: DC | PRN

## 2014-03-19 MED ORDER — ALPRAZOLAM 0.25 MG PO TABS: 0.2500 mg | ORAL_TABLET | Freq: Two times a day (BID) | ORAL | Status: DC | PRN

## 2014-03-19 NOTE — Patient Instructions (Signed)
Thank you for choosing [facility].  Summary/Instructions:  Your prescription(s) have been submitted to your pharmacy. Please take as directed and contact our office if you believe you are having problem(s) with the medication(s).  Please stop by the lab on the basement level of the building for your blood work. Your results will be released to [place] (or called to you) after review, usually within 72hours after test completion. If any changes need to be made, you will be notified at that same time.  Please stop by radiology on the basement level of the building for your x-rays. Your results will be released to [place] (or called to you) after review, usually within 72hours after test completion. If any changes need to be made, you will be notified at that same time.  Health Maintenance Adopting a healthy lifestyle and getting preventive care can go a long way to promote health and wellness. Talk with your health care provider about what schedule of regular examinations is right for you. This is a good chance for you to check in with your provider about disease prevention and staying healthy. In between checkups, there are plenty of things you can do on your own. Experts have done a lot of research about which lifestyle changes and preventive measures are most likely to keep you healthy. Ask your health care provider for more information. WEIGHT AND DIET  Eat a healthy diet  Be sure to include plenty of vegetables, fruits, low-fat dairy products, and lean protein.  Do not eat a lot of foods high in solid fats, added sugars, or salt.  Get regular exercise. This is one of the most important things you can do for your health.  Most adults should exercise for at least 150 minutes each week. The exercise should increase your heart rate and make you sweat (moderate-intensity exercise).  Most adults should also do strengthening exercises at least twice a week. This is in addition to the  moderate-intensity exercise.  Maintain a healthy weight  Body mass index (BMI) is a measurement that can be used to identify possible weight problems. It estimates body fat based on height and weight. Your health care provider can help determine your BMI and help you achieve or maintain a healthy weight.  For females 80 years of age and older:   A BMI below 18.5 is considered underweight.  A BMI of 18.5 to 24.9 is normal.  A BMI of 25 to 29.9 is considered overweight.  A BMI of 30 and above is considered obese.  Watch levels of cholesterol and blood lipids  You should start having your blood tested for lipids and cholesterol at 39 years of age, then have this test every 5 years.  You may need to have your cholesterol levels checked more often if:  Your lipid or cholesterol levels are high.  You are older than 39 years of age.  You are at high risk for heart disease.  CANCER SCREENING   Lung Cancer  Lung cancer screening is recommended for adults 35-70 years old who are at high risk for lung cancer because of a history of smoking.  A yearly low-dose CT scan of the lungs is recommended for people who:  Currently smoke.  Have quit within the past 15 years.  Have at least a 30-pack-year history of smoking. A pack year is smoking an average of one pack of cigarettes a day for 1 year.  Yearly screening should continue until it has been 15 years since  you quit.  Yearly screening should stop if you develop a health problem that would prevent you from having lung cancer treatment.  Breast Cancer  Practice breast self-awareness. This means understanding how your breasts normally appear and feel.  It also means doing regular breast self-exams. Let your health care provider know about any changes, no matter how small.  If you are in your 20s or 30s, you should have a clinical breast exam (CBE) by a health care provider every 1-3 years as part of a regular health exam.  If  you are 22 or older, have a CBE every year. Also consider having a breast X-ray (mammogram) every year.  If you have a family history of breast cancer, talk to your health care provider about genetic screening.  If you are at high risk for breast cancer, talk to your health care provider about having an MRI and a mammogram every year.  Breast cancer gene (BRCA) assessment is recommended for women who have family members with BRCA-related cancers. BRCA-related cancers include:  Breast.  Ovarian.  Tubal.  Peritoneal cancers.  Results of the assessment will determine the need for genetic counseling and BRCA1 and BRCA2 testing. Cervical Cancer Routine pelvic examinations to screen for cervical cancer are no longer recommended for nonpregnant women who are considered low risk for cancer of the pelvic organs (ovaries, uterus, and vagina) and who do not have symptoms. A pelvic examination may be necessary if you have symptoms including those associated with pelvic infections. Ask your health care provider if a screening pelvic exam is right for you.   The Pap test is the screening test for cervical cancer for women who are considered at risk.  If you had a hysterectomy for a problem that was not cancer or a condition that could lead to cancer, then you no longer need Pap tests.  If you are older than 65 years, and you have had normal Pap tests for the past 10 years, you no longer need to have Pap tests.  If you have had past treatment for cervical cancer or a condition that could lead to cancer, you need Pap tests and screening for cancer for at least 20 years after your treatment.  If you no longer get a Pap test, assess your risk factors if they change (such as having a new sexual partner). This can affect whether you should start being screened again.  Some women have medical problems that increase their chance of getting cervical cancer. If this is the case for you, your health care  provider may recommend more frequent screening and Pap tests.  The human papillomavirus (HPV) test is another test that may be used for cervical cancer screening. The HPV test looks for the virus that can cause cell changes in the cervix. The cells collected during the Pap test can be tested for HPV.  The HPV test can be used to screen women 16 years of age and older. Getting tested for HPV can extend the interval between normal Pap tests from three to five years.  An HPV test also should be used to screen women of any age who have unclear Pap test results.  After 39 years of age, women should have HPV testing as often as Pap tests.  Colorectal Cancer  This type of cancer can be detected and often prevented.  Routine colorectal cancer screening usually begins at 39 years of age and continues through 39 years of age.  Your health care provider may  recommend screening at an earlier age if you have risk factors for colon cancer.  Your health care provider may also recommend using home test kits to check for hidden blood in the stool.  A small camera at the end of a tube can be used to examine your colon directly (sigmoidoscopy or colonoscopy). This is done to check for the earliest forms of colorectal cancer.  Routine screening usually begins at age 43.  Direct examination of the colon should be repeated every 5-10 years through 39 years of age. However, you may need to be screened more often if early forms of precancerous polyps or small growths are found. Skin Cancer  Check your skin from head to toe regularly.  Tell your health care provider about any new moles or changes in moles, especially if there is a change in a mole's shape or color.  Also tell your health care provider if you have a mole that is larger than the size of a pencil eraser.  Always use sunscreen. Apply sunscreen liberally and repeatedly throughout the day.  Protect yourself by wearing long sleeves, pants, a  wide-brimmed hat, and sunglasses whenever you are outside. HEART DISEASE, DIABETES, AND HIGH BLOOD PRESSURE   Have your blood pressure checked at least every 1-2 years. High blood pressure causes heart disease and increases the risk of stroke.  If you are between 81 years and 42 years old, ask your health care provider if you should take aspirin to prevent strokes.  Have regular diabetes screenings. This involves taking a blood sample to check your fasting blood sugar level.  If you are at a normal weight and have a low risk for diabetes, have this test once every three years after 39 years of age.  If you are overweight and have a high risk for diabetes, consider being tested at a younger age or more often. PREVENTING INFECTION  Hepatitis B  If you have a higher risk for hepatitis B, you should be screened for this virus. You are considered at high risk for hepatitis B if:  You were born in a country where hepatitis B is common. Ask your health care provider which countries are considered high risk.  Your parents were born in a high-risk country, and you have not been immunized against hepatitis B (hepatitis B vaccine).  You have HIV or AIDS.  You use needles to inject street drugs.  You live with someone who has hepatitis B.  You have had sex with someone who has hepatitis B.  You get hemodialysis treatment.  You take certain medicines for conditions, including cancer, organ transplantation, and autoimmune conditions. Hepatitis C  Blood testing is recommended for:  Everyone born from [birth year] through 1966.  Anyone with known risk factors for hepatitis C. Sexually transmitted infections (STIs)  You should be screened for sexually transmitted infections (STIs) including gonorrhea and chlamydia if:  You are sexually active and are younger than 39 years of age.  You are older than 39 years of age and your health care provider tells you that you are at risk for this type of  infection.  Your sexual activity has changed since you were last screened and you are at an increased risk for chlamydia or gonorrhea. Ask your health care provider if you are at risk.  If you do not have HIV, but are at risk, it may be recommended that you take a prescription medicine daily to prevent HIV infection. This is called pre-exposure prophylaxis (PrEP). You  are considered at risk if:  You are sexually active and do not regularly use condoms or know the HIV status of your partner(s).  You take drugs by injection.  You are sexually active with a partner who has HIV. Talk with your health care provider about whether you are at high risk of being infected with HIV. If you choose to begin PrEP, you should first be tested for HIV. You should then be tested every 3 months for as long as you are taking PrEP.  PREGNANCY   If you are premenopausal and you may become pregnant, ask your health care provider about preconception counseling.  If you may become pregnant, take 400 to 800 micrograms (mcg) of folic acid every day.  If you want to prevent pregnancy, talk to your health care provider about birth control (contraception). OSTEOPOROSIS AND MENOPAUSE   Osteoporosis is a disease in which the bones lose minerals and strength with aging. This can result in serious bone fractures. Your risk for osteoporosis can be identified using a bone density scan.  If you are 25 years of age or older, or if you are at risk for osteoporosis and fractures, ask your health care provider if you should be screened.  Ask your health care provider whether you should take a calcium or vitamin D supplement to lower your risk for osteoporosis.  Menopause may have certain physical symptoms and risks.  Hormone replacement therapy may reduce some of these symptoms and risks. Talk to your health care provider about whether hormone replacement therapy is right for you.  HOME CARE INSTRUCTIONS   Schedule regular  health, dental, and eye exams.  Stay current with your immunizations.   Do not use any tobacco products including cigarettes, chewing tobacco, or electronic cigarettes.  If you are pregnant, do not drink alcohol.  If you are breastfeeding, limit how much and how often you drink alcohol.  Limit alcohol intake to no more than 1 drink per day for nonpregnant women. One drink equals 12 ounces of beer, 5 ounces of wine, or 1 ounces of hard liquor.  Do not use street drugs.  Do not share needles.  Ask your health care provider for help if you need support or information about quitting drugs.  Tell your health care provider if you often feel depressed.  Tell your health care provider if you have ever been abused or do not feel safe at home. Document Released: 10/17/2010 Document Revised: 08/18/2013 Document Reviewed: 03/05/2013 [facility] Patient Information 2015 [place], [place]. This information is not intended to replace advice given to you by your health care provider. Make sure you discuss any questions you have with your health care provider.

## 2014-03-19 NOTE — Assessment & Plan Note (Signed)
Some crepitus present on exam. Exam and symptoms are consistent with possible arthritis. No evidence of rotator cuff pathology at this time. Obtain shoulder x-ray. Consider referral to sports medicine for her shoulder pain as well.

## 2014-03-19 NOTE — Assessment & Plan Note (Signed)
Headaches are improved. She continues on the Imitrex and Topamax at this time. Continues to have headaches, would like to increase the Topamax to see if headaches will decrease.  Increase Topamax to 100 mg daily. Continue Imitrex at current dose. Increase Phenergan to 25 mg as needed. Follow up in 2-3 months pending headaches.

## 2014-03-19 NOTE — Telephone Encounter (Signed)
Please call patient informed her that her x-rays were both negative for fracture as anticipated. I have placed a referral for sports medicine, and she should be hearing back in 7-10 days regarding an appointment with Dr. Katrinka BlazingSmith.

## 2014-03-19 NOTE — Telephone Encounter (Signed)
Please inform patient of her lab work has come back and is within the normal expected ranges. No changes her medication are needed at this time.

## 2014-03-19 NOTE — Assessment & Plan Note (Signed)
1) Anticipatory Guidance: Discussed importance of wearing a seatbelt while driving and not texting while driving; changing batteries in smoke detector at least once annually; wearing suntan lotion when outside; eating a balanced and moderate diet; getting physical activity at least 30 minutes per day.  2) Immunizations / Screenings / Labs:  Flu shot is up-to-date. Patient will consider tetanus shot. All other immunizations are up-to-date. He is due for a vision exam. All other screenings are up-to-date. Obtain CBC, BMET, Lipid profile and TSH.     Overall well exam.continues to have the risk factor of smoking. Discussed smoking cessation options. She will investigate the options and we'll discuss at next coming appointment. Continue healthy lifestyle choices. Next prevention exam in one year. Follow up with acute conditions sooner if needed.

## 2014-03-19 NOTE — Progress Notes (Signed)
Pre visit review using our clinic review tool, if applicable. No additional management support is needed unless otherwise documented below in the visit note. 

## 2014-03-19 NOTE — Telephone Encounter (Signed)
Tried calling pt. No VM will try back later 

## 2014-03-19 NOTE — Assessment & Plan Note (Signed)
Symptoms and exam consistent with possible arthritis. Obtain x-ray of right hip. Continue over-the-counter medications as needed for discomfort. Refer to sports medicine pending x-rays.

## 2014-03-19 NOTE — Progress Notes (Signed)
Subjective:    Patient ID: Madison Garza, female    DOB: 1975/12/18, 38 y.o.   MRN: [id number]  Chief Complaint  Patient presents with  . CPE    still having headaches come and go with meds    HPI:  Madison ShorterDanae A Zarazua is a 38 y.o. female who presents today for an annual wellness visit.   1) Health Maintenance - Overall feeling okay. Having pain in hips and shoulders.   Diet - Hasn't really been able to eat a lot. Exercise - Not really exercising  Wt Readings from Last 3 Encounters:  03/19/14 128 lb 12.8 oz (58.423 kg)  02/25/14 130 lb (58.968 kg)  02/19/14 129 lb (58.514 kg)   2) Preventative Exams / Immunizations:  Dental -- Up to date Vision -- Due for exam.    Health Maintenance  Topic Date Due  . TETANUS/TDAP  01/25/1994  . INFLUENZA VACCINE  11/15/2013  . PAP SMEAR  08/30/2015  Tetanus shot addressed   Immunization History  Administered Date(s) Administered  . Influenza,inj,Quad PF,36+ Mos 03/19/2014   3) Headaches - Continues to have some headaches  Notes that headaches have been decreased since starting the Topamax and notes the Imitrex is helping when the headaches do occur.    Review of Systems  Constitutional: Denies fever, chills, fatigue, or significant weight gain/loss. HENT: Head: Denies headache or neck pain Ears: Denies changes in hearing, ringing in ears, earache, drainage Nose: Denies discharge, stuffiness, itching, nosebleed, sinus pain Throat: Denies sore throat, hoarseness, dry mouth, sores, thrush Eyes: Denies loss/changes in vision, pain, redness, blurry/double vision, flashing lights Cardiovascular: Denies chest pain/discomfort, tightness, palpitations, shortness of breath with activity, difficulty lying down, swelling, sudden awakening with shortness of breath Respiratory: Denies shortness of breath, cough, sputum production, wheezing Gastrointestinal: Denies dysphasia, heartburn, change in appetite, nausea, change in bowel habits,  rectal bleeding, constipation, diarrhea, yellow skin or eyes Genitourinary: Denies frequency, urgency, burning/pain, blood in urine, incontinence, change in urinary strength. Musculoskeletal: Denies muscle/joint pain (other than below), stiffness, back pain, redness or swelling of joints, trauma 1) right shoulder-indicates right shoulder snaps and cracks every now and then and general aching feeling. Denies any trauma to the shoulder. Indicates he continually aches despite over-the-counter medication. Describes the worst spot is deep inside the joint. 2) Right hip - denies trauma. Does not recall how long it has been hurting her. Indicates pain feels like it is deep in the joint. Has not done any treatments. Bending over increases the pain in her hip and gluteus maximus. Skin: Denies rashes, lumps, itching, dryness, color changes, or hair/nail changes Neurological: Denies dizziness, fainting, seizures, weakness, numbness, tingling, tremor Psychiatric - Denies nervousness, stress, depression or memory loss Endocrine: Denies heat or cold intolerance, sweating, frequent urination, excessive thirst, changes in appetite Hematologic: Denies ease of bruising or bleeding    Objective:    BP 108/68 mmHg  Pulse 69  Temp(Src) 98.6 F (37 C) (Oral)  Resp 18  Ht 5' 3.5" (1.613 m)  Wt 128 lb 12.8 oz (58.423 kg)  BMI 22.46 kg/m2  SpO2 98% Nursing note and vital signs reviewed.  Physical Exam  Constitutional: She is oriented to person, place, and time. She appears well-developed and well-nourished.  HENT:  Head: Normocephalic.  Right Ear: Hearing, tympanic membrane, external ear and ear canal normal.  Left Ear: Hearing, tympanic membrane, external ear and ear canal normal.  Nose: Nose normal.  Mouth/Throat: Uvula is midline, oropharynx is clear and moist and  mucous membranes are normal.  Eyes: Conjunctivae and EOM are normal. Pupils are equal, round, and reactive to light.  Neck: Neck supple. No JVD  present. No tracheal deviation present. No thyromegaly present.  Cardiovascular: Normal rate, regular rhythm, normal heart sounds and intact distal pulses.   Pulmonary/Chest: Effort normal and breath sounds normal.  Abdominal: Soft. Bowel sounds are normal. She exhibits no distension and no mass. There is no tenderness. There is no rebound and no guarding.  Musculoskeletal: Normal range of motion. She exhibits no edema or tenderness.  Right shoulder - no obvious deformity, discoloration, or edema noted. Exhibits full range of motion in all directions. Does have crepitus upon motion. Unable to elicit tenderness upon palpation.  Right hip - no obvious deformity, discoloration, or edema noted. Exhibits full range of motion in all directions. Increased pain with lumbar flexion. Unable to elicit tenderness.  Lymphadenopathy:    She has no cervical adenopathy.  Neurological: She is alert and oriented to person, place, and time. She has normal reflexes. No cranial nerve deficit. She exhibits normal muscle tone. Coordination normal.  Skin: Skin is warm and dry.  Psychiatric: She has a normal mood and affect. Her behavior is normal. Judgment and thought content normal.       Assessment & Plan:

## 2014-03-20 NOTE — Telephone Encounter (Signed)
Tried calling pt and no answer. No VM

## 2014-03-23 NOTE — Telephone Encounter (Signed)
Got a hold of pt and she is aware that lab work is normal and she should be hearing about the referral for sports medicine.

## 2014-04-01 ENCOUNTER — Telehealth: Payer: Self-pay | Admitting: Family

## 2014-04-01 MED ORDER — ALPRAZOLAM 0.25 MG PO TABS: 0.2500 mg | ORAL_TABLET | Freq: Two times a day (BID) | ORAL | Status: DC | PRN

## 2014-04-01 NOTE — Telephone Encounter (Signed)
Pt called in said that she needs a refill on her ALPRAZolam (XANAX) 0.25 MG tablet [[id number]]

## 2014-04-01 NOTE — Telephone Encounter (Signed)
Medication refilled

## 2014-04-06 ENCOUNTER — Telehealth: Payer: Self-pay | Admitting: *Deleted

## 2014-04-06 NOTE — Telephone Encounter (Signed)
[facility] - Client TELEPHONE ADVICE RECORD [facility] Patient Name: Madison Garza Gender: Female DOB: 11/01/1975 Age: [age] Y 2 M 10 D Return Phone Number: [phone] (Primary) Address: [address] City[place]State[place]Zip: [place] [address] Client [facility] - Client Client Site [facility] Contact Type Call Call Type Triage / Clinical Relationship To Patient Self Return Phone Number [phone] (Primary) Chief Complaint Prescription Refill or Medication Request (non symptomatic) Initial Comment caller states she needs a refill of her alprazolam PreDisposition Call Doctor Nurse Assessment Nurse: Lane HackerHarley, RN, Elvin SoWindy Date/Time Lamount Cohen(Eastern Time): 04/06/2014 12:22:35 PM Please select the assessment type ---Request for controlled medication refill Additional Documentation ---Caller states she needs a refill of her Alprazolam BID (#20 given last time on 03/19/14), but doctor just told her that the med had been refilled, and so issue resolved. She states also that she has just had a panic attack - can't breathe and chest heaviness and can't stop crying. She wanted to ask if it was another mg that could be added cause she feels like 2 is not enough. She has not been missed any doses as she has needed it. Is there an on-call physician for the client? ---Yes Do the client directives specifically allow for paging the on-call regarding scheduled drugs? ---No Additional Documentation ---RN triaged caller since request was "resolved" by her. Then she added that she is taking up to 6 a day for last 2 wks. off/on before it starts to help. Guidelines Guideline Title Affirmed Question Affirmed Notes Nurse Date/Time (Eastern Time) Heart Rate and Heartbeat Questions [1] Heart beating very rapidly (e.g., > 140 / minute) AND [2] not present now (Exception: during exercise) Lane HackerHarley, RN, Elvin SoWindy 04/06/2014 12:25:22 PM Disp.  Time Lamount Cohen(Eastern Time) Disposition Final User 04/06/2014 12:11:44 PM Attempt made - no message left Emelda FearHarley, RN, Windy 04/06/2014 12:21:42 PM Attempt made - no message left Emelda FearHarley, RN, Windy 04/06/2014 12:30:59 PM See Physician within 4 Hours (or PCP triage) Yes Lane HackerHarley, RN, Elvin SoWindy PLEASE NOTE: All timestamps contained within this report are represented as [place] Standard Time. CONFIDENTIALTY NOTICE: This fax transmission is intended only for the addressee. It contains information that is legally privileged, confidential or otherwise protected from use or disclosure. If you are not the intended recipient, you are strictly prohibited from reviewing, disclosing, copying using or disseminating any of this information or taking any action in reliance on or regarding this information. If you have received this fax in error, please notify us immediately by telephone so that we can arrange for its return to us. Phone: [phone], Toll-Free: [phone], Fax: [phone] Page: 2 of 2 Call Id: [id number] Caller Understands: Yes Disagree/Comply: Disagree Disagree/Comply Reason: Disagree with instructions Care Advice Given Per Guideline SEE PHYSICIAN WITHIN 4 HOURS (or PCP triage): * IF NO PCP TRIAGE: You need to be seen. Go to _______________ (ED/UCC or office if it will be open) within the next 3 or 4 hours. Go sooner if you become worse. CALL BACK IF: * You become worse. CARE ADVICE given per Palpitations (Adult) guideline. After Care Instructions Given Call Event Type User Date / Time Description Comments User: Rubie MaidWindy, Harley, RN Date/Time Lamount Cohen(Eastern Time): 04/06/2014 12:12:03 PM Voice mail is not set up yet. Unable to leave a msg. User: Rubie MaidWindy, Harley, RN Date/Time Lamount Cohen(Eastern Time): 04/06/2014 12:35:59 PM Caller feels like it is her anxiety that then makes her HR increase. And she wants med dose increased. -- RN advised that  med dose can not be increased w/o seeing the doctor first. Caller agreeable. [facility]  [facility] with Dr. Carver Filaalone tomorrow AM. RN advised caller to go to ER or UC if HR increases again before then. She verbalized understanding.

## 2014-04-06 NOTE — Telephone Encounter (Signed)
Patient states she is taking three or four xanax per day to get relief.  She would like to know if she could up her dosage.  I told her to take the med as prescribed until she hears different from [place].  Patient states she is having three or four panic attacks a day.  Please give patient a call back in regards.  Also, transferred patient to nurse line.

## 2014-04-07 ENCOUNTER — Ambulatory Visit (INDEPENDENT_AMBULATORY_CARE_PROVIDER_SITE_OTHER): Payer: BC Managed Care – PPO | Admitting: Family

## 2014-04-07 VITALS — BP 120/72 | HR 86 | Temp 98.4°F | Resp 20 | Wt 126.4 lb

## 2014-04-07 DIAGNOSIS — F411 Generalized anxiety disorder: Secondary | ICD-10-CM

## 2014-04-07 DIAGNOSIS — G43809 Other migraine, not intractable, without status migrainosus: Secondary | ICD-10-CM

## 2014-04-07 MED ORDER — PAROXETINE HCL 20 MG PO TABS: 20.0000 mg | ORAL_TABLET | Freq: Every day | ORAL | Status: DC

## 2014-04-07 MED ORDER — TOPIRAMATE 50 MG PO TABS: 50.0000 mg | ORAL_TABLET | Freq: Two times a day (BID) | ORAL | Status: DC

## 2014-04-07 MED ORDER — ALPRAZOLAM 0.5 MG PO TABS: 0.5000 mg | ORAL_TABLET | Freq: Every evening | ORAL | Status: DC | PRN

## 2014-04-07 NOTE — Progress Notes (Signed)
   Subjective:    Patient ID: Madison Garza, female    DOB: July 29, 1975, 38 y.o.   MRN: [id number]  Chief Complaint  Patient presents with  . Medication Refill    would like xanax refilled and the dosage increased. Very stressed, due to at home life.     HPI:  Madison Garza is a 38 y.o. female who presents today for follow up of anxiety.   Indicates that she is having continued amounts of stress from a vareity of sources including work and lifestyle. Has been taking up to 4 per day and having to take about 0.75 mg at a time. She does indicate that they do help her to gain her thought process. States she has had multiple possibilities of potential panic attacks.  No Known Allergies  Current Outpatient Prescriptions on File Prior to Visit  Medication Sig Dispense Refill  . ALPRAZolam (XANAX) 0.25 MG tablet Take 1 tablet (0.25 mg total) by mouth 2 (two) times daily as needed for anxiety. 60 tablet 0  . ondansetron (ZOFRAN) 4 MG tablet Take 1 tablet (4 mg total) by mouth every 8 (eight) hours as needed for nausea or vomiting. 9 tablet 0  . promethazine (PHENERGAN) 25 MG tablet Take 1 tablet (25 mg total) by mouth every 8 (eight) hours as needed for nausea or vomiting. 20 tablet 0  . SUMAtriptan (IMITREX) 100 MG tablet May repeat in 2 hours if headache persists or recurs. 10 tablet 2  . topiramate (TOPAMAX) 50 MG tablet Take 1 tablet (50 mg total) by mouth 2 (two) times daily. 60 tablet 0   No current facility-administered medications on file prior to visit.    Review of Systems    See HPI  Objective:    BP 120/72 mmHg  Pulse 86  Temp(Src) 98.4 F (36.9 C) (Oral)  Resp 20  Wt 126 lb 6.4 oz (57.335 kg)  SpO2 98%  LMP 03/05/2014 Nursing note and vital signs reviewed.  Physical Exam  Constitutional: She is oriented to person, place, and time. She appears well-developed and well-nourished. No distress.  Cardiovascular: Normal rate, regular rhythm, normal heart sounds and intact  distal pulses.   Pulmonary/Chest: Effort normal and breath sounds normal.  Neurological: She is alert and oriented to person, place, and time.  Skin: Skin is warm and dry.  Psychiatric: She has a normal mood and affect. Her behavior is normal. Judgment and thought content normal.      Assessment & Plan:

## 2014-04-07 NOTE — Patient Instructions (Signed)
Thank you for choosing [facility].  Summary/Instructions:  Your prescription(s) have been submitted to your pharmacy. Please take as directed and contact our office if you believe you are having problem(s) with the medication(s).  If your symptoms worsen or fail to improve, please contact our office for further instruction, or in case of emergency go directly to the emergency room at the closest medical facility.      

## 2014-04-07 NOTE — Progress Notes (Signed)
Pre visit review using our clinic review tool, if applicable. No additional management support is needed unless otherwise documented below in the visit note. 

## 2014-04-07 NOTE — Assessment & Plan Note (Signed)
Anxiety remains labile and affecting her daily life. Start paroxetine 20 mg daily. Patient advised of side effects of medication and if thoughts of suicide developed to seek immediate medical care. Continue Xanax and increased to 0.5 mg. Follow up in approximately one month to determine effectiveness of changes.

## 2014-04-07 NOTE — Telephone Encounter (Signed)
Pt was seen this morning to discuss the medication. All is taken care of.

## 2014-04-15 ENCOUNTER — Ambulatory Visit (INDEPENDENT_AMBULATORY_CARE_PROVIDER_SITE_OTHER): Payer: BC Managed Care – PPO | Admitting: Nurse Practitioner

## 2014-04-15 ENCOUNTER — Encounter: Payer: Self-pay | Admitting: Nurse Practitioner

## 2014-04-15 VITALS — BP 100/60 | HR 89 | Temp 98.1°F | Ht 63.5 in | Wt 122.0 lb

## 2014-04-15 DIAGNOSIS — R10819 Abdominal tenderness, unspecified site: Secondary | ICD-10-CM | POA: Insufficient documentation

## 2014-04-15 DIAGNOSIS — J208 Acute bronchitis due to other specified organisms: Secondary | ICD-10-CM | POA: Insufficient documentation

## 2014-04-15 DIAGNOSIS — R062 Wheezing: Secondary | ICD-10-CM

## 2014-04-15 DIAGNOSIS — R195 Other fecal abnormalities: Secondary | ICD-10-CM

## 2014-04-15 LAB — POCT URINALYSIS DIPSTICK
Glucose, UA: NEGATIVE
Ketones, UA: NEGATIVE
LEUKOCYTES UA: NEGATIVE
NITRITE UA: NEGATIVE
Protein, UA: NEGATIVE
RBC UA: NEGATIVE
Spec Grav, UA: >=1.03
Urobilinogen, UA: 1
pH, UA: 6

## 2014-04-15 MED ORDER — DOXYCYCLINE HYCLATE 100 MG PO TABS: 100.0000 mg | ORAL_TABLET | Freq: Two times a day (BID) | ORAL | Status: DC

## 2014-04-15 MED ORDER — IPRATROPIUM-ALBUTEROL 0.5-2.5 (3) MG/3ML IN SOLN
3.0000 mL | Freq: Once | RESPIRATORY_TRACT | Status: AC
  Administered 2014-04-15: 3 mL via RESPIRATORY_TRACT

## 2014-04-15 MED ORDER — GUAIFENESIN ER 600 MG PO TB12: 600.0000 mg | ORAL_TABLET | Freq: Two times a day (BID) | ORAL | Status: DC

## 2014-04-15 MED ORDER — SULFAMETHOXAZOLE-TRIMETHOPRIM 800-160 MG PO TABS: 1.0000 | ORAL_TABLET | Freq: Two times a day (BID) | ORAL | Status: DC

## 2014-04-15 MED ORDER — HYDROCODONE-HOMATROPINE 5-1.5 MG/5ML PO SYRP: 5.0000 mL | ORAL_SOLUTION | Freq: Every evening | ORAL | Status: DC | PRN

## 2014-04-15 MED ORDER — ALBUTEROL SULFATE HFA 108 (90 BASE) MCG/ACT IN AERS
2.0000 | INHALATION_SPRAY | Freq: Four times a day (QID) | RESPIRATORY_TRACT | Status: DC | PRN
Start: 2014-04-15 — End: 2016-01-21

## 2014-04-15 NOTE — Progress Notes (Signed)
Pre visit review using our clinic review tool, if applicable. No additional management support is needed unless otherwise documented below in the visit note. 

## 2014-04-15 NOTE — Progress Notes (Signed)
Subjective:    Patient ID: Madison Garza A Kussman, female    DOB: 08-29-1975, 38 y.o.   MRN: [id number]  HPI Comments: Husband & daughter sick  Cough This is a new problem. The current episode started 1 to 4 weeks ago (10 d). The problem has been gradually worsening. The problem occurs every few minutes. The cough is non-productive. Associated symptoms include chest pain (feels heavy), chills, headaches, nasal congestion, postnasal drip, a sore throat and wheezing. Pertinent negatives include no ear congestion, ear pain, fever, myalgias or shortness of breath. Associated symptoms comments: Loose stools for 5 days. The symptoms are aggravated by lying down. She has tried prescription cough suppressant (using husband's cough syrup) for the symptoms. The treatment provided no relief. 1/2 PPD smoker      Review of Systems  Constitutional: Positive for chills, appetite change and fatigue. Negative for fever.  HENT: Positive for congestion, postnasal drip, sinus pressure and sore throat. Negative for ear pain.   Respiratory: Positive for cough, chest tightness and wheezing. Negative for shortness of breath.   Cardiovascular: Positive for chest pain (feels heavy).  Gastrointestinal: Positive for nausea, abdominal pain and diarrhea. Negative for vomiting.  Musculoskeletal: Negative for myalgias and back pain.  Neurological: Positive for headaches.       Objective:   Physical Exam  Constitutional: She is oriented to person, place, and time. She appears well-developed and well-nourished. No distress.  HENT:  Head: Normocephalic and atraumatic.  Right Ear: External ear normal.  Left Ear: External ear normal.  Mouth/Throat: Oropharynx is clear and moist. No oropharyngeal exudate.  Eyes: Conjunctivae are normal. Right eye exhibits no discharge. Left eye exhibits no discharge.  Neck: Normal range of motion. Neck supple. No thyromegaly present.  Cardiovascular: Normal rate, regular rhythm and normal  heart sounds.   No murmur heard. Pulmonary/Chest: Effort normal. No respiratory distress. She has wheezes. She has no rales.  Wheezes cleared after breathing treatment  Abdominal: Soft. Bowel sounds are normal. She exhibits no distension and no mass. There is tenderness (suprapubic). There is no rebound and no guarding.  Lymphadenopathy:    She has no cervical adenopathy.  Neurological: She is alert and oriented to person, place, and time.  Skin: Skin is warm and dry.  Psychiatric: She has a normal mood and affect. Her behavior is normal. Thought content normal.  Vitals reviewed.         Assessment & Plan:  1. Acute bronchitis due to other specified organisms - albuterol (PROVENTIL HFA;VENTOLIN HFA) 108 (90 BASE) MCG/ACT inhaler; Inhale 2 puffs into the lungs every 6 (six) hours as needed for wheezing or shortness of breath.  Dispense: 1 Inhaler; Refill: 2 - guaiFENesin (MUCINEX) 600 MG 12 hr tablet; Take 1 tablet (600 mg total) by mouth 2 (two) times daily.  Dispense: 30 tablet; Refill: 0 - sulfamethoxazole-trimethoprim (BACTRIM DS,SEPTRA DS) 800-160 MG per tablet; Take 1 tablet by mouth 2 (two) times daily.  Dispense: 20 tablet; Refill: 0 - HYDROcodone-homatropine (HYCODAN) 5-1.5 MG/5ML syrup; Take 5 mLs by mouth at bedtime as needed for cough.  Dispense: 120 mL; Refill: 0 - ipratropium-albuterol (DUONEB) 0.5-2.5 (3) MG/3ML nebulizer solution 3 mL; Take 3 mLs by nebulization once.  2. Abdominal tenderness DD: UTI, diverticulitis, PID - POCT urinalysis dipstick-nml - Urine culture-pending Bactrim may cover if UTI, will monitor culture  3. Loose stools DD: viral gastroenteritis, diverticulitis, UTI  4. Wheezing - ipratropium-albuterol (DUONEB) 0.5-2.5 (3) MG/3ML nebulizer solution 3 mL; Take 3 mLs by nebulization  once.  F/u 10 days

## 2014-04-15 NOTE — Patient Instructions (Addendum)
For respiratory infection:  Start antibiotic (Bactrim). Eat yogurt daily to help prevent antibiotic -associated diarrhea. Eat applesauce daily to help bind stool. Avoid apple juice as it makes diarrhea worse.  Start guaifenesen to keep secretions loose. Sip fluids every hour for best results.  Use inhaler twice daily for 3-4 days then as needed for persistent cough.  Use cough syrup at night only so you can rest.  Start daily sinus rinses (Neilmed sinus rinse).  My office will call with urine results. Antibiotic will clear infection.

## 2014-04-21 ENCOUNTER — Ambulatory Visit (INDEPENDENT_AMBULATORY_CARE_PROVIDER_SITE_OTHER): Payer: BLUE CROSS/BLUE SHIELD | Admitting: Family Medicine

## 2014-04-21 ENCOUNTER — Encounter: Payer: Self-pay | Admitting: Family Medicine

## 2014-04-21 ENCOUNTER — Other Ambulatory Visit (INDEPENDENT_AMBULATORY_CARE_PROVIDER_SITE_OTHER): Payer: BLUE CROSS/BLUE SHIELD

## 2014-04-21 VITALS — BP 92/64 | HR 84 | Ht 63.0 in | Wt 122.0 lb

## 2014-04-21 DIAGNOSIS — M25511 Pain in right shoulder: Secondary | ICD-10-CM

## 2014-04-21 DIAGNOSIS — M7550 Bursitis of unspecified shoulder: Secondary | ICD-10-CM | POA: Insufficient documentation

## 2014-04-21 DIAGNOSIS — M7551 Bursitis of right shoulder: Secondary | ICD-10-CM

## 2014-04-21 DIAGNOSIS — M25551 Pain in right hip: Secondary | ICD-10-CM

## 2014-04-21 DIAGNOSIS — M6580 Other synovitis and tenosynovitis, unspecified site: Secondary | ICD-10-CM

## 2014-04-21 DIAGNOSIS — M6289 Other specified disorders of muscle: Secondary | ICD-10-CM | POA: Insufficient documentation

## 2014-04-21 MED ORDER — MELOXICAM 15 MG PO TABS: 15.0000 mg | ORAL_TABLET | Freq: Every day | ORAL | Status: DC

## 2014-04-21 NOTE — Progress Notes (Signed)
[id number]; 15 additional minutes spent for Therapeutic exercises as stated in above notes.  This included exercises focusing on stretching, strengthening, with significant focus on eccentric aspects.   Proper technique shown and discussed handout in great detail with ATC.  All questions were discussed and answered. This is for both her shoulder and hip.

## 2014-04-21 NOTE — Assessment & Plan Note (Signed)
Mild bursitis noted. Discussed with patient about different treatment options and patient elected for conservative therapy. Patient home exercises, icing protocol, and we showed proper technique with athletic trainer on home exercise program. Patient will try these different changes and anti-inflammatory that was prescribed today. Patient will come back and see me again in 3 weeks for further evaluation. Patient may be a candidate for osteopathic manipulation.

## 2014-04-21 NOTE — Progress Notes (Signed)
Tawana ScaleZach Smith D.O. [facility] [address] [place], [place] [address] Phone: [phone] Subjective:    I'm seeing this patient by the request  of:  Jeanine Luzalone, Gregory, FNP   CC: Shoulder and hip pain.   UXL:[id number]:Subjective Ogechi A Malen GauzeFoster is a 39 y.o. female coming in with complaint of  Right shoulder and right hip pain.   Regarding patient's right shoulder she states that this is been an insidious onset over the course of numerous years. Patient discusses it as a dull aching pain that seems to be intermittent. Does not remember any injury. States that a can affect her activity sometimes. Rates the severity of pain is 4 out of 10. Does not wake her up at night. Can stop her from certain activities. Patient is tried over-the-counter medications with no significant improvement. Denies any weakness or numbness or any significant radiation down the arm.   Right hip pain-  Patient states that this has been intermittent as well. Has a snapping sensation sometimes. States that it seems to be associated sometimes with her back tightness. States that after sitting a long amount of time he can give her some uncomfortable feeling. Patient states it's going up and down stairs repetitively she can feel like it can give out on her. Denies any radiation or any numbness. Patient rates the severity of 4 out of 10. Denies any weakness.     Past medical history, social, surgical and family history all reviewed in electronic medical record.   Review of Systems: No headache, visual changes, nausea, vomiting, diarrhea, constipation, dizziness, abdominal pain, skin rash, fevers, chills, night sweats, weight loss, swollen lymph nodes, body aches, joint swelling, muscle aches, chest pain, shortness of breath, mood changes.   Objective Blood pressure 92/64, pulse 84, height 5\' 3"  (1.6 m), weight 122 lb (55.339 kg), SpO2 97 %.  General: No apparent distress alert and oriented x3 mood and affect normal, dressed  appropriately.  HEENT: Pupils equal, extraocular movements intact  Respiratory: Patient's speak in full sentences and does not appear short of breath  Cardiovascular: No lower extremity edema, non tender, no erythema  Skin: Warm dry intact with no signs of infection or rash on extremities or on axial skeleton.  Abdomen: Soft nontender  Neuro: Cranial nerves II through XII are intact, neurovascularly intact in all extremities with 2+ DTRs and 2+ pulses.  Lymph: No lymphadenopathy of posterior or anterior cervical chain or axillae bilaterally.  Gait normal with good balance and coordination.  MSK:  Non tender with full range of motion and good stability and symmetric strength and tone of elbows, wrist,  knee and ankles bilaterally.  Shoulder: right Inspection reveals no abnormalities, atrophy or asymmetry. Palpation is normal with no tenderness over AC joint or bicipital groove. ROM is full in all planes. Rotator cuff strength normal throughout.  positive impingement signs Speeds and Yergason's tests normal. No labral pathology noted with negative Obrien's, negative clunk and good stability. Normal scapular function observed. No painful arc and no drop arm sign. No apprehension sign   Hip: right ROM IR: 45 Deg, ER: 45 Deg, Flexion: 120 Deg, Extension: 100 Deg, Abduction: 45 Deg, Adduction: 25 Deg Strength IR: 5/5, ER: 4/5, Flexion: 5/5, Extension: 5/5, Abduction: 4/5, Adduction: 4/5 Pelvic alignment unremarkable to inspection and palpation. Standing hip rotation and gait without trendelenburg sign / unsteadiness. Greater trochanter  Mild tenderness to palpation as well as over the tensor fascia lata No tenderness over piriformis and greater trochanter.  positive  Faber No SI joint tenderness and normal minimal SI movement.  negative straight leg  MSK US performed of: right This study was ordered, performed, and interpreted by Terrilee Files D.O.  Shoulder:   Supraspinatus:  Mild  hypoechoic changes. Tendon intact. Bursa noted. Infraspinatus:  Appears normal on long and transverse views. Subscapularis:  Appears normal on long and transverse views. Teres Minor:  Appears normal on long and transverse views. AC joint:  Capsule undistended, no geyser sign. Glenohumeral Joint:  Appears normal without effusion. Glenoid Labrum:  Intact without visualized tears. Biceps Tendon:  Appears normal on long and transverse views, no fraying of tendon, tendon located in intertubercular groove, no subluxation with shoulder internal or external rotation. No increased power doppler signal.  Impression: Mild subacromial bursitis.    Impression and Recommendations:     This case required medical decision making of moderate complexity.

## 2014-04-21 NOTE — Patient Instructions (Signed)
Good to meet you Ice 20 minutes 2 times daily. Usually after activity and before bed. Exercises 3 times a week.  Alternate between hip and shoulder  Watch your sleeping position.  Meloxicam daily for 10 days then as needed See me again in 3 weeks.

## 2014-04-21 NOTE — Assessment & Plan Note (Signed)
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 We discussed with patient about strengthening the hip abductors and stretching the hip flexors. Patient did go over home exercises with athletic trainer in great detail today. Patient will try to make these changes as well as anti-inflammatories and icing protocol. Patient and will come back and see me again in 3 weeks for further evaluation.

## 2014-05-07 ENCOUNTER — Other Ambulatory Visit: Payer: Self-pay | Admitting: Internal Medicine

## 2014-05-18 ENCOUNTER — Other Ambulatory Visit: Payer: Self-pay | Admitting: Family

## 2014-05-25 ENCOUNTER — Telehealth: Payer: Self-pay | Admitting: Family

## 2014-05-25 MED ORDER — PAROXETINE HCL 20 MG PO TABS: 20.0000 mg | ORAL_TABLET | Freq: Every day | ORAL | Status: DC

## 2014-05-25 MED ORDER — ALPRAZOLAM 0.5 MG PO TABS
0.5000 mg | ORAL_TABLET | Freq: Two times a day (BID) | ORAL | Status: DC | PRN
Start: 2014-05-25 — End: 2015-12-19

## 2014-05-25 NOTE — Telephone Encounter (Signed)
Patient wishes to speak to you regarding medicine issues. She would not give specifics

## 2014-05-25 NOTE — Telephone Encounter (Signed)
Called pt and let her know.

## 2014-05-25 NOTE — Telephone Encounter (Signed)
Medications refilled

## 2014-05-25 NOTE — Telephone Encounter (Signed)
Can pt have refill of xanax and paxil? Has no insurance and wants enough to last for a month until she can get medicaid.

## 2014-09-01 ENCOUNTER — Other Ambulatory Visit: Payer: Self-pay | Admitting: Physician Assistant

## 2014-09-01 DIAGNOSIS — R102 Pelvic and perineal pain: Secondary | ICD-10-CM

## 2014-09-08 ENCOUNTER — Other Ambulatory Visit: Payer: Self-pay

## 2014-09-08 ENCOUNTER — Ambulatory Visit
Admission: RE | Admit: 2014-09-08 | Discharge: 2014-09-08 | Disposition: A | Discharge status: Home / Self Care | Payer: Medicaid Other | Source: Ambulatory Visit | Attending: Physician Assistant | Admitting: Physician Assistant

## 2014-09-08 DIAGNOSIS — R102 Pelvic and perineal pain: Secondary | ICD-10-CM

## 2014-09-28 ENCOUNTER — Other Ambulatory Visit: Payer: Self-pay | Admitting: Internal Medicine

## 2014-09-28 DIAGNOSIS — R102 Pelvic and perineal pain: Secondary | ICD-10-CM

## 2014-09-30 ENCOUNTER — Ambulatory Visit: Payer: Medicaid Other

## 2014-10-20 ENCOUNTER — Ambulatory Visit: Payer: Medicaid Other

## 2014-10-23 ENCOUNTER — Ambulatory Visit
Admission: RE | Admit: 2014-10-23 | Discharge: 2014-10-23 | Disposition: A | Discharge status: Home / Self Care | Payer: Medicaid Other | Source: Ambulatory Visit | Attending: Internal Medicine | Admitting: Internal Medicine

## 2014-10-23 DIAGNOSIS — R102 Pelvic and perineal pain: Secondary | ICD-10-CM

## 2014-10-23 MED ORDER — IOPAMIDOL (ISOVUE-300) INJECTION 61%
100.0000 mL | Freq: Once | INTRAVENOUS | Status: AC | PRN
  Administered 2014-10-23: 100 mL via INTRAVENOUS

## 2015-06-28 ENCOUNTER — Emergency Department (HOSPITAL_COMMUNITY)
Admission: EM | Admit: 2015-06-28 | Discharge: 2015-06-28 | Disposition: A | Discharge status: Home / Self Care | Payer: Medicaid Other | Attending: Emergency Medicine | Admitting: Emergency Medicine

## 2015-06-28 ENCOUNTER — Encounter (HOSPITAL_COMMUNITY): Payer: Self-pay | Admitting: Emergency Medicine

## 2015-06-28 DIAGNOSIS — Z79899 Other long term (current) drug therapy: Secondary | ICD-10-CM | POA: Diagnosis not present

## 2015-06-28 DIAGNOSIS — Z791 Long term (current) use of non-steroidal anti-inflammatories (NSAID): Secondary | ICD-10-CM | POA: Insufficient documentation

## 2015-06-28 DIAGNOSIS — F1410 Cocaine abuse, uncomplicated: Secondary | ICD-10-CM | POA: Diagnosis not present

## 2015-06-28 DIAGNOSIS — F319 Bipolar disorder, unspecified: Secondary | ICD-10-CM | POA: Diagnosis not present

## 2015-06-28 DIAGNOSIS — F17200 Nicotine dependence, unspecified, uncomplicated: Secondary | ICD-10-CM | POA: Insufficient documentation

## 2015-06-28 DIAGNOSIS — F1110 Opioid abuse, uncomplicated: Secondary | ICD-10-CM | POA: Insufficient documentation

## 2015-06-28 DIAGNOSIS — Z792 Long term (current) use of antibiotics: Secondary | ICD-10-CM | POA: Diagnosis not present

## 2015-06-28 DIAGNOSIS — F1910 Other psychoactive substance abuse, uncomplicated: Secondary | ICD-10-CM | POA: Insufficient documentation

## 2015-06-28 DIAGNOSIS — F419 Anxiety disorder, unspecified: Secondary | ICD-10-CM | POA: Diagnosis present

## 2015-06-28 LAB — COMPREHENSIVE METABOLIC PANEL
ALT: 31 U/L (ref 14–54)
AST: 32 U/L (ref 15–41)
Albumin: 3.7 g/dL (ref 3.5–5.0)
Alkaline Phosphatase: 83 U/L (ref 38–126)
Anion gap: 8 (ref 5–15)
BUN: 13 mg/dL (ref 6–20)
CHLORIDE: 109 mmol/L (ref 101–111)
CO2: 20 mmol/L — ABNORMAL LOW (ref 22–32)
Calcium: 8.7 mg/dL — ABNORMAL LOW (ref 8.9–10.3)
Creatinine, Ser: 0.59 mg/dL (ref 0.44–1.00)
GFR calc Af Amer: >60 mL/min (ref >60)
Glucose, Bld: 172 mg/dL — ABNORMAL HIGH (ref 65–99)
POTASSIUM: 3.6 mmol/L (ref 3.5–5.1)
Sodium: 137 mmol/L (ref 135–145)
TOTAL PROTEIN: 7.2 g/dL (ref 6.5–8.1)
Total Bilirubin: 0.8 mg/dL (ref 0.3–1.2)

## 2015-06-28 LAB — CBC WITH DIFFERENTIAL/PLATELET
BASOS ABS: 0 10*3/uL (ref 0.0–0.1)
Basophils Relative: 0 %
EOS ABS: 0.1 10*3/uL (ref 0.0–0.7)
EOS PCT: 1 %
HCT: 46.6 % — ABNORMAL HIGH (ref 36.0–46.0)
HEMOGLOBIN: 15.2 g/dL — AB (ref 12.0–15.0)
Lymphocytes Relative: 33 %
Lymphs Abs: 2.2 10*3/uL (ref 0.7–4.0)
MCH: 28.8 pg (ref 26.0–34.0)
MCHC: 32.6 g/dL (ref 30.0–36.0)
MCV: 88.3 fL (ref 78.0–100.0)
Monocytes Absolute: 0.3 10*3/uL (ref 0.1–1.0)
Monocytes Relative: 4 %
NEUTROS PCT: 62 %
Neutro Abs: 4 10*3/uL (ref 1.7–7.7)
PLATELETS: 325 10*3/uL (ref 150–400)
RBC: 5.28 MIL/uL — ABNORMAL HIGH (ref 3.87–5.11)
RDW: 15.1 % (ref 11.5–15.5)
WBC: 6.5 10*3/uL (ref 4.0–10.5)

## 2015-06-28 NOTE — ED Provider Notes (Signed)
CSN: [id number]     Arrival date & time 06/28/15  1337 History  By signing my name below, I, Soijett Blue, attest that this documentation has been prepared under the direction and in the presence of Cheri [place], PA-C Electronically Signed: Soijett Blue, ED Scribe. 06/28/2015. 4:22 PM.   Chief Complaint  Patient presents with  . Anxiety  . Panic Attack      The history is provided by the patient and a relative. No language interpreter was used.    Madison Garza is a 40 y.o. female with a medical hx of bipolar 1 disorder, alcohol abuse (remote), and drug abuse, who presents to the Emergency Department complaining of anxiety and panic attacks onset PTA. Pt family states that they would like help in detox for the pt from opiates, molly, and cocaine. Family was informed that the pt would have to come to [facility] and detox for 72 hours prior to be sent to a detox facility. Pt states that she last used cocaine, heroine, and molly 2 days ago. Pt denies alcohol use or any other pain medications being used. Pt states that she is having withdrawals and that she has been at [facility] 3 years ago for her symptoms. Pt denies seizure withdrawals. She notes that she has not tried any medications for the relief of her symptoms. She denies gait problem, CP, SOB, abdominal pain, weakness, numbness, SI/HI, and any other symptoms.    Past Medical History  Diagnosis Date  . Headache(784.0)   . Depression   . Bipolar 1 disorder (HCC)   . Alcohol abuse   . Drug abuse    Past Surgical History  Procedure Laterality Date  . Breast enhancement surgery     History reviewed. No pertinent family history. Social History  Substance Use Topics  . Smoking status: Current Every Day Smoker -- 1.00 packs/day  . Smokeless tobacco: Never Used  . Alcohol Use: 42.0 oz/week    70 Cans of beer per week   OB History    Gravida Para Term Preterm AB TAB SAB Ectopic Multiple Living   Review of Systems   Cardiovascular: Negative for chest pain.  Gastrointestinal: Negative for abdominal pain.  Musculoskeletal: Negative for gait problem.  Neurological: Negative for numbness.  Psychiatric/Behavioral: Negative for suicidal ideas.       No HI      Allergies  Review of patient's allergies indicates no known allergies.  Home Medications   Prior to Admission medications   Medication Sig Start Date End Date Taking? Authorizing Provider  albuterol (PROVENTIL HFA;VENTOLIN HFA) 108 (90 BASE) MCG/ACT inhaler Inhale 2 puffs into the lungs every 6 (six) hours as needed for wheezing or shortness of breath. 04/15/14   Kelle Darting, NP  ALPRAZolam Prudy Feeler) 0.5 MG tablet Take 1 tablet (0.5 mg total) by mouth 2 (two) times daily as needed for anxiety. 05/25/14   Veryl Speak, FNP  busPIRone (BUSPAR) 7.5 MG tablet  02/05/14   Historical Provider, MD  guaiFENesin (MUCINEX) 600 MG 12 hr tablet Take 1 tablet (600 mg total) by mouth 2 (two) times daily. 04/15/14   Kelle Darting, NP  HYDROcodone-homatropine (HYCODAN) 5-1.5 MG/5ML syrup Take 5 mLs by mouth at bedtime as needed for cough. 04/15/14   Kelle Darting, NP  meloxicam (MOBIC) 15 MG tablet Take 1 tablet (15 mg total) by mouth daily. 04/21/14   Judi Saa, DO  PARoxetine (PAXIL) 20 MG tablet Take 1 tablet (20 mg total) by mouth daily. 05/25/14   Veryl SpeakGregory D Calone, FNP  promethazine (PHENERGAN) 25 MG tablet Take 1 tablet (25 mg total) by mouth every 8 (eight) hours as needed for nausea or vomiting. 03/19/14   Veryl SpeakGregory D Calone, FNP  sulfamethoxazole-trimethoprim (BACTRIM DS,SEPTRA DS) 800-160 MG per tablet Take 1 tablet by mouth 2 (two) times daily. 04/15/14   Kelle DartingLayne C Weaver, NP  SUMAtriptan (IMITREX) 100 MG tablet TAKE AS DIRECTED. MAY REPEAT IN TWO HOURS IF HEADACHE PERSISTS OR RECURS. DO NOT BECOME PREGNANT WHILE ON THIS MED 05/07/14   Veryl SpeakGregory D Calone, FNP  SUMAtriptan (IMITREX) 50 MG tablet  02/05/14   Historical Provider, MD  topiramate (TOPAMAX) 50  MG tablet Take 1 tablet (50 mg total) by mouth 2 (two) times daily. 04/07/14   Veryl SpeakGregory D Calone, FNP   BP 130/89 mmHg  Pulse 87  Temp(Src) 97.9 F (36.6 C) (Oral)  Resp 30  SpO2 100% Physical Exam  Constitutional: She is oriented to person, place, and time. She appears well-developed and well-nourished.  Non-toxic appearance. She does not have a sickly appearance. She does not appear ill.  HENT:  Head: Normocephalic and atraumatic.  Mouth/Throat: Oropharynx is clear and moist.  Eyes: Conjunctivae are normal.  Neck: Normal range of motion. Neck supple.  Cardiovascular: Normal rate, regular rhythm and normal heart sounds.   No murmur heard. Pulmonary/Chest: Effort normal and breath sounds normal. No accessory muscle usage or stridor. No respiratory distress. She has no wheezes. She has no rhonchi. She has no rales.  Abdominal: Soft. Bowel sounds are normal. She exhibits no distension. There is no tenderness. There is no rebound and no guarding.  Musculoskeletal: Normal range of motion.  Lymphadenopathy:    She has no cervical adenopathy.  Neurological: She is alert and oriented to person, place, and time.  Speech clear without dysarthria.  Skin: Skin is warm and dry.  Psychiatric: She has a normal mood and affect. Her behavior is normal.  Nursing note and vitals reviewed.   ED Course  Procedures (including critical care time) DIAGNOSTIC STUDIES: Oxygen Saturation is 100% on RA, nl by my interpretation.    COORDINATION OF CARE: 4:20 PM Discussed treatment plan with pt at bedside which includes labs and pt agreed to plan.    Labs Review Labs Reviewed  COMPREHENSIVE METABOLIC PANEL - Abnormal; Notable for the following:    CO2 20 (*)    Glucose, Bld 172 (*)    Calcium 8.7 (*)    All other components within normal limits  CBC WITH DIFFERENTIAL/PLATELET - Abnormal; Notable for the following:    RBC 5.28 (*)    Hemoglobin 15.2 (*)    HCT 46.6 (*)    All other components  within normal limits    Imaging Review No results found. I have personally reviewed and evaluated these lab results as part of my medical decision-making.   EKG Interpretation None      MDM   Final diagnoses:  Opioid abuse  Substance abuse    VSS, NAD.  Patient requesting detox.  Given IVDU hx, will obtain CMP and CBC.  Denies EtOH.  On exam, heart RRR, lungs CTAB, abdomen soft and benign.  Labs without acute abnormalities.  Patient will be given outpatient resource guide of detox.  Return precautions discussed.  Provided patient and patient's family member with lab results and Medicaid subscriber ID.  I personally performed the services described in this  documentation, which was scribed in my presence. The recorded information has been reviewed and is accurate.    Cheri [place], PA-C 06/28/15 1914  Benjiman Core, MD 06/29/15 [phone]

## 2015-06-28 NOTE — ED Notes (Signed)
Patient presented alert and oriented x4, able to stand/walk without gait abnormalities, able to follow commands when told to slow breathing. All vitals stable. Pt family member states the patient's children told them she was acting funny yesterday while they were out of town. Patient states she did heroin and molly a couple days ago, but hasn't had any today. Alerted MD to situation. Due to no clinical signs of overdose, placed patient out in lobby at this time to await a room.

## 2015-06-28 NOTE — ED Notes (Addendum)
Pt family brought her in for fear of drug overdose. Pt states she thinks she's having a panic attack, states she hasn't used any drugs in the last couple of days, vitals stable in triage. Hyperventilating in triage. C/o pain in knees and lower back. Denies thoughts of wanting to harm self or others.

## 2015-06-28 NOTE — Discharge Instructions (Signed)
Opioid Withdrawal  Opioids are a group of narcotic drugs. They include the street drug heroin. They also include pain medicines, such as morphine, hydrocodone, oxycodone, and fentanyl. Opioid withdrawal is a group of characteristic physical and mental signs and symptoms. It typically occurs if you have been using opioids daily for several weeks or longer and stop using or rapidly decrease use. Opioid withdrawal can also occur if you have used opioids daily for a long time and are given a medicine to block the effect.  SIGNS AND SYMPTOMS  Opioid withdrawal includes three or more of the following symptoms:  Depressed, anxious, or irritable mood.  Nausea or vomiting.  Muscle aches or spasms.  Watery eyes.  Runny nose.  Dilated pupils, sweating, or hairs standing on end.  Diarrhea or intestinal cramping.  Yawning.  Fever.  Increased blood pressure.  Fast pulse.  Restlessness or trouble sleeping. These signs and symptoms occur within several hours of stopping or reducing short-acting opioids, such as heroin. They can occur within 3 days of stopping or reducing long-acting opioids, such as methadone. Withdrawal begins within minutes of receiving a drug that blocks the effects of opioids, such as naltrexone or naloxone.  DIAGNOSIS  Opioid use disorder is diagnosed by your health care provider. You will be asked about your symptoms, drug and alcohol use, medical history, and use of medicines. A physical exam may be done. Lab tests may be ordered. Your health care provider may have you see a mental health professional.  TREATMENT  The treatment for opioid withdrawal is usually provided by medical doctors with special training in substance use disorders (addiction specialists). The following medicines may be included in treatment:  Opioids given in place of the abused opioid. They turn on opioid receptors in the brain and lessen or prevent withdrawal symptoms. They are gradually decreased (opioid  substitution and taper).  Non-opioids that can lessen certain opioid withdrawal symptoms. They may be used alone or with opioid substitution and taper. Successful long-term recovery usually requires medicine, counseling, and group support.  HOME CARE INSTRUCTIONS  Take medicines only as directed by your health care provider.  Check with your health care provider before starting new medicines.  Keep all follow-up visits as directed by your health care provider. SEEK MEDICAL CARE IF:  You are not able to take your medicines as directed.  Your symptoms get worse.  You relapse. SEEK IMMEDIATE MEDICAL CARE IF:  You have serious thoughts about hurting yourself or others.  You have a seizure.  You lose consciousness. This information is not intended to replace advice given to you by your health care provider. Make sure you discuss any questions you have with your health care provider.  Document Released: 04/06/2003 Document Revised: 04/24/2014 Document Reviewed: 04/16/2013  Elsevier Interactive Patient Education 2016 [employer]    State Street CorporationCommunity Resource Guide Outpatient Counseling/Substance Abuse Adult The United Ways 211 is a great source of information about community services available.  Access by dialing 2-1-1 from anywhere in [place], or by website -  PooledIncome.plwww.nc211.org.   Other Local Resources (Updated 04/2015)  Crisis Hotlines   Services     Area Served  [employer] [facility]  Crisis Hotline, available 24 hours a day, 7 days a week: 830-052-41134105041039 [facility], [place]   [facility]  Crisis Hotline, available 24 hours a day, 7 days a week: 6813524572949-139-1662 [facility], [place]  [facility]  Suicide Prevention Hotline, available 24 hours a day, 7 days a week: 510-169-6409573-223-4770 [facility]  [place], [place]  [employer], available 24 hours a day, 7 days a week: (561)668-4499 [facility], [place]   [facility] Access to [employer] Hotline,  available 24 hours a day, 7 days a week: (484)614-4952 All   [facility]  Crisis Hotline, available 24 hours a day, 7 days a week: 938-179-4865 All   Other Local Resources (Updated 04/2015)  Outpatient Counseling/ Substance Abuse Programs  Services     Address and Phone Number  ADS (Alcohol and Drug Services)   Options include Individual counseling, group counseling, intensive outpatient program (several hours a day, several days a week)  Offers depression assessments  Provides methadone maintenance program [phone] [address] [place], [place] [address]   [facility]   Offers partial hospitalization/day treatment and DUI/DWI programs  [employer], private insurance [phone] [address] [place], [place] [address]  [facility]    Services include intensive outpatient program (several hours a day, several days a week), outpatient treatment, DUI/DWI services, family education  Also has some services specifically for [employer] transitional housing  [phone] [address] [place], [place] [address]     [facility]  [employer], private pay, and private insurance [phone] [address] [place], [place] [address]  [employer] of Care  Services include individual counseling, substance abuse intensive outpatient program (several hours a day, several days a week), day treatment  Delene Loll, Medicaid, private insurance [phone] [address] [place], [place] [address]  [facility]   Offers substance abuse intensive outpatient program (several hours a day, several days a week), partial hospitalization program [phone] [address] [place], [place] [address]  [phone] [address] [place], [place] [address]  [phone] [address] [place], [place]  [address]  [phone], [address] [place], [place] [address]  [facility]  Individual counseling only  Accepts private insurance only [phone] [address] [place], [place] [address]  [facility]  Methadone maintenance program [phone] [address] [place], [place] [address]  [facility]  Walk-In Clinic providing substance abuse and mental health counseling  Accepts Medicaid, Medicare, private insurance  Offers sliding scale for uninsured [phone] [address] [place], [place]   [facility] [place], [employer].  Offers individual counseling, and intensive in-home services [phone] [address] [place], [place] [address]  [facility] individual counseling, family counseling, group therapy, domestic violence counseling, consumer credit counseling  Accepts Medicare, Medicaid, private insurance  Offers sliding scale for uninsured [phone] [address] [place], [place] [address]  [phone] [facility], [address] [place], [place] [address]  [facility]  Offers individual, family and group counseling  3 locations - [place], [place], and [place]  [phone]  [address] [place], [place] [address]  [address] [place], [place] [address]  [address] [place], [place] [address]  [facility]    Offers psychiatric assessment, 8-week Intensive Outpatient Program (several hours a day, several times a week, daytime or evenings), early recovery group, family Program, medication management  Private pay or private insurance only [phone], or  [phone] [address] [place], [place] [address]  [place] [employer] individual, couples and family counseling  Accepts Medicaid, private insurance, and sliding scale for uninsured [phone] [address] [place], [place] [address]  Len Blalock, MD  Individual  counseling  Private insurance [phone] [address] [place], [place] [address]  [facility]   Offers assessment, substance abuse treatment, and behavioral health treatment [phone] [address] [place], [place] [address]  [facility]  Individual counseling  Accepts private insurance [phone] [address] [place], [place] [address]  [facility]  Individual counseling  Delene Loll, private insurance [phone] [address] [place], [place] [address]  [facility]    Offers intensive outpatient program (several hours a day, several times a week)  Private pay, private insurance [phone] [facility] [place], [place]  [facility]  Individual counseling  Medicare, private insurance [phone] [address] [place], [place] [address]  [facility]    Offers intensive outpatient program (several hours a day, several times a week) and partial hospitalization program [phone] [address] [place], [place] [address]  Emerson Monte, MD  Individual counseling [phone] [address] [place], [place] [address]  [facility]  Offers Christian counseling to individuals, couples, and families  Accepts Medicare and private insurance; offers sliding scale for uninsured [phone] [address] [place], [place] [address]  [facility]  [place] counseling [phone] [address] [place], [place] [address]  [facility] crisis counseling, individual counseling, group therapy, in-home therapy, domestic violence services, day treatment, DWI services, Administrator, arts (CST), Assertive Community Treatment Team (ACTT), substance abuse Intensive Outpatient Program (several hours a day, several times a week)  2  locations - [place] and [place] [phone] [address] [place], [place] [address]  [phone] [address] [place], [place] [address]  [facility]     Individual counseling and group therapy  Accepts private insurance, [place], [place] [phone] [address] [place], [place]  [facility]  Offers individual and family counseling  Offers LGBTQ services  Accepts private insurance and private pay [phone] [address] [place], [place] [address]  [facility]    Offers individual counseling, group therapy, and outpatient detox  Accepts private insurance [phone] [address] [place], [place]  [facility]  Individual counseling  Accepts Medicare, private insurance [phone] [address] [place], [place] [address]  [employer]  Individual counseling  Accepts Medicare, private insurance [phone] [address] [place], [place] [address]  [facility] Buttner [facility]   Offers substance abuse Intensive Outpatient Program (several hours a day, several times a week) [phone], or [phone] [place], [place]    State [employer] Corporation Guide Inpatient Behavioral Health/Residential  Substance Abuse Treatment Adults The United Ways 211 is a great source of information about community services available.  Access by dialing 2-1-1 from anywhere in [place], or by website -  PooledIncome.pl.   (Updated 04/2015)  Crisis Assistance 24 hours a day   Services Offered    Area [employer]  24-hour crisis assistance: [phone] [place], [place]   [facility]  24-hour crisis assistance:219-020-8287 [place], [place]  [place]   24-hour crisis assistance: (602)[phone] [place], [place]   [facility] Access to Care Line  24-hour crisis assistance; 5063462167 All    [facility]  24-hour crisis response line: 817-653-2153 All   Other Local Resources (Updated 04/2015)  Inpatient Behavioral Health/Residential Substance Abuse Treatment Programs   Services      Address and Phone Number  [facility] ([facility])   14-day residential rehabilitation  [phone] [place], [place]  [facility] ([facility])    Detox - private pay only  14-day residential rehabilitation -  Medicaid, insurance, private pay only [phone])[phone], or [phone] [address] [place], [place] [address]   [facility] [employer] only  Multiple facilities [phone] admissions   [facility] ([facility])   90-day program  Must be homeless to participate  4174275413, or [phone] Marcy Panning, [facility]  [facility] only [phone], or  [phone] [address] [place], [place] [address]  [facility] Residential Treatment Services     Must make an appointment  Transportation is offered from [place] [address].  Accepts private pay, Sheryn Bison [facility] [phone]  [address] [place], [place] [address]   [employer]  Females only  Associated with [facility] [phone] [address] [place], [place] [address]  [facility] only [phone], or [phone] [address] [place], [place]  [facility]    Detox  Residential rehabilitation  Private insurance only  Multiple locations [phone] admissions  [facility]    Private pay  Private insurance [phone] [address] [place], [place] [address]  [facility]    Males only  Fee required at time of admission [phone] [address] [place], [place] [address]  [facility]    Private pay only  [phone] [phone] [address] [place], [place]  RTS (Residential Treatment Services)    Detox - private pay, Medicaid  Residential rehabilitation for males  - Medicare, Medicaid, insurance, private pay [phone] [address] [place], [place]   [employer]K    Walk-in interviews Monday - Saturday from 8 am - 4 pm  Individuals with legal charges are not eligible [phone] [address] [place], [place] [address]  [facility]   Must be willing to work  Must attend Alcoholics Anonymous meetings [phone] [address] [place], [place]   [facility] [employer]    Faith-based program  Private pay only [phone] [address] [place], [place]

## 2015-12-16 ENCOUNTER — Encounter (HOSPITAL_COMMUNITY): Payer: Self-pay

## 2015-12-16 ENCOUNTER — Inpatient Hospital Stay (HOSPITAL_COMMUNITY): Payer: Self-pay

## 2015-12-16 ENCOUNTER — Emergency Department (HOSPITAL_COMMUNITY): Payer: Self-pay

## 2015-12-16 ENCOUNTER — Inpatient Hospital Stay (HOSPITAL_COMMUNITY)
Admission: EM | Admit: 2015-12-16 | Discharge: 2015-12-19 | DRG: 177 | Disposition: A | Discharge status: Home / Self Care | Payer: Self-pay | Attending: Internal Medicine | Admitting: Internal Medicine

## 2015-12-16 DIAGNOSIS — J189 Pneumonia, unspecified organism: Secondary | ICD-10-CM | POA: Diagnosis present

## 2015-12-16 DIAGNOSIS — F329 Major depressive disorder, single episode, unspecified: Secondary | ICD-10-CM

## 2015-12-16 DIAGNOSIS — J181 Lobar pneumonia, unspecified organism: Secondary | ICD-10-CM

## 2015-12-16 DIAGNOSIS — F319 Bipolar disorder, unspecified: Secondary | ICD-10-CM | POA: Diagnosis present

## 2015-12-16 DIAGNOSIS — R1011 Right upper quadrant pain: Secondary | ICD-10-CM | POA: Diagnosis present

## 2015-12-16 DIAGNOSIS — F1910 Other psychoactive substance abuse, uncomplicated: Secondary | ICD-10-CM

## 2015-12-16 DIAGNOSIS — R0600 Dyspnea, unspecified: Secondary | ICD-10-CM

## 2015-12-16 DIAGNOSIS — G43809 Other migraine, not intractable, without status migrainosus: Secondary | ICD-10-CM

## 2015-12-16 DIAGNOSIS — Z79899 Other long term (current) drug therapy: Secondary | ICD-10-CM

## 2015-12-16 DIAGNOSIS — F17200 Nicotine dependence, unspecified, uncomplicated: Secondary | ICD-10-CM | POA: Diagnosis present

## 2015-12-16 DIAGNOSIS — T405X1A Poisoning by cocaine, accidental (unintentional), initial encounter: Secondary | ICD-10-CM | POA: Diagnosis present

## 2015-12-16 DIAGNOSIS — K5900 Constipation, unspecified: Secondary | ICD-10-CM | POA: Diagnosis present

## 2015-12-16 DIAGNOSIS — J690 Pneumonitis due to inhalation of food and vomit: Principal | ICD-10-CM | POA: Diagnosis present

## 2015-12-16 DIAGNOSIS — G92 Toxic encephalopathy: Secondary | ICD-10-CM | POA: Diagnosis present

## 2015-12-16 DIAGNOSIS — F1414 Cocaine abuse with cocaine-induced mood disorder: Secondary | ICD-10-CM

## 2015-12-16 DIAGNOSIS — G9340 Encephalopathy, unspecified: Secondary | ICD-10-CM | POA: Diagnosis present

## 2015-12-16 LAB — COMPREHENSIVE METABOLIC PANEL
ALT: 10 U/L — AB (ref 14–54)
AST: 13 U/L — ABNORMAL LOW (ref 15–41)
Albumin: 3.2 g/dL — ABNORMAL LOW (ref 3.5–5.0)
Alkaline Phosphatase: 49 U/L (ref 38–126)
Anion gap: 5 (ref 5–15)
BUN: 10 mg/dL (ref 6–20)
CO2: 25 mmol/L (ref 22–32)
CREATININE: 0.72 mg/dL (ref 0.44–1.00)
Calcium: 8.4 mg/dL — ABNORMAL LOW (ref 8.9–10.3)
Chloride: 105 mmol/L (ref 101–111)
Glucose, Bld: 110 mg/dL — ABNORMAL HIGH (ref 65–99)
POTASSIUM: 3.6 mmol/L (ref 3.5–5.1)
Sodium: 135 mmol/L (ref 135–145)
TOTAL PROTEIN: 7 g/dL (ref 6.5–8.1)
Total Bilirubin: 0.6 mg/dL (ref 0.3–1.2)

## 2015-12-16 LAB — CBC
HCT: 32.6 % — ABNORMAL LOW (ref 36.0–46.0)
Hemoglobin: 10.9 g/dL — ABNORMAL LOW (ref 12.0–15.0)
MCH: 29.3 pg (ref 26.0–34.0)
MCHC: 33.4 g/dL (ref 30.0–36.0)
MCV: 87.6 fL (ref 78.0–100.0)
PLATELETS: 321 10*3/uL (ref 150–400)
RBC: 3.72 MIL/uL — ABNORMAL LOW (ref 3.87–5.11)
RDW: 13.5 % (ref 11.5–15.5)
WBC: 10.1 10*3/uL (ref 4.0–10.5)

## 2015-12-16 LAB — URINALYSIS, ROUTINE W REFLEX MICROSCOPIC
Bilirubin Urine: NEGATIVE
Glucose, UA: NEGATIVE mg/dL
Hgb urine dipstick: NEGATIVE
KETONES UR: NEGATIVE mg/dL
LEUKOCYTES UA: NEGATIVE
NITRITE: NEGATIVE
PROTEIN: NEGATIVE mg/dL
Specific Gravity, Urine: 1.006 (ref 1.005–1.030)
pH: 7 (ref 5.0–8.0)

## 2015-12-16 LAB — LIPASE, BLOOD: LIPASE: 19 U/L (ref 11–51)

## 2015-12-16 LAB — I-STAT BETA HCG BLOOD, ED (MC, WL, AP ONLY)

## 2015-12-16 LAB — GLUCOSE, CAPILLARY: GLUCOSE-CAPILLARY: 98 mg/dL (ref 65–99)

## 2015-12-16 LAB — RAPID URINE DRUG SCREEN, HOSP PERFORMED
AMPHETAMINES: NOT DETECTED
BENZODIAZEPINES: NOT DETECTED
Barbiturates: NOT DETECTED
COCAINE: POSITIVE — AB
OPIATES: NOT DETECTED
Tetrahydrocannabinol: NOT DETECTED

## 2015-12-16 LAB — SALICYLATE LEVEL: Salicylate Lvl: <4 mg/dL (ref 2.8–30.0)

## 2015-12-16 LAB — BLOOD GAS, ARTERIAL
ACID-BASE DEFICIT: 1.6 mmol/L (ref 0.0–2.0)
BICARBONATE: 22.8 mmol/L (ref 20.0–28.0)
DRAWN BY: 246861
O2 Content: 2 L/min
O2 Saturation: 98 %
PCO2 ART: 39.6 mmHg (ref 32.0–48.0)
PH ART: 7.379 (ref 7.350–7.450)
Patient temperature: 98.6
pO2, Arterial: 114 mmHg — ABNORMAL HIGH (ref 83.0–108.0)

## 2015-12-16 LAB — MRSA PCR SCREENING: MRSA BY PCR: NEGATIVE

## 2015-12-16 LAB — ETHANOL: Alcohol, Ethyl (B): <5 mg/dL (ref <5)

## 2015-12-16 LAB — PROCALCITONIN: Procalcitonin: <0.1 ng/mL

## 2015-12-16 LAB — ACETAMINOPHEN LEVEL: Acetaminophen (Tylenol), Serum: <10 ug/mL — ABNORMAL LOW (ref 10–30)

## 2015-12-16 MED ORDER — PAROXETINE HCL 20 MG PO TABS
20.0000 mg | ORAL_TABLET | Freq: Every day | ORAL | Status: DC
  Administered 2015-12-17 – 2015-12-19 (×3): 20 mg via ORAL
  Filled 2015-12-16 (×3): qty 1

## 2015-12-16 MED ORDER — VITAMIN B-1 100 MG PO TABS
100.0000 mg | ORAL_TABLET | Freq: Every day | ORAL | Status: DC
  Administered 2015-12-17 – 2015-12-19 (×3): 100 mg via ORAL
  Filled 2015-12-16 (×3): qty 1

## 2015-12-16 MED ORDER — FLEET ENEMA 7-19 GM/118ML RE ENEM: 1.0000 | ENEMA | Freq: Once | RECTAL | Status: DC | PRN

## 2015-12-16 MED ORDER — ACETAMINOPHEN 650 MG RE SUPP: 650.0000 mg | Freq: Four times a day (QID) | RECTAL | Status: DC | PRN

## 2015-12-16 MED ORDER — IPRATROPIUM-ALBUTEROL 0.5-2.5 (3) MG/3ML IN SOLN
3.0000 mL | RESPIRATORY_TRACT | Status: DC | PRN
  Filled 2015-12-16: qty 3

## 2015-12-16 MED ORDER — SUMATRIPTAN SUCCINATE 25 MG PO TABS
25.0000 mg | ORAL_TABLET | ORAL | Status: DC | PRN
  Filled 2015-12-16: qty 1

## 2015-12-16 MED ORDER — SODIUM CHLORIDE 0.9 % IV BOLUS (SEPSIS)
1000.0000 mL | Freq: Once | INTRAVENOUS | Status: AC
  Administered 2015-12-16: 1000 mL via INTRAVENOUS

## 2015-12-16 MED ORDER — ONDANSETRON HCL 4 MG PO TABS: 4.0000 mg | ORAL_TABLET | Freq: Four times a day (QID) | ORAL | Status: DC | PRN

## 2015-12-16 MED ORDER — NALOXONE HCL 2 MG/2ML IJ SOSY
2.0000 mg | PREFILLED_SYRINGE | Freq: Once | INTRAMUSCULAR | Status: AC
Start: 2015-12-16 — End: 2015-12-16
  Administered 2015-12-16: 2 mg via INTRAVENOUS
  Filled 2015-12-16: qty 2

## 2015-12-16 MED ORDER — ADULT MULTIVITAMIN W/MINERALS CH
1.0000 | ORAL_TABLET | Freq: Every day | ORAL | Status: DC
  Administered 2015-12-17 – 2015-12-19 (×3): 1 via ORAL
  Filled 2015-12-16 (×3): qty 1

## 2015-12-16 MED ORDER — KETOROLAC TROMETHAMINE 30 MG/ML IJ SOLN
30.0000 mg | Freq: Once | INTRAMUSCULAR | Status: AC
  Administered 2015-12-16: 30 mg via INTRAVENOUS
  Filled 2015-12-16: qty 1

## 2015-12-16 MED ORDER — DEXTROSE 5 % IV SOLN
500.0000 mg | INTRAVENOUS | Status: DC
  Administered 2015-12-17 – 2015-12-18 (×2): 500 mg via INTRAVENOUS
  Filled 2015-12-16 (×2): qty 500

## 2015-12-16 MED ORDER — LEVOFLOXACIN IN D5W 500 MG/100ML IV SOLN
500.0000 mg | Freq: Once | INTRAVENOUS | Status: DC
  Administered 2015-12-16: 500 mg via INTRAVENOUS
  Filled 2015-12-16: qty 100

## 2015-12-16 MED ORDER — ONDANSETRON HCL 4 MG/2ML IJ SOLN: 4.0000 mg | Freq: Four times a day (QID) | INTRAMUSCULAR | Status: DC | PRN

## 2015-12-16 MED ORDER — KETOROLAC TROMETHAMINE 30 MG/ML IJ SOLN
30.0000 mg | Freq: Four times a day (QID) | INTRAMUSCULAR | Status: AC | PRN
  Administered 2015-12-16 – 2015-12-17 (×3): 30 mg via INTRAVENOUS
  Filled 2015-12-16 (×3): qty 1

## 2015-12-16 MED ORDER — BENZONATATE 100 MG PO CAPS
200.0000 mg | ORAL_CAPSULE | Freq: Three times a day (TID) | ORAL | Status: DC | PRN
  Administered 2015-12-16: 200 mg via ORAL
  Filled 2015-12-16: qty 2

## 2015-12-16 MED ORDER — IPRATROPIUM-ALBUTEROL 0.5-2.5 (3) MG/3ML IN SOLN
RESPIRATORY_TRACT | Status: AC
  Filled 2015-12-16: qty 3

## 2015-12-16 MED ORDER — ACETAMINOPHEN 325 MG PO TABS
650.0000 mg | ORAL_TABLET | Freq: Four times a day (QID) | ORAL | Status: DC | PRN
  Administered 2015-12-17 – 2015-12-18 (×2): 650 mg via ORAL
  Filled 2015-12-16 (×2): qty 2

## 2015-12-16 MED ORDER — NALOXONE HCL 0.4 MG/ML IJ SOLN
0.4000 mg | Freq: Once | INTRAMUSCULAR | Status: AC
  Administered 2015-12-16: 0.4 mg via INTRAVENOUS
  Filled 2015-12-16: qty 1

## 2015-12-16 MED ORDER — AMMONIA AROMATIC IN INHA
RESPIRATORY_TRACT | Status: AC
  Filled 2015-12-16: qty 10

## 2015-12-16 MED ORDER — ONDANSETRON HCL 4 MG/2ML IJ SOLN
4.0000 mg | Freq: Once | INTRAMUSCULAR | Status: AC
  Administered 2015-12-16: 4 mg via INTRAVENOUS
  Filled 2015-12-16: qty 2

## 2015-12-16 MED ORDER — THIAMINE HCL 100 MG/ML IJ SOLN
Freq: Once | INTRAVENOUS | Status: AC
  Administered 2015-12-16: 12:00:00 via INTRAVENOUS
  Filled 2015-12-16: qty 1000

## 2015-12-16 MED ORDER — FOLIC ACID 1 MG PO TABS
1.0000 mg | ORAL_TABLET | Freq: Every day | ORAL | Status: DC
  Administered 2015-12-17 – 2015-12-19 (×3): 1 mg via ORAL
  Filled 2015-12-16 (×3): qty 1

## 2015-12-16 MED ORDER — ALBUTEROL SULFATE (2.5 MG/3ML) 0.083% IN NEBU
5.0000 mg | INHALATION_SOLUTION | Freq: Once | RESPIRATORY_TRACT | Status: AC
  Administered 2015-12-16: 5 mg via RESPIRATORY_TRACT
  Filled 2015-12-16: qty 6

## 2015-12-16 MED ORDER — DEXTROSE-NACL 5-0.9 % IV SOLN
INTRAVENOUS | Status: DC
Start: 2015-12-16 — End: 2015-12-19
  Administered 2015-12-16 – 2015-12-19 (×4): via INTRAVENOUS

## 2015-12-16 MED ORDER — SODIUM CHLORIDE 0.9% FLUSH
3.0000 mL | Freq: Two times a day (BID) | INTRAVENOUS | Status: DC
  Administered 2015-12-17 – 2015-12-18 (×3): 3 mL via INTRAVENOUS

## 2015-12-16 MED ORDER — IPRATROPIUM-ALBUTEROL 0.5-2.5 (3) MG/3ML IN SOLN
3.0000 mL | Freq: Four times a day (QID) | RESPIRATORY_TRACT | Status: DC
  Administered 2015-12-16: 3 mL via RESPIRATORY_TRACT

## 2015-12-16 MED ORDER — ENOXAPARIN SODIUM 40 MG/0.4ML ~~LOC~~ SOLN
40.0000 mg | Freq: Every day | SUBCUTANEOUS | Status: DC
  Administered 2015-12-16 – 2015-12-19 (×4): 40 mg via SUBCUTANEOUS
  Filled 2015-12-16 (×4): qty 0.4

## 2015-12-16 MED ORDER — TOPIRAMATE 25 MG PO TABS
50.0000 mg | ORAL_TABLET | Freq: Two times a day (BID) | ORAL | Status: DC
  Administered 2015-12-16 – 2015-12-19 (×6): 50 mg via ORAL
  Filled 2015-12-16 (×7): qty 2

## 2015-12-16 MED ORDER — BUSPIRONE HCL 5 MG PO TABS
7.5000 mg | ORAL_TABLET | Freq: Two times a day (BID) | ORAL | Status: DC
  Administered 2015-12-16 – 2015-12-19 (×6): 7.5 mg via ORAL
  Filled 2015-12-16: qty 2
  Filled 2015-12-16: qty 1
  Filled 2015-12-16 (×3): qty 2
  Filled 2015-12-16 (×2): qty 1

## 2015-12-16 MED ORDER — DEXTROSE 5 % IV SOLN
1.0000 g | INTRAVENOUS | Status: DC
  Administered 2015-12-16 – 2015-12-17 (×2): 1 g via INTRAVENOUS
  Filled 2015-12-16 (×3): qty 10

## 2015-12-16 NOTE — ED Triage Notes (Signed)
N/v and abdominal pain for 5 days at home states pain now is severe admits to using drugs per EMS

## 2015-12-16 NOTE — ED Notes (Signed)
No respiratory or acute distress noted awake and talking now Dr Nicanor AlconPalumbo in room to see pt no reaction to medication noted call light in reach.

## 2015-12-16 NOTE — ED Provider Notes (Signed)
[facility]-EMERGENCY DEPT Provider Note   CSN: [id number] Arrival date & time: 12/16/15  0431     History   Chief Complaint Chief Complaint  Patient presents with  . Abdominal Pain    x 5 days n/v     HPI Madison Garza is a 40 y.o. female.   Abdominal Pain   This is a new problem. The current episode started more than 1 week ago. The problem occurs constantly. The problem has not changed since onset.The pain is located in the generalized abdominal region. The pain is severe. Associated symptoms include nausea and vomiting. Pertinent negatives include anorexia, fever, diarrhea, dysuria and hematuria. Nothing aggravates the symptoms. Nothing relieves the symptoms. Past workup does not include GI consult. Her past medical history does not include PUD.  Patient admits to using xanax as EMS arrived but denies any other drug use.  Denies an vaginal bleeding or discharge.    Past Medical History:  Diagnosis Date  . Alcohol abuse   . Bipolar 1 disorder (HCC)   . Depression   . Drug abuse   . WUJWJXBJ(478.2)     Patient Active Problem List   Diagnosis Date Noted  . Subacromial bursitis 04/21/2014  . 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 Acute bronchitis due to other specified organisms 04/15/2014  . Abdominal tenderness 04/15/2014  . Loose stools 04/15/2014  . Routine general medical examination at a health care facility 03/19/2014  . Right hip pain 03/19/2014  . Right shoulder pain 03/19/2014  . Migraine 02/05/2014  . Menometrorrhagia 08/29/2012  . Initiation of Depo Provera 08/29/2012  . LSIL (low grade squamous intraepithelial lesion) on Pap smear 08/29/2012  . Cellulitis of right upper extremity 06/29/2012  . Polysubstance dependence (HCC) 04/24/2012  . Mood disorder (HCC) 04/24/2012  . Anxiety disorder 04/24/2012    Past Surgical History:  Procedure Laterality Date  . BREAST ENHANCEMENT SURGERY      OB History    Gravida Para Term Preterm AB Living   5 5 5     5     SAB TAB Ectopic Multiple Live Births                   Home Medications    Prior to Admission medications   Medication Sig Start Date End Date Taking? Authorizing Provider  albuterol (PROVENTIL HFA;VENTOLIN HFA) 108 (90 BASE) MCG/ACT inhaler Inhale 2 puffs into the lungs every 6 (six) hours as needed for wheezing or shortness of breath. 04/15/14   Kelle Darting, NP  ALPRAZolam Prudy Feeler) 0.5 MG tablet Take 1 tablet (0.5 mg total) by mouth 2 (two) times daily as needed for anxiety. 05/25/14   Veryl Speak, FNP  busPIRone (BUSPAR) 7.5 MG tablet  02/05/14   Historical Provider, MD  guaiFENesin (MUCINEX) 600 MG 12 hr tablet Take 1 tablet (600 mg total) by mouth 2 (two) times daily. 04/15/14   Kelle Darting, NP  HYDROcodone-homatropine (HYCODAN) 5-1.5 MG/5ML syrup Take 5 mLs by mouth at bedtime as needed for cough. 04/15/14   Kelle Darting, NP  meloxicam (MOBIC) 15 MG tablet Take 1 tablet (15 mg total) by mouth daily. 04/21/14   Judi Saa, DO  PARoxetine (PAXIL) 20 MG tablet Take 1 tablet (20 mg total) by mouth daily. 05/25/14   Veryl Speak, FNP  promethazine (PHENERGAN) 25 MG tablet Take 1 tablet (25 mg total) by mouth every 8 (eight) hours as needed for nausea or vomiting. 03/19/14   Tama Headings  Calone, FNP  sulfamethoxazole-trimethoprim (BACTRIM DS,SEPTRA DS) 800-160 MG per tablet Take 1 tablet by mouth 2 (two) times daily. 04/15/14   Kelle Darting, NP  SUMAtriptan (IMITREX) 100 MG tablet TAKE AS DIRECTED. MAY REPEAT IN TWO HOURS IF HEADACHE PERSISTS OR RECURS. DO NOT BECOME PREGNANT WHILE ON THIS MED 05/07/14   Veryl Speak, FNP  SUMAtriptan (IMITREX) 50 MG tablet  02/05/14   Historical Provider, MD  topiramate (TOPAMAX) 50 MG tablet Take 1 tablet (50 mg total) by mouth 2 (two) times daily. 04/07/14   Veryl Speak, FNP    Family History History reviewed. No pertinent family history.  Social History Social History  Substance Use Topics  . Smoking status: Current Every Day  Smoker    Packs/day: 1.00  . Smokeless tobacco: Never Used  . Alcohol use 42.0 oz/week    70 Cans of beer per week     Allergies   Review of patient's allergies indicates no known allergies.   Review of Systems Review of Systems  Constitutional: Negative for appetite change and fever.  Cardiovascular: Negative for chest pain.  Gastrointestinal: Positive for abdominal pain, nausea and vomiting. Negative for anorexia and diarrhea.  Genitourinary: Negative for dysuria, hematuria, vaginal bleeding and vaginal discharge.  All other systems reviewed and are negative.    Physical Exam Updated Vital Signs BP 116/80 (BP Location: Right Arm)   Pulse 96   Temp 99.9 F (37.7 C) (Oral)   Resp 18   Ht 5\' 3"  (1.6 m)   Wt 122 lb (55.3 kg)   SpO2 94%   BMI 21.61 kg/m   Physical Exam  Constitutional: She appears well-developed and well-nourished. No distress.  Extremely somnolent   HENT:  Head: Normocephalic and atraumatic.  Mouth/Throat: No oropharyngeal exudate.  Eyes: Conjunctivae are normal.  Pinpoint pupils  Neck: Normal range of motion. Neck supple.  Cardiovascular: Normal rate, regular rhythm and intact distal pulses.   Pulmonary/Chest: Effort normal and breath sounds normal. No respiratory distress. She has no wheezes. She has no rales.  Abdominal: Soft. Bowel sounds are normal. She exhibits no mass. There is no tenderness. There is no rebound and no guarding.  Musculoskeletal: Normal range of motion.  Neurological: She has normal reflexes.  Skin: Skin is warm and dry. Capillary refill takes less than 2 seconds.  Psychiatric:  Sleeping but arouses to verbal and tactile stimulation     ED Treatments / Results  Labs (all labs ordered are listed, but only abnormal results are displayed) Labs Reviewed  COMPREHENSIVE METABOLIC PANEL - Abnormal; Notable for the following:       Result Value   Glucose, Bld 110 (*)    Calcium 8.4 (*)    Albumin 3.2 (*)    AST 13 (*)     ALT 10 (*)    All other components within normal limits  CBC - Abnormal; Notable for the following:    RBC 3.72 (*)    Hemoglobin 10.9 (*)    HCT 32.6 (*)    All other components within normal limits  LIPASE, BLOOD  URINALYSIS, ROUTINE W REFLEX MICROSCOPIC (NOT AT [facility])  URINE RAPID DRUG SCREEN, HOSP PERFORMED  URINE RAPID DRUG SCREEN, HOSP PERFORMED  ETHANOL  ACETAMINOPHEN LEVEL  SALICYLATE LEVEL   Vitals:   12/16/15 0435 12/16/15 0601  BP: 116/80 115/90  Pulse: 96 82  Resp: 18 18  Temp: 99.9 F (37.7 C) 97.3 F (36.3 C)   Results for orders placed or performed  during the hospital encounter of 12/16/15  Lipase, blood  Result Value Ref Range   Lipase 19 11 - 51 U/L  Comprehensive metabolic panel  Result Value Ref Range   Sodium 135 135 - 145 mmol/L   Potassium 3.6 3.5 - 5.1 mmol/L   Chloride 105 101 - 111 mmol/L   CO2 25 22 - 32 mmol/L   Glucose, Bld 110 (H) 65 - 99 mg/dL   BUN 10 6 - 20 mg/dL   Creatinine, Ser 1.61 0.44 - 1.00 mg/dL   Calcium 8.4 (L) 8.9 - 10.3 mg/dL   Total Protein 7.0 6.5 - 8.1 g/dL   Albumin 3.2 (L) 3.5 - 5.0 g/dL   AST 13 (L) 15 - 41 U/L   ALT 10 (L) 14 - 54 U/L   Alkaline Phosphatase 49 38 - 126 U/L   Total Bilirubin 0.6 0.3 - 1.2 mg/dL   GFR calc non Af Amer >60 >60 mL/min   GFR calc Af Amer >60 >60 mL/min   Anion gap 5 5 - 15  CBC  Result Value Ref Range   WBC 10.1 4.0 - 10.5 K/uL   RBC 3.72 (L) 3.87 - 5.11 MIL/uL   Hemoglobin 10.9 (L) 12.0 - 15.0 g/dL   HCT 09.6 (L) 04.5 - 40.9 %   MCV 87.6 78.0 - 100.0 fL   MCH 29.3 26.0 - 34.0 pg   MCHC 33.4 30.0 - 36.0 g/dL   RDW 81.1 91.4 - 78.2 %   Platelets 321 150 - 400 K/uL  Urinalysis, Routine w reflex microscopic  Result Value Ref Range   Color, Urine YELLOW YELLOW   APPearance CLEAR CLEAR   Specific Gravity, Urine 1.006 1.005 - 1.030   pH 7.0 5.0 - 8.0   Glucose, UA NEGATIVE NEGATIVE mg/dL   Hgb urine dipstick NEGATIVE NEGATIVE   Bilirubin Urine NEGATIVE NEGATIVE   Ketones, ur  NEGATIVE NEGATIVE mg/dL   Protein, ur NEGATIVE NEGATIVE mg/dL   Nitrite NEGATIVE NEGATIVE   Leukocytes, UA NEGATIVE NEGATIVE  Urine rapid drug screen (hosp performed)  Result Value Ref Range   Opiates NONE DETECTED NONE DETECTED   Cocaine POSITIVE (A) NONE DETECTED   Benzodiazepines NONE DETECTED NONE DETECTED   Amphetamines NONE DETECTED NONE DETECTED   Tetrahydrocannabinol NONE DETECTED NONE DETECTED   Barbiturates NONE DETECTED NONE DETECTED   No results found.  Procedures (including critical care time) Results for orders placed or performed during the hospital encounter of 12/16/15  Lipase, blood  Result Value Ref Range   Lipase 19 11 - 51 U/L  Comprehensive metabolic panel  Result Value Ref Range   Sodium 135 135 - 145 mmol/L   Potassium 3.6 3.5 - 5.1 mmol/L   Chloride 105 101 - 111 mmol/L   CO2 25 22 - 32 mmol/L   Glucose, Bld 110 (H) 65 - 99 mg/dL   BUN 10 6 - 20 mg/dL   Creatinine, Ser 9.56 0.44 - 1.00 mg/dL   Calcium 8.4 (L) 8.9 - 10.3 mg/dL   Total Protein 7.0 6.5 - 8.1 g/dL   Albumin 3.2 (L) 3.5 - 5.0 g/dL   AST 13 (L) 15 - 41 U/L   ALT 10 (L) 14 - 54 U/L   Alkaline Phosphatase 49 38 - 126 U/L   Total Bilirubin 0.6 0.3 - 1.2 mg/dL   GFR calc non Af Amer >60 >60 mL/min   GFR calc Af Amer >60 >60 mL/min   Anion gap 5 5 - 15  CBC  Result Value Ref Range   WBC 10.1 4.0 - 10.5 K/uL   RBC 3.72 (L) 3.87 - 5.11 MIL/uL   Hemoglobin 10.9 (L) 12.0 - 15.0 g/dL   HCT 16.1 (L) 09.6 - 04.5 %   MCV 87.6 78.0 - 100.0 fL   MCH 29.3 26.0 - 34.0 pg   MCHC 33.4 30.0 - 36.0 g/dL   RDW 40.9 81.1 - 91.4 %   Platelets 321 150 - 400 K/uL  Urinalysis, Routine w reflex microscopic  Result Value Ref Range   Color, Urine YELLOW YELLOW   APPearance CLEAR CLEAR   Specific Gravity, Urine 1.006 1.005 - 1.030   pH 7.0 5.0 - 8.0   Glucose, UA NEGATIVE NEGATIVE mg/dL   Hgb urine dipstick NEGATIVE NEGATIVE   Bilirubin Urine NEGATIVE NEGATIVE   Ketones, ur NEGATIVE NEGATIVE mg/dL    Protein, ur NEGATIVE NEGATIVE mg/dL   Nitrite NEGATIVE NEGATIVE   Leukocytes, UA NEGATIVE NEGATIVE  Urine rapid drug screen (hosp performed)  Result Value Ref Range   Opiates NONE DETECTED NONE DETECTED   Cocaine POSITIVE (A) NONE DETECTED   Benzodiazepines NONE DETECTED NONE DETECTED   Amphetamines NONE DETECTED NONE DETECTED   Tetrahydrocannabinol NONE DETECTED NONE DETECTED   Barbiturates NONE DETECTED NONE DETECTED  Ethanol  Result Value Ref Range   Alcohol, Ethyl (B) <5 <5 mg/dL  Acetaminophen level  Result Value Ref Range   Acetaminophen (Tylenol), Serum <10 (L) 10 - 30 ug/mL  Salicylate level  Result Value Ref Range   Salicylate Lvl <4.0 2.8 - 30.0 mg/dL  I-Stat Beta hCG blood, ED ([facility], [facility], [facility] only)  Result Value Ref Range   I-stat hCG, quantitative <5.0 <5 mIU/mL   Comment 3           Dg Abd Acute W/chest  Result Date: 12/16/2015 CLINICAL DATA:  Abdominal pain with nausea and vomiting EXAM: DG ABDOMEN ACUTE W/ 1V CHEST COMPARISON:  CT pelvis 10/23/2014 FINDINGS: Heart size and vascularity normal. Mild airspace disease right medial lung base may represent pneumonia or atelectasis. Left lung clear. No effusion Nonobstructive bowel gas pattern. No air-fluid levels. Moderate stool in the colon. No free air. No renal calculi. No acute skeletal abnormality. IMPRESSION: Mild airspace disease right medial lung base, may represent atelectasis or pneumonia Constipation without bowel  obstruction. Electronically Signed   By: Marlan Palau M.D.   On: 12/16/2015 07:20    Medications Ordered in ED Medications  ammonia inhalant (not administered)  levofloxacin (LEVAQUIN) IVPB 500 mg (not administered)  sodium chloride 0.9 % bolus 1,000 mL (not administered)  albuterol (PROVENTIL) (2.5 MG/3ML) 0.083% nebulizer solution 5 mg (not administered)  naloxone (NARCAN) injection 0.4 mg (0.4 mg Intravenous Given 12/16/15 0557)  ondansetron (ZOFRAN) injection 4 mg (4 mg Intravenous Given 12/16/15  0557)  sodium chloride 0.9 % bolus 1,000 mL (0 mLs Intravenous Stopped 12/16/15 0637)  ketorolac (TORADOL) 30 MG/ML injection 30 mg (30 mg Intravenous Given 12/16/15 0617)  naloxone [facility]) injection 2 mg (2 mg Intravenous Given 12/16/15 0715)  naloxone [facility]) injection 2 mg (2 mg Intravenous Given 12/16/15 0738)     Initial Impression / Assessment and Plan / ED Course  I have reviewed the triage vital signs and the nursing notes.  Pertinent labs & imaging results that were available during my care of the patient were reviewed by me and considered in my medical decision making (see chart for details).  Clinical Course    Aroused and alert post narcan.  I suspect  she is using carfentanil.  My concern is that she micro-aspirated while sedated secondary to substance abuse.    Will admit for IV abx and observation.   .    Final Clinical Impressions(s) / ED Diagnoses   Final diagnoses:  None    New Prescriptions New Prescriptions   No medications on file     Canisha Issac, MD 12/16/15 [phone]

## 2015-12-16 NOTE — ED Notes (Signed)
Hospitalist and EDP, Palumbo at bedside.

## 2015-12-16 NOTE — ED Notes (Signed)
Respiratory called for treatment.

## 2015-12-16 NOTE — ED Notes (Signed)
Respiratory at bedside.

## 2015-12-16 NOTE — ED Notes (Signed)
Bed: [id number] Expected date:  Expected time:  Means of arrival:  Comments: EMS 39yo F abd pain

## 2015-12-16 NOTE — H&P (Signed)
History and Physical    Madison ShorterDanae A Mccard UJW:[id number] DOB: Apr 02, 1976 DOA: 12/16/2015  PCP: Jeanine Luzalone, Gregory, FNP   Patient coming from: Home  Chief Complaint: Altered mental status.   HPI: Madison Garza is a 40 y.o. female with medical history significant of substance abuse, who presents to hospital with acute change in mental status. The history is limited due to patient's lethargy and somnolence, most information obtained from emergency physician. Apparently patient called EMS this morning, she was found psychotic, confused, posteriorly she developed worsening mentation, by time she reached the emergency department she was stuporous and lethargic. She required multiple doses of Narcan, she was able to respond to simple questions to the emergency physician. Apparently she had overdosed in the past, had psychiatric admissions in the past. On this occasion there is no evidence of a suicidal attempt per discussion with emergency room physician.   On my exam patient is to produce, not following commands, no answering questions.   ED Course: IV fluids, Narcan  Review of Systems: Unable to obtain due to patient's change in mental status.  Past Medical History:  Diagnosis Date  . Alcohol abuse   . Bipolar 1 disorder (HCC)   . Depression   . Drug abuse   . FAOZHYQM(578.4Headache(784.0)     Past Surgical History:  Procedure Laterality Date  . BREAST ENHANCEMENT SURGERY       reports that she has been smoking.  She has been smoking about 1.00 pack per day. She has never used smokeless tobacco. She reports that she drinks about 42.0 oz of alcohol per week . She reports that she uses drugs, including Benzodiazepines, Cocaine, Marijuana, and Hydrocodone.  No Known Allergies  History reviewed. No pertinent family history., History limited due to patient's lethargy.  Prior to Admission medications   Medication Sig Start Date End Date Taking? Authorizing Provider  albuterol (PROVENTIL HFA;VENTOLIN HFA)  108 (90 BASE) MCG/ACT inhaler Inhale 2 puffs into the lungs every 6 (six) hours as needed for wheezing or shortness of breath. 04/15/14   Kelle DartingLayne C Weaver, NP  ALPRAZolam Prudy Feeler(XANAX) 0.5 MG tablet Take 1 tablet (0.5 mg total) by mouth 2 (two) times daily as needed for anxiety. 05/25/14   Veryl SpeakGregory D Calone, FNP  busPIRone (BUSPAR) 7.5 MG tablet  02/05/14   Historical Provider, MD  guaiFENesin (MUCINEX) 600 MG 12 hr tablet Take 1 tablet (600 mg total) by mouth 2 (two) times daily. 04/15/14   Kelle DartingLayne C Weaver, NP  HYDROcodone-homatropine (HYCODAN) 5-1.5 MG/5ML syrup Take 5 mLs by mouth at bedtime as needed for cough. 04/15/14   Kelle DartingLayne C Weaver, NP  meloxicam (MOBIC) 15 MG tablet Take 1 tablet (15 mg total) by mouth daily. 04/21/14   Judi SaaZachary M Smith, DO  PARoxetine (PAXIL) 20 MG tablet Take 1 tablet (20 mg total) by mouth daily. 05/25/14   Veryl SpeakGregory D Calone, FNP  promethazine (PHENERGAN) 25 MG tablet Take 1 tablet (25 mg total) by mouth every 8 (eight) hours as needed for nausea or vomiting. 03/19/14   Veryl SpeakGregory D Calone, FNP  sulfamethoxazole-trimethoprim (BACTRIM DS,SEPTRA DS) 800-160 MG per tablet Take 1 tablet by mouth 2 (two) times daily. 04/15/14   Kelle DartingLayne C Weaver, NP  SUMAtriptan (IMITREX) 100 MG tablet TAKE AS DIRECTED. MAY REPEAT IN TWO HOURS IF HEADACHE PERSISTS OR RECURS. DO NOT BECOME PREGNANT WHILE ON THIS MED 05/07/14   Veryl SpeakGregory D Calone, FNP  SUMAtriptan (IMITREX) 50 MG tablet  02/05/14   Historical Provider, MD  topiramate (TOPAMAX) 50  MG tablet Take 1 tablet (50 mg total) by mouth 2 (two) times daily. 04/07/14   Veryl Speak, FNP    Physical Exam: Vitals:   12/16/15 0745 12/16/15 0800 12/16/15 0814 12/16/15 0815  BP: 113/71 121/74 120/87 120/87  Pulse: 84 87 86 90  Resp: 25 23 20 25   Temp:      TempSrc:      SpO2: 99% 95% 94% 95%  Weight:      Height:          Constitutional: Deconditioned and ill-looking appearing. Vitals:   12/16/15 0745 12/16/15 0800 12/16/15 0814 12/16/15 0815  BP:  113/71 121/74 120/87 120/87  Pulse: 84 87 86 90  Resp: 25 23 20 25   Temp:      TempSrc:      SpO2: 99% 95% 94% 95%  Weight:      Height:       Eyes: PERRL, lids and conjunctivae is pale no icterus ENMT: Mucous membranes are dry. Posterior pharynx clear of any exudate or lesions.Normal dentition.  Neck: normal, supple, no masses, no thyromegaly Respiratory:  No wheezing, no crackles.  No accessory muscle use. Decrease breath sounds due to poor inspiratory effort, positive rhonchi bilaterally predominantly from the proximal airways. Cardiovascular: Regular rate and rhythm, tachycardic, no murmurs / rubs / gallops. No extremity edema. 2+ pedal pulses. No carotid bruits.  Abdomen:  Mild tenderness to deep palpation, no masses palpated. No hepatosplenomegaly. Bowel sounds decreased. Positive abdominal distention. Musculoskeletal: no clubbing / cyanosis. No joint deformity upper and lower extremities. Good ROM, no contractures. Normal muscle tone.  Skin: no rashes, lesions, ulcers. No induration Neurologic: Patient is stuporous and lethargic, she localizes  pain, her pupils are reactive to light, equal in size, she does have a gag reflex. Does not follow commands or response to questions.  Labs on Admission: I have personally reviewed following labs and imaging studies  CBC:  Recent Labs Lab 12/16/15 0448  WBC 10.1  HGB 10.9*  HCT 32.6*  MCV 87.6  PLT 321   Basic Metabolic Panel:  Recent Labs Lab 12/16/15 0448  NA 135  K 3.6  CL 105  CO2 25  GLUCOSE 110*  BUN 10  CREATININE 0.72  CALCIUM 8.4*   GFR: Estimated Creatinine Clearance: 77.3 mL/min (by C-G formula based on SCr of 0.8 mg/dL). Liver Function Tests:  Recent Labs Lab 12/16/15 0448  AST 13*  ALT 10*  ALKPHOS 49  BILITOT 0.6  PROT 7.0  ALBUMIN 3.2*    Recent Labs Lab 12/16/15 0448  LIPASE 19   No results for input(s): AMMONIA in the last 168 hours. Coagulation Profile: No results for input(s): INR,  PROTIME in the last 168 hours. Cardiac Enzymes: No results for input(s): CKTOTAL, CKMB, CKMBINDEX, TROPONINI in the last 168 hours. BNP (last 3 results) No results for input(s): PROBNP in the last 8760 hours. HbA1C: No results for input(s): HGBA1C in the last 72 hours. CBG: No results for input(s): GLUCAP in the last 168 hours. Lipid Profile: No results for input(s): CHOL, HDL, LDLCALC, TRIG, CHOLHDL, LDLDIRECT in the last 72 hours. Thyroid Function Tests: No results for input(s): TSH, T4TOTAL, FREET4, T3FREE, THYROIDAB in the last 72 hours. Anemia Panel: No results for input(s): VITAMINB12, FOLATE, FERRITIN, TIBC, IRON, RETICCTPCT in the last 72 hours. Urine analysis:    Component Value Date/Time   COLORURINE YELLOW 12/16/2015 0540   APPEARANCEUR CLEAR 12/16/2015 0540   LABSPEC 1.006 12/16/2015 0540   PHURINE 7.0 12/16/2015  0540   GLUCOSEU NEGATIVE 12/16/2015 0540   HGBUR NEGATIVE 12/16/2015 0540   BILIRUBINUR NEGATIVE 12/16/2015 0540   BILIRUBINUR Small 04/15/2014 1147   KETONESUR NEGATIVE 12/16/2015 0540   PROTEINUR NEGATIVE 12/16/2015 0540   UROBILINOGEN 1.0 04/15/2014 1147   UROBILINOGEN 0.2 07/30/2012 1120   NITRITE NEGATIVE 12/16/2015 0540   LEUKOCYTESUR NEGATIVE 12/16/2015 0540   Sepsis Labs: !!!!!!!!!!!!!!!!!!!!!!!!!!!!!!!!!!!!!!!!!!!! @LABRCNTIP (procalcitonin:4,lacticidven:4) )No results found for this or any previous visit (from the past 240 hour(s)).   Radiological Exams on Admission: Dg Abd Acute W/chest  Result Date: 12/16/2015 CLINICAL DATA:  Abdominal pain with nausea and vomiting EXAM: DG ABDOMEN ACUTE W/ 1V CHEST COMPARISON:  CT pelvis 10/23/2014 FINDINGS: Heart size and vascularity normal. Mild airspace disease right medial lung base may represent pneumonia or atelectasis. Left lung clear. No effusion Nonobstructive bowel gas pattern. No air-fluid levels. Moderate stool in the colon. No free air. No renal calculi. No acute skeletal abnormality. IMPRESSION:  Mild airspace disease right medial lung base, may represent atelectasis or pneumonia Constipation without bowel  obstruction. Electronically Signed   By: Marlan Palau M.D.   On: 12/16/2015 07:20    EKG: Independently reviewed.   Chest and abdominal films. Personally reviewed. Chest film is hypoinflated, and good penetration, there is a faint small opacity at the right base, no other effusions or infiltrates noted. Abdominal films no signs of obstruction, positive stools.  Assessment/Plan Active Problems:   PNA (pneumonia)   Encephalopathy acute  This is a 40 year old female who has been brought to the hospital with acute change in mental status. She required Narcan in the emergency department. She is unable to give any history. On initial physical examination she is stuporous, she is afebrile and hemodynamically stable, her oxygen saturation is 95% on 2 L cannula and her respiratory rate is 20-23. Her serum sodium is 135, potassium 3.6, creatinine 0.72, BUN 10, glucose 110, her white cell count is 10.1. Hemoglobin is 10.9 hematocrit 32.6. Her urinalysis negative for urinary tract infection. Her chest film has a faint infiltrate on the right base, her urine drug screen is positive for cocaine, alcohol level is less than 5.   Working diagnosis. Metabolic/toxic encephalopathy due to cocaine cannot rule out other substances, complicated by suspected aspiration pneumonia to rule out atelectasis.  1. Metabolic encephalopathy. Patient will be admitted to the progressive care unit for close neurologic monitoring. Neuro checks every 2 hours. Supportive IV fluids with dextrose. Will order 1 L of saline with multivitamins, folic acid and thiamine. For completion will order CT of her head. Currently patient is able to protect her airway. pH7.37 with a PCO2 39.6  2. Aspiration pneumonia. Patient will be placed on antibiotic therapy with ceftriaxone and azithromycin intravenously. Aspiration precautions,  oxygen per nasal cannula to target O2 sat duration motor 92%. Oximetry monitor. We'll follow up on chest film in the morning, and pro- calcitonin, if these diagnostics are negative, antibiotics might be able to be de-escalated. Will order blood cultures, and follow cell count.   3. Bipolar and depression. We'll continue buspirone, paroxetine and Topamax.  4. Constipation. Will use Fleet enemas as needed. Clinical signs of bowel obstruction or obstipation.   DVT prophylaxis: Lovenox Code Status: Full code Family Communication: There is no family at bedside Disposition Plan: Home Consults called: Non- Admission status: Inpatient   Trew Sunde Annett Gula MD [facility] Pager [phone]  If 7PM-7AM, please contact night-coverage [email or web address] Password [facility]  12/16/2015, 8:28 AM

## 2015-12-16 NOTE — ED Notes (Signed)
No respiratory or acute distress noted resting in bed with eyes closed no reaction to medication noted call light in reach. 

## 2015-12-17 ENCOUNTER — Inpatient Hospital Stay (HOSPITAL_COMMUNITY): Payer: Self-pay

## 2015-12-17 DIAGNOSIS — G9340 Encephalopathy, unspecified: Secondary | ICD-10-CM

## 2015-12-17 LAB — COMPREHENSIVE METABOLIC PANEL
ALK PHOS: 45 U/L (ref 38–126)
ALT: 13 U/L — AB (ref 14–54)
AST: 13 U/L — AB (ref 15–41)
Albumin: 2.7 g/dL — ABNORMAL LOW (ref 3.5–5.0)
Anion gap: 3 — ABNORMAL LOW (ref 5–15)
BILIRUBIN TOTAL: 0.3 mg/dL (ref 0.3–1.2)
BUN: 9 mg/dL (ref 6–20)
CALCIUM: 8.4 mg/dL — AB (ref 8.9–10.3)
CO2: 24 mmol/L (ref 22–32)
CREATININE: 0.54 mg/dL (ref 0.44–1.00)
Chloride: 114 mmol/L — ABNORMAL HIGH (ref 101–111)
Glucose, Bld: 119 mg/dL — ABNORMAL HIGH (ref 65–99)
Potassium: 3.9 mmol/L (ref 3.5–5.1)
Sodium: 141 mmol/L (ref 135–145)
Total Protein: 6.6 g/dL (ref 6.5–8.1)

## 2015-12-17 LAB — CBC
HCT: 33.2 % — ABNORMAL LOW (ref 36.0–46.0)
Hemoglobin: 10.7 g/dL — ABNORMAL LOW (ref 12.0–15.0)
MCH: 29.2 pg (ref 26.0–34.0)
MCHC: 32.2 g/dL (ref 30.0–36.0)
MCV: 90.7 fL (ref 78.0–100.0)
PLATELETS: 289 10*3/uL (ref 150–400)
RBC: 3.66 MIL/uL — AB (ref 3.87–5.11)
RDW: 13.7 % (ref 11.5–15.5)
WBC: 7.4 10*3/uL (ref 4.0–10.5)

## 2015-12-17 MED ORDER — HYDROCODONE-ACETAMINOPHEN 5-325 MG PO TABS
1.0000 | ORAL_TABLET | Freq: Four times a day (QID) | ORAL | Status: DC | PRN
Start: 2015-12-17 — End: 2015-12-18
  Administered 2015-12-17 – 2015-12-18 (×4): 1 via ORAL
  Filled 2015-12-17 (×4): qty 1

## 2015-12-17 NOTE — Progress Notes (Signed)
Received pt from ICU.  [place]  I agree with previous RN's assessment and will continue with current plan of care.  BP 130/87 (BP Location: Right Arm)   Pulse (!) 55   Temp 98.4 F (36.9 C) (Oral)   Resp 18   Ht 5\' 3"  (1.6 m)   Wt 55.3 kg (122 lb)   LMP  (LMP Unknown) Comment: neg pregnancy test  SpO2 99%   BMI 21.61 kg/m

## 2015-12-17 NOTE — Progress Notes (Signed)
Patient ID: Madison Garza, female   DOB: July 29, 1975, 40 y.o.   MRN: [id number]   PROGRESS NOTE    KRISTELLE CAVALLARO  WUJ:[id number] DOB: [birth year] DOA: 12/16/2015  PCP: Pcp Not In System   Brief Narrative:  40 y.o. female with known substance abuse, who presented to hospital with acute change in mental status. Apparently patient called EMS this morning, she was found psychotic, confused. She required multiple doses of Narcan, she was able to respond to simple questions to the emergency physician. Apparently she had overdosed in the past, had psychiatric admissions in the past. On this occasion there is no evidence of a suicidal attempt per discussion with emergency room physician.   Assessment & Plan:   1. Metabolic encephalopathy. Suspect related to cocaine OD, in the past she has also had amphetamines and THC, barbiturates in urine but not this AM. Her mental status is clear and please note that pt asked mom to leave room to discuss details of current medical condition. She admits to substance abuse and is aware she needs extensive social support to stop her habits. She is stable for transfer to tele unit.   2. Aspiration pneumonia. Patient placed on antibiotic therapy with ceftriaxone and azithromycin intravenously. Will continue same regimen for now and reassess clinical status in AM.   3. Bipolar and depression. Continue buspirone, paroxetine and Topamax.  4. Constipation. Will use Fleet enemas as needed. Clinical signs of bowel obstruction or obstipation.  5. RUQ pain - obtain RUQ US  DVT prophylaxis: Lovenox Sq Code Status: Full  Family Communication: Patient at bedside, mother at bedside but pt asked at one point for mother to leave the room  Disposition Plan: Home in few days   Consultants:   None  Procedures:   None  Antimicrobials:   Zithromax 8/31 -->   Rocephin 8/31 -->    Subjective: No events this AM. Pt concerned with RUQ area pain,3/10 in severity.    Objective: Vitals:   12/17/15 0400 12/17/15 0800 12/17/15 1152 12/17/15 1500  BP:      Pulse: (!) 54     Resp: 11     Temp:  97.8 F (36.6 C) 97.9 F (36.6 C) 98.6 F (37 C)  TempSrc:  Oral Oral Oral  SpO2: 96%     Weight:      Height:        Intake/Output Summary (Last 24 hours) at 12/17/15 1644 Last data filed at 12/17/15 1100  Gross per 24 hour  Intake          2453.75 ml  Output             4095 ml  Net         -1641.25 ml   Filed Weights   12/16/15 0435  Weight: 55.3 kg (122 lb)    Examination:  General exam: Appears calm and comfortable  Respiratory system: Clear to auscultation. Respiratory effort normal. Cardiovascular system: S1 & S2 heard, RRR. No JVD, murmurs, rubs, gallops or clicks. No pedal edema. Gastrointestinal system: Abdomen is nondistended, soft and non tender except at the RUQ area  Central nervous system: Alert and oriented. No focal neurological deficits. Extremities: Symmetric 5 x 5 power.  Data Reviewed: I have personally reviewed following labs and imaging studies  CBC:  Recent Labs Lab 12/16/15 0448 12/17/15 0327  WBC 10.1 7.4  HGB 10.9* 10.7*  HCT 32.6* 33.2*  MCV 87.6 90.7  PLT 321 289   Basic Metabolic Panel:  Recent Labs Lab 12/16/15 0448 12/17/15 0327  NA 135 141  K 3.6 3.9  CL 105 114*  CO2 25 24  GLUCOSE 110* 119*  BUN 10 9  CREATININE 0.72 0.54  CALCIUM 8.4* 8.4*   Liver Function Tests:  Recent Labs Lab 12/16/15 0448 12/17/15 0327  AST 13* 13*  ALT 10* 13*  ALKPHOS 49 45  BILITOT 0.6 0.3  PROT 7.0 6.6  ALBUMIN 3.2* 2.7*    Recent Labs Lab 12/16/15 0448  LIPASE 19   CBG:  Recent Labs Lab 12/16/15 1202  GLUCAP 98    Recent Results (from the past 240 hour(s))  MRSA PCR Screening     Status: None   Collection Time: 12/16/15  8:55 AM  Result Value Ref Range Status   MRSA by PCR NEGATIVE NEGATIVE Final    Comment:        The GeneXpert MRSA Assay (FDA approved for NASAL  specimens only), is one component of a comprehensive MRSA colonization surveillance program. It is not intended to diagnose MRSA infection nor to guide or monitor treatment for MRSA infections.   Culture, blood (routine x 2)     Status: None (Preliminary result)   Collection Time: 12/16/15  2:34 PM  Result Value Ref Range Status   Specimen Description BLOOD RIGHT ARM  Final   Special Requests BOTTLES DRAWN AEROBIC AND ANAEROBIC 5CC  Final   Culture   Final    NO GROWTH < 24 HOURS [facility]    Report Status PENDING  Incomplete  Culture, blood (routine x 2)     Status: None (Preliminary result)   Collection Time: 12/16/15  2:34 PM  Result Value Ref Range Status   Specimen Description BLOOD RIGHT HAND  Final   Special Requests IN PEDIATRIC BOTTLE 4CC  Final   Culture   Final    NO GROWTH < 24 HOURS [facility] at [facility]    Report Status PENDING  Incomplete      Radiology Studies: X-ray Chest Pa And Lateral  Result Date: 12/17/2015 CLINICAL DATA:  Shortness of breath. EXAM: CHEST  2 VIEW COMPARISON:  12/16/2015 . FINDINGS: Mediastinum and hilar structures normal. Borderline cardiomegaly. No pulmonary venous congestion Right middle lobe atelectasis and or infiltrate noted. No pleural effusion or pneumothorax. Small right pleural effusion. No pneumothorax. IMPRESSION: 1. Right middle lobe atelectasis and/or infiltrate. Right middle lobe pneumonia cannot be excluded. Aspiration cannot be excluded. Small right pleural effusion. 2.  Borderline cardiomegaly. Electronically Signed   By: Maisie Fushomas  Register   On: 12/17/2015 09:39   Ct Head Wo Contrast  Result Date: 12/16/2015 CLINICAL DATA:  40 year old female with acute altered mental status. EXAM: CT HEAD WITHOUT CONTRAST TECHNIQUE: Contiguous axial images were obtained from the base of the skull through the vertex without intravenous contrast. COMPARISON:  11/05/2012 FINDINGS: Brain: No evidence of  infarction, hemorrhage, hydrocephalus, extra-axial collection or mass lesion/mass effect. Vascular: No abnormalities identified. Skull: Unremarkable Sinuses/Orbits: Visualized portions unremarkable. Other: None IMPRESSION: Unremarkable noncontrast head CT. Electronically Signed   By: Harmon PierJeffrey  Hu M.D.   On: 12/16/2015 15:47   Dg Abd Acute W/chest  Result Date: 12/16/2015 CLINICAL DATA:  Abdominal pain with nausea and vomiting EXAM: DG ABDOMEN ACUTE W/ 1V CHEST COMPARISON:  CT pelvis 10/23/2014 FINDINGS: Heart size and vascularity normal. Mild airspace disease right medial lung base may represent pneumonia or atelectasis. Left lung clear. No effusion Nonobstructive bowel gas pattern. No air-fluid levels. Moderate stool in the  colon. No free air. No renal calculi. No acute skeletal abnormality. IMPRESSION: Mild airspace disease right medial lung base, may represent atelectasis or pneumonia Constipation without bowel  obstruction. Electronically Signed   By: Marlan Palau M.D.   On: 12/16/2015 07:20      Scheduled Meds: . azithromycin  500 mg Intravenous Q24H  . busPIRone  7.5 mg Oral BID  . cefTRIAXone (ROCEPHIN)  IV  1 g Intravenous Q24H  . enoxaparin (LOVENOX) injection  40 mg Subcutaneous Daily  . folic acid  1 mg Oral Daily  . multivitamin with minerals  1 tablet Oral Daily  . PARoxetine  20 mg Oral Daily  . sodium chloride flush  3 mL Intravenous Q12H  . thiamine  100 mg Oral Daily  . topiramate  50 mg Oral BID   Continuous Infusions: . dextrose 5 % and 0.9% NaCl 75 mL/hr at 12/17/15 1100     LOS: 1 day    Time spent: 20 minutes    Debbora Presto, MD [facility] Pager [phone]  If 7PM-7AM, please contact night-coverage [email or web address] Password [email or web address] 12/17/2015, 4:44 PM

## 2015-12-18 ENCOUNTER — Inpatient Hospital Stay (HOSPITAL_COMMUNITY): Payer: Self-pay

## 2015-12-18 LAB — PROCALCITONIN

## 2015-12-18 LAB — CBC
HCT: 35.5 % — ABNORMAL LOW (ref 36.0–46.0)
Hemoglobin: 11.7 g/dL — ABNORMAL LOW (ref 12.0–15.0)
MCH: 29.7 pg (ref 26.0–34.0)
MCHC: 33 g/dL (ref 30.0–36.0)
MCV: 90.1 fL (ref 78.0–100.0)
PLATELETS: 366 10*3/uL (ref 150–400)
RBC: 3.94 MIL/uL (ref 3.87–5.11)
RDW: 13.5 % (ref 11.5–15.5)
WBC: 6.8 10*3/uL (ref 4.0–10.5)

## 2015-12-18 LAB — BASIC METABOLIC PANEL
ANION GAP: 6 (ref 5–15)
BUN: 7 mg/dL (ref 6–20)
CALCIUM: 8.4 mg/dL — AB (ref 8.9–10.3)
CO2: 17 mmol/L — ABNORMAL LOW (ref 22–32)
CREATININE: 0.49 mg/dL (ref 0.44–1.00)
Chloride: 116 mmol/L — ABNORMAL HIGH (ref 101–111)
GFR calc Af Amer: >60 mL/min (ref >60)
GLUCOSE: 120 mg/dL — AB (ref 65–99)
Potassium: 3.4 mmol/L — ABNORMAL LOW (ref 3.5–5.1)
Sodium: 139 mmol/L (ref 135–145)

## 2015-12-18 MED ORDER — OXYCODONE-ACETAMINOPHEN 5-325 MG PO TABS
1.0000 | ORAL_TABLET | ORAL | Status: DC | PRN
  Administered 2015-12-18 – 2015-12-19 (×6): 2 via ORAL
  Filled 2015-12-18 (×6): qty 2

## 2015-12-18 MED ORDER — POTASSIUM CHLORIDE CRYS ER 20 MEQ PO TBCR
40.0000 meq | EXTENDED_RELEASE_TABLET | Freq: Once | ORAL | Status: AC
  Administered 2015-12-18: 40 meq via ORAL
  Filled 2015-12-18: qty 2

## 2015-12-18 MED ORDER — NICOTINE 21 MG/24HR TD PT24
21.0000 mg | MEDICATED_PATCH | Freq: Every day | TRANSDERMAL | Status: DC
  Administered 2015-12-18 – 2015-12-19 (×2): 21 mg via TRANSDERMAL
  Filled 2015-12-18 (×2): qty 1

## 2015-12-18 MED ORDER — AMOXICILLIN-POT CLAVULANATE 875-125 MG PO TABS
1.0000 | ORAL_TABLET | Freq: Two times a day (BID) | ORAL | Status: DC
  Administered 2015-12-18 – 2015-12-19 (×2): 1 via ORAL
  Filled 2015-12-18 (×2): qty 1

## 2015-12-18 MED ORDER — KETOROLAC TROMETHAMINE 30 MG/ML IJ SOLN: 30.0000 mg | Freq: Four times a day (QID) | INTRAMUSCULAR | Status: DC | PRN

## 2015-12-18 NOTE — Progress Notes (Signed)
Patient ID: Madison Garza, female   DOB: June 21, 1975, 40 y.o.   MRN: [id number]   PROGRESS NOTE    Madison Garza  [id number] DOB: June 21, 1975 DOA: 12/16/2015  PCP: Pcp Not In System   Brief Narrative:  40 y.o. female with known substance abuse, who presented to hospital with acute change in mental status. Apparently patient called EMS this morning, she was found psychotic, confused. She required multiple doses of Narcan, she was able to respond to simple questions to the emergency physician. Apparently she had overdosed in the past, had psychiatric admissions in the past. On this occasion there is no evidence of a suicidal attempt per discussion with emergency room physician.   Assessment & Plan:   1. Metabolic encephalopathy. Suspect related to cocaine OD, in the past she has also had amphetamines and THC, barbiturates in urine but not this AM. Her mental status is clear and please note that pt asked mom to leave room to discuss details of current medical condition. She admits to substance abuse and is aware she needs extensive social support to stop her habits. Improving overall.   2. Aspiration pneumonia. Change ABX to Augmentin   3. Bipolar and depression. Continue buspirone, paroxetine and Topamax.  4. Constipation. Will use Fleet enemas as needed. Clinical signs of bowel obstruction or obstipation.  5. RUQ pain - RUQ with no acute findings   DVT prophylaxis: Lovenox Sq Code Status: Full  Family Communication: Patient at bedside, mother at bedside but pt asked at one point for mother to leave the room  Disposition Plan: Home in few days   Consultants:   None  Procedures:   None  Antimicrobials:   Zithromax 8/31 -->   Rocephin 8/31 -->   Augmentin 9/2 -->   Subjective: Still with RUQ pain.   Objective: Vitals:   12/17/15 1733 12/17/15 2300 12/18/15 0609 12/18/15 1315  BP: 130/87 120/67 (!) 150/86 131/89  Pulse: (!) 55 (!) 58 (!) 58 61  Resp: 18 18 20 20    Temp: 98.4 F (36.9 C) 98.4 F (36.9 C) 98.2 F (36.8 C) 98.6 F (37 C)  TempSrc: Oral Oral Oral Oral  SpO2: 99% 98% 98% 99%  Weight:      Height:        Intake/Output Summary (Last 24 hours) at 12/18/15 1730 Last data filed at 12/18/15 1500  Gross per 24 hour  Intake             2160 ml  Output                0 ml  Net             2160 ml   Filed Weights   12/16/15 0435  Weight: 55.3 kg (122 lb)    Examination:  General exam: Appears calm and comfortable  Respiratory system: Clear to auscultation. Respiratory effort normal. Cardiovascular system: S1 & S2 heard, RRR. No JVD, murmurs, rubs, gallops or clicks. No pedal edema. Gastrointestinal system: Abdomen is nondistended, soft and non tender except at the RUQ area  Central nervous system: Alert and oriented. No focal neurological deficits. Extremities: Symmetric 5 x 5 power.  Data Reviewed: I have personally reviewed following labs and imaging studies  CBC:  Recent Labs Lab 12/16/15 0448 12/17/15 0327 12/18/15 0513  WBC 10.1 7.4 6.8  HGB 10.9* 10.7* 11.7*  HCT 32.6* 33.2* 35.5*  MCV 87.6 90.7 90.1  PLT 321 289 366   Basic Metabolic Panel:  Recent  Labs Lab 12/16/15 0448 12/17/15 0327 12/18/15 0513  NA 135 141 139  K 3.6 3.9 3.4*  CL 105 114* 116*  CO2 25 24 17*  GLUCOSE 110* 119* 120*  BUN 10 9 7   CREATININE 0.72 0.54 0.49  CALCIUM 8.4* 8.4* 8.4*   Liver Function Tests:  Recent Labs Lab 12/16/15 0448 12/17/15 0327  AST 13* 13*  ALT 10* 13*  ALKPHOS 49 45  BILITOT 0.6 0.3  PROT 7.0 6.6  ALBUMIN 3.2* 2.7*    Recent Labs Lab 12/16/15 0448  LIPASE 19   CBG:  Recent Labs Lab 12/16/15 1202  GLUCAP 98    Recent Results (from the past 240 hour(s))  MRSA PCR Screening     Status: None   Collection Time: 12/16/15  8:55 AM  Result Value Ref Range Status   MRSA by PCR NEGATIVE NEGATIVE Final    Comment:        The GeneXpert MRSA Assay (FDA approved for NASAL specimens only), is  one component of a comprehensive MRSA colonization surveillance program. It is not intended to diagnose MRSA infection nor to guide or monitor treatment for MRSA infections.   Culture, blood (routine x 2)     Status: None (Preliminary result)   Collection Time: 12/16/15  2:34 PM  Result Value Ref Range Status   Specimen Description BLOOD RIGHT ARM  Final   Special Requests BOTTLES DRAWN AEROBIC AND ANAEROBIC 5CC  Final   Culture   Final    NO GROWTH < 24 HOURS Performed at [facility]    Report Status PENDING  Incomplete  Culture, blood (routine x 2)     Status: None (Preliminary result)   Collection Time: 12/16/15  2:34 PM  Result Value Ref Range Status   Specimen Description BLOOD RIGHT HAND  Final   Special Requests IN PEDIATRIC BOTTLE 4CC  Final   Culture   Final    NO GROWTH < 24 HOURS Performed at [facility]    Report Status PENDING  Incomplete      Radiology Studies: X-ray Chest Pa And Lateral  Result Date: 12/17/2015 CLINICAL DATA:  Shortness of breath. EXAM: CHEST  2 VIEW COMPARISON:  12/16/2015 . FINDINGS: Mediastinum and hilar structures normal. Borderline cardiomegaly. No pulmonary venous congestion Right middle lobe atelectasis and or infiltrate noted. No pleural effusion or pneumothorax. Small right pleural effusion. No pneumothorax. IMPRESSION: 1. Right middle lobe atelectasis and/or infiltrate. Right middle lobe pneumonia cannot be excluded. Aspiration cannot be excluded. Small right pleural effusion. 2.  Borderline cardiomegaly. Electronically Signed   By: Maisie Fus  Register   On: 12/17/2015 09:39   US Abdomen Limited Ruq  Result Date: 12/18/2015 CLINICAL DATA:  Right upper quadrant abdominal pain for 2 weeks. EXAM: US ABDOMEN LIMITED - RIGHT UPPER QUADRANT COMPARISON:  None. FINDINGS: Gallbladder: No gallstones or wall thickening visualized. No sonographic Murphy sign noted by sonographer. Common bile duct: Diameter: 1.7 mm. Liver: Normal  echogenicity without focal lesion or biliary dilatation. IMPRESSION: Normal right upper quadrant abdominal ultrasound examination. Electronically Signed   By: Rudie Meyer M.D.   On: 12/18/2015 08:33      Scheduled Meds: . amoxicillin-clavulanate  1 tablet Oral Q12H  . busPIRone  7.5 mg Oral BID  . enoxaparin (LOVENOX) injection  40 mg Subcutaneous Daily  . folic acid  1 mg Oral Daily  . multivitamin with minerals  1 tablet Oral Daily  . PARoxetine  20 mg Oral Daily  . sodium chloride  flush  3 mL Intravenous Q12H  . thiamine  100 mg Oral Daily  . topiramate  50 mg Oral BID   Continuous Infusions: . dextrose 5 % and 0.9% NaCl 75 mL/hr at 12/18/15 0620     LOS: 2 days    Time spent: 20 minutes    Debbora Presto, MD [facility] Pager [phone]  If 7PM-7AM, please contact night-coverage [email or web address] Password [email or web address] 12/18/2015, 5:30 PM

## 2015-12-19 ENCOUNTER — Other Ambulatory Visit: Payer: Self-pay | Admitting: Internal Medicine

## 2015-12-19 DIAGNOSIS — K5900 Constipation, unspecified: Secondary | ICD-10-CM

## 2015-12-19 LAB — BASIC METABOLIC PANEL
Anion gap: 4 — ABNORMAL LOW (ref 5–15)
BUN: 5 mg/dL — AB (ref 6–20)
CALCIUM: 8.7 mg/dL — AB (ref 8.9–10.3)
CO2: 21 mmol/L — ABNORMAL LOW (ref 22–32)
CREATININE: 0.57 mg/dL (ref 0.44–1.00)
Chloride: 114 mmol/L — ABNORMAL HIGH (ref 101–111)
Glucose, Bld: 102 mg/dL — ABNORMAL HIGH (ref 65–99)
Potassium: 3.6 mmol/L (ref 3.5–5.1)
SODIUM: 139 mmol/L (ref 135–145)

## 2015-12-19 LAB — CBC
HCT: 36.2 % (ref 36.0–46.0)
HEMOGLOBIN: 11.9 g/dL — AB (ref 12.0–15.0)
MCH: 29.7 pg (ref 26.0–34.0)
MCHC: 32.9 g/dL (ref 30.0–36.0)
MCV: 90.3 fL (ref 78.0–100.0)
PLATELETS: 392 10*3/uL (ref 150–400)
RBC: 4.01 MIL/uL (ref 3.87–5.11)
RDW: 13.8 % (ref 11.5–15.5)
WBC: 5.6 10*3/uL (ref 4.0–10.5)

## 2015-12-19 MED ORDER — OXYCODONE-ACETAMINOPHEN 5-325 MG PO TABS: 1.0000 | ORAL_TABLET | ORAL | 0 refills | Status: DC | PRN

## 2015-12-19 MED ORDER — BENZONATATE 200 MG PO CAPS: 200.0000 mg | ORAL_CAPSULE | Freq: Three times a day (TID) | ORAL | 0 refills | Status: DC | PRN

## 2015-12-19 MED ORDER — TOPIRAMATE 50 MG PO TABS: 50.0000 mg | ORAL_TABLET | Freq: Two times a day (BID) | ORAL | 0 refills | Status: DC

## 2015-12-19 MED ORDER — METHOCARBAMOL 500 MG PO TABS: 500.0000 mg | ORAL_TABLET | Freq: Three times a day (TID) | ORAL | 0 refills | Status: DC | PRN

## 2015-12-19 MED ORDER — AMOXICILLIN-POT CLAVULANATE 875-125 MG PO TABS: 1.0000 | ORAL_TABLET | Freq: Two times a day (BID) | ORAL | 0 refills | Status: DC

## 2015-12-19 NOTE — Discharge Instructions (Signed)

## 2015-12-19 NOTE — Discharge Summary (Signed)
Physician Discharge Summary  Madison Garza:[id number] DOB: 1976-02-20 DOA: 12/16/2015  PCP: Pcp Not In System  Admit date: 12/16/2015 Discharge date: 12/19/2015  Recommendations for Outpatient Follow-up:  1. Pt will need to follow up with PCP in 2-3 weeks post discharge 2. Please obtain BMP to evaluate electrolytes and kidney function 3. Please also check CBC to evaluate Hg and Hct levels 4. Augmentin for 5 more days post discharge   Discharge Diagnoses:  Active Problems:   PNA (pneumonia)   Encephalopathy acute   Encephalopathy  Discharge Condition: Stable  Diet recommendation: Heart healthy diet discussed in details   Brief Narrative:  40 y.o.femalewith known substance abuse, who presented to hospital with acute change in mental status. Apparently patient called EMS this morning, she was found psychotic, confused. She required multiple doses of Narcan, she was able to respond to simple questions to the emergency physician. Apparently she had overdosed in the past, had psychiatric admissions in the past. On this occasion there is no evidence of a suicidal attempt perdiscussion with emergency room physician.   Assessment & Plan:   1. Metabolic encephalopathy. Suspect related to cocaine OD, in the past she has also had amphetamines and THC, barbiturates in urine but not this AM. Her mental status is clear this am, she says she wants to go home and other than pain on the right chest area, she is able to ambulate.   2. Aspiration pneumonia, RML. With significant pain and evidence of discomfort, witnessed by RN, myself and mom. Ok to use analgesia, mom at bedside explains she will monitor use of analgesia. Continue Augmentin upon discharge to complete therapy.   3. Bipolar and depression. Continue previous home regimen .  4. Constipation. Had BM this AM.   5. RUQ pain - RUQ [place] with no acute findings, pt still in pain but better with analgesia.   DVT prophylaxis: Lovenox  Sq Code Status: Full  Family Communication: Patient at bedside, mother at bedside  Disposition Plan: Home   Consultants:   None  Procedures:   None  Antimicrobials:   Augmentin 9/2 -->  Procedures/Studies: X-ray Chest Pa And Lateral  Result Date: 12/17/2015 CLINICAL DATA:  Shortness of breath. EXAM: CHEST  2 VIEW COMPARISON:  12/16/2015 . FINDINGS: Mediastinum and hilar structures normal. Borderline cardiomegaly. No pulmonary venous congestion Right middle lobe atelectasis and or infiltrate noted. No pleural effusion or pneumothorax. Small right pleural effusion. No pneumothorax. IMPRESSION: 1. Right middle lobe atelectasis and/or infiltrate. Right middle lobe pneumonia cannot be excluded. Aspiration cannot be excluded. Small right pleural effusion. 2.  Borderline cardiomegaly. Electronically Signed   By: Maisie Fus  Register   On: 12/17/2015 09:39   Ct Head Wo Contrast  Result Date: 12/16/2015 CLINICAL DATA:  40 year old female with acute altered mental status. EXAM: CT HEAD WITHOUT CONTRAST TECHNIQUE: Contiguous axial images were obtained from the base of the skull through the vertex without intravenous contrast. COMPARISON:  11/05/2012 FINDINGS: Brain: No evidence of infarction, hemorrhage, hydrocephalus, extra-axial collection or mass lesion/mass effect. Vascular: No abnormalities identified. Skull: Unremarkable Sinuses/Orbits: Visualized portions unremarkable. Other: None IMPRESSION: Unremarkable noncontrast head CT. Electronically Signed   By: Madison Garza M.D.   On: 12/16/2015 15:47   Dg Abd Acute W/chest  Result Date: 12/16/2015 CLINICAL DATA:  Abdominal pain with nausea and vomiting EXAM: DG ABDOMEN ACUTE W/ 1V CHEST COMPARISON:  CT pelvis 10/23/2014 FINDINGS: Heart size and vascularity normal. Mild airspace disease right medial lung base may represent pneumonia or atelectasis.  Left lung clear. No effusion Nonobstructive bowel gas pattern. No air-fluid levels. Moderate stool in the  colon. No free air. No renal calculi. No acute skeletal abnormality. IMPRESSION: Mild airspace disease right medial lung base, may represent atelectasis or pneumonia Constipation without bowel  obstruction. Electronically Signed   By: Madison Garza M.D.   On: 12/16/2015 07:20   US Abdomen Limited Ruq  Result Date: 12/18/2015 CLINICAL DATA:  Right upper quadrant abdominal pain for 2 weeks. EXAM: US ABDOMEN LIMITED - RIGHT UPPER QUADRANT COMPARISON:  None. FINDINGS: Gallbladder: No gallstones or wall thickening visualized. No sonographic Murphy sign noted by sonographer. Common bile duct: Diameter: 1.7 mm. Liver: Normal echogenicity without focal lesion or biliary dilatation. IMPRESSION: Normal right upper quadrant abdominal ultrasound examination. Electronically Signed   By: Rudie Meyer M.D.   On: 12/18/2015 08:33     Discharge Exam: Vitals:   12/18/15 2126 12/19/15 0700  BP: 120/79 (!) 143/90  Pulse: (!) 56 (!) 51  Resp: 20 18  Temp: 98.7 F (37.1 C) 97.8 F (36.6 C)   Vitals:   12/18/15 0609 12/18/15 1315 12/18/15 2126 12/19/15 0700  BP: (!) 150/86 131/89 120/79 (!) 143/90  Pulse: (!) 58 61 (!) 56 (!) 51  Resp: 20 20 20 18   Temp: 98.2 F (36.8 C) 98.6 F (37 C) 98.7 F (37.1 C) 97.8 F (36.6 C)  TempSrc: Oral Oral Oral Oral  SpO2: 98% 99% 98% 98%  Weight:      Height:        General: Pt is alert, follows commands appropriately, not in acute distress Cardiovascular: Regular rate and rhythm, S1/S2 +, no murmurs, no rubs, no gallops Respiratory: Clear to auscultation bilaterally, no wheezing, rhonchi noted on right lower and middle chest area  Abdominal: Soft, non tender, non distended, bowel sounds +, no guarding  Discharge Instructions  Discharge Instructions    Diet - low sodium heart healthy    Complete by:  As directed   Increase activity slowly    Complete by:  As directed       Medication List    STOP taking these medications   ALPRAZolam 0.5 MG  tablet Commonly known as:  XANAX   meloxicam 15 MG tablet Commonly known as:  MOBIC   SUMAtriptan 100 MG tablet Commonly known as:  IMITREX     TAKE these medications   albuterol 108 (90 Base) MCG/ACT inhaler Commonly known as:  PROVENTIL HFA;VENTOLIN HFA Inhale 2 puffs into the lungs every 6 (six) hours as needed for wheezing or shortness of breath.   amoxicillin-clavulanate 875-125 MG tablet Commonly known as:  AUGMENTIN Take 1 tablet by mouth every 12 (twelve) hours.   benzonatate 200 MG capsule Commonly known as:  TESSALON Take 1 capsule (200 mg total) by mouth 3 (three) times daily as needed for cough.   busPIRone 7.5 MG tablet Commonly known as:  BUSPAR Take 7.5-15 mg by mouth daily.   methocarbamol 500 MG tablet Commonly known as:  ROBAXIN Take 1 tablet (500 mg total) by mouth every 8 (eight) hours as needed for muscle spasms.   oxyCODONE-acetaminophen 5-325 MG tablet Commonly known as:  PERCOCET/ROXICET Take 1-2 tablets by mouth every 3 (three) hours as needed for moderate pain.   PARoxetine 20 MG tablet Commonly known as:  PAXIL Take 1 tablet (20 mg total) by mouth daily.   topiramate 50 MG tablet Commonly known as:  TOPAMAX Take 1 tablet (50 mg total) by mouth 2 (two) times daily.  Follow-up Information    [facility] .   Contact information: [address] [place] [phone]           The results of significant diagnostics from this hospitalization (including imaging, microbiology, ancillary and laboratory) are listed below for reference.     Microbiology: Recent Results (from the past 240 hour(s))  MRSA PCR Screening     Status: None   Collection Time: 12/16/15  8:55 AM  Result Value Ref Range Status   MRSA by PCR NEGATIVE NEGATIVE Final    Comment:        The GeneXpert MRSA Assay (FDA approved for NASAL specimens only), is one component of a comprehensive MRSA  colonization surveillance program. It is not intended to diagnose MRSA infection nor to guide or monitor treatment for MRSA infections.   Culture, blood (routine x 2)     Status: None (Preliminary result)   Collection Time: 12/16/15  2:34 PM  Result Value Ref Range Status   Specimen Description BLOOD RIGHT ARM  Final   Special Requests BOTTLES DRAWN AEROBIC AND ANAEROBIC 5CC  Final   Culture   Final    NO GROWTH 2 DAYS [facility] at [facility]    Report Status PENDING  Incomplete  Culture, blood (routine x 2)     Status: None (Preliminary result)   Collection Time: 12/16/15  2:34 PM  Result Value Ref Range Status   Specimen Description BLOOD RIGHT HAND  Final   Special Requests IN PEDIATRIC BOTTLE 4CC  Final   Culture   Final    NO GROWTH 2 DAYS [facility]formed at [facility]     Report Status PENDING  Incomplete     Labs: Basic Metabolic Panel:  Recent Labs Lab 12/16/15 0448 12/17/15 0327 12/18/15 0513 12/19/15 0555  NA 135 141 139 139  K 3.6 3.9 3.4* 3.6  CL 105 114* 116* 114*  CO2 25 24 17* 21*  GLUCOSE 110* 119* 120* 102*  BUN 10 9 7  5*  CREATININE 0.72 0.54 0.49 0.57  CALCIUM 8.4* 8.4* 8.4* 8.7*   Liver Function Tests:  Recent Labs Lab 12/16/15 0448 12/17/15 0327  AST 13* 13*  ALT 10* 13*  ALKPHOS 49 45  BILITOT 0.6 0.3  PROT 7.0 6.6  ALBUMIN 3.2* 2.7*    Recent Labs Lab 12/16/15 0448  LIPASE 19   CBC:  Recent Labs Lab 12/16/15 0448 12/17/15 0327 12/18/15 0513 12/19/15 0555  WBC 10.1 7.4 6.8 5.6  HGB 10.9* 10.7* 11.7* 11.9*  HCT 32.6* 33.2* 35.5* 36.2  MCV 87.6 90.7 90.1 90.3  PLT 321 289 366 392   CBG:  Recent Labs Lab 12/16/15 1202  GLUCAP 98     SIGNED: Time coordinating discharge: 30 minutes  MAGICK-Duha Abair, MD  [facility] 12/19/2015, 10:44 AM Pager [phone]  If 7PM-7AM, please contact night-coverage [email or web address] Password [email or web address]

## 2015-12-21 LAB — CULTURE, BLOOD (ROUTINE X 2)
Culture: NO GROWTH
Culture: NO GROWTH

## 2016-01-17 ENCOUNTER — Emergency Department (HOSPITAL_COMMUNITY): Payer: Medicaid Other

## 2016-01-17 ENCOUNTER — Inpatient Hospital Stay (HOSPITAL_COMMUNITY): Payer: Medicaid Other

## 2016-01-17 ENCOUNTER — Encounter (HOSPITAL_COMMUNITY): Payer: Self-pay | Admitting: Emergency Medicine

## 2016-01-17 ENCOUNTER — Encounter (HOSPITAL_COMMUNITY): Admission: EM | Disposition: A | Discharge status: Home / Self Care | Payer: Self-pay | Source: Home / Self Care

## 2016-01-17 ENCOUNTER — Inpatient Hospital Stay (HOSPITAL_COMMUNITY)
Admission: EM | Admit: 2016-01-17 | Discharge: 2016-01-21 | DRG: 958 | Disposition: A | Discharge status: Home / Self Care | Payer: Medicaid Other | Attending: Orthopedic Surgery | Admitting: Orthopedic Surgery

## 2016-01-17 ENCOUNTER — Inpatient Hospital Stay (HOSPITAL_COMMUNITY): Payer: Medicaid Other | Admitting: Anesthesiology

## 2016-01-17 DIAGNOSIS — Z79899 Other long term (current) drug therapy: Secondary | ICD-10-CM

## 2016-01-17 DIAGNOSIS — R52 Pain, unspecified: Secondary | ICD-10-CM

## 2016-01-17 DIAGNOSIS — S270XXA Traumatic pneumothorax, initial encounter: Secondary | ICD-10-CM | POA: Diagnosis present

## 2016-01-17 DIAGNOSIS — S2232XA Fracture of one rib, left side, initial encounter for closed fracture: Secondary | ICD-10-CM | POA: Diagnosis present

## 2016-01-17 DIAGNOSIS — F17210 Nicotine dependence, cigarettes, uncomplicated: Secondary | ICD-10-CM | POA: Diagnosis present

## 2016-01-17 DIAGNOSIS — F319 Bipolar disorder, unspecified: Secondary | ICD-10-CM | POA: Diagnosis present

## 2016-01-17 DIAGNOSIS — S22009A Unspecified fracture of unspecified thoracic vertebra, initial encounter for closed fracture: Secondary | ICD-10-CM | POA: Diagnosis present

## 2016-01-17 DIAGNOSIS — Z23 Encounter for immunization: Secondary | ICD-10-CM

## 2016-01-17 DIAGNOSIS — Z419 Encounter for procedure for purposes other than remedying health state, unspecified: Secondary | ICD-10-CM

## 2016-01-17 DIAGNOSIS — Y29XXXA Contact with blunt object, undetermined intent, initial encounter: Secondary | ICD-10-CM | POA: Diagnosis present

## 2016-01-17 DIAGNOSIS — R0781 Pleurodynia: Secondary | ICD-10-CM

## 2016-01-17 DIAGNOSIS — S22049A Unspecified fracture of fourth thoracic vertebra, initial encounter for closed fracture: Secondary | ICD-10-CM | POA: Diagnosis present

## 2016-01-17 DIAGNOSIS — S060XAA Concussion with loss of consciousness status unknown, initial encounter: Secondary | ICD-10-CM | POA: Diagnosis present

## 2016-01-17 DIAGNOSIS — S272XXA Traumatic hemopneumothorax, initial encounter: Secondary | ICD-10-CM

## 2016-01-17 DIAGNOSIS — S22039A Unspecified fracture of third thoracic vertebra, initial encounter for closed fracture: Secondary | ICD-10-CM | POA: Diagnosis present

## 2016-01-17 DIAGNOSIS — S8012XA Contusion of left lower leg, initial encounter: Secondary | ICD-10-CM | POA: Diagnosis present

## 2016-01-17 DIAGNOSIS — S52022B Displaced fracture of olecranon process without intraarticular extension of left ulna, initial encounter for open fracture type I or II: Principal | ICD-10-CM | POA: Diagnosis present

## 2016-01-17 DIAGNOSIS — S060X9A Concussion with loss of consciousness of unspecified duration, initial encounter: Secondary | ICD-10-CM | POA: Diagnosis present

## 2016-01-17 HISTORY — PX: ORIF ELBOW FRACTURE: SHX5031

## 2016-01-17 LAB — CBC
HEMATOCRIT: 46.5 % — AB (ref 36.0–46.0)
Hemoglobin: 15.1 g/dL — ABNORMAL HIGH (ref 12.0–15.0)
MCH: 29.5 pg (ref 26.0–34.0)
MCHC: 32.5 g/dL (ref 30.0–36.0)
MCV: 90.8 fL (ref 78.0–100.0)
Platelets: 271 10*3/uL (ref 150–400)
RBC: 5.12 MIL/uL — ABNORMAL HIGH (ref 3.87–5.11)
RDW: 13.7 % (ref 11.5–15.5)
WBC: 11.7 10*3/uL — ABNORMAL HIGH (ref 4.0–10.5)

## 2016-01-17 LAB — RAPID URINE DRUG SCREEN, HOSP PERFORMED
AMPHETAMINES: NOT DETECTED
BENZODIAZEPINES: NOT DETECTED
Barbiturates: NOT DETECTED
COCAINE: POSITIVE — AB
OPIATES: NOT DETECTED
Tetrahydrocannabinol: NOT DETECTED

## 2016-01-17 LAB — LIPASE, BLOOD: Lipase: 32 U/L (ref 11–51)

## 2016-01-17 LAB — ETHANOL: Alcohol, Ethyl (B): <5 mg/dL (ref <5)

## 2016-01-17 LAB — COMPREHENSIVE METABOLIC PANEL
ALK PHOS: 73 U/L (ref 38–126)
ALT: 191 U/L — ABNORMAL HIGH (ref 14–54)
ANION GAP: 10 (ref 5–15)
AST: 279 U/L — ABNORMAL HIGH (ref 15–41)
Albumin: 3.7 g/dL (ref 3.5–5.0)
BILIRUBIN TOTAL: 0.4 mg/dL (ref 0.3–1.2)
BUN: 22 mg/dL — ABNORMAL HIGH (ref 6–20)
CALCIUM: 9 mg/dL (ref 8.9–10.3)
CO2: 22 mmol/L (ref 22–32)
CREATININE: 1.1 mg/dL — AB (ref 0.44–1.00)
Chloride: 107 mmol/L (ref 101–111)
Glucose, Bld: 107 mg/dL — ABNORMAL HIGH (ref 65–99)
Potassium: 3.7 mmol/L (ref 3.5–5.1)
Sodium: 139 mmol/L (ref 135–145)
TOTAL PROTEIN: 7 g/dL (ref 6.5–8.1)

## 2016-01-17 LAB — TYPE AND SCREEN
ABO/RH(D): A POS
ANTIBODY SCREEN: NEGATIVE

## 2016-01-17 LAB — POC URINE PREG, ED: PREG TEST UR: NEGATIVE

## 2016-01-17 LAB — ABO/RH: ABO/RH(D): A POS

## 2016-01-17 SURGERY — OPEN REDUCTION INTERNAL FIXATION (ORIF) ELBOW/OLECRANON FRACTURE
Anesthesia: General | Site: Elbow | Laterality: Left

## 2016-01-17 MED ORDER — POTASSIUM CHLORIDE IN NACL 20-0.45 MEQ/L-% IV SOLN
INTRAVENOUS | Status: DC
  Administered 2016-01-17: 50 mL/h via INTRAVENOUS
  Filled 2016-01-17: qty 1000

## 2016-01-17 MED ORDER — PROPOFOL 10 MG/ML IV BOLUS
INTRAVENOUS | Status: DC | PRN
  Administered 2016-01-17: 200 mg via INTRAVENOUS

## 2016-01-17 MED ORDER — DEXAMETHASONE SODIUM PHOSPHATE 10 MG/ML IJ SOLN
INTRAMUSCULAR | Status: DC | PRN
  Administered 2016-01-17: 10 mg via INTRAVENOUS

## 2016-01-17 MED ORDER — SODIUM CHLORIDE 0.9 % IV BOLUS (SEPSIS)
1000.0000 mL | Freq: Once | INTRAVENOUS | Status: AC
  Administered 2016-01-17: 1000 mL via INTRAVENOUS

## 2016-01-17 MED ORDER — FENTANYL CITRATE (PF) 100 MCG/2ML IJ SOLN
INTRAMUSCULAR | Status: DC | PRN
  Administered 2016-01-17: 50 ug via INTRAVENOUS

## 2016-01-17 MED ORDER — 0.9 % SODIUM CHLORIDE (POUR BTL) OPTIME
TOPICAL | Status: DC | PRN
  Administered 2016-01-17: 1000 mL

## 2016-01-17 MED ORDER — MIDAZOLAM HCL 2 MG/2ML IJ SOLN
2.0000 mg | Freq: Once | INTRAMUSCULAR | Status: AC
  Administered 2016-01-17: 2 mg via INTRAVENOUS

## 2016-01-17 MED ORDER — PHENYLEPHRINE HCL 10 MG/ML IJ SOLN
INTRAMUSCULAR | Status: DC | PRN
  Administered 2016-01-17: 80 ug via INTRAVENOUS

## 2016-01-17 MED ORDER — IOPAMIDOL (ISOVUE-300) INJECTION 61%
INTRAVENOUS | Status: AC
  Administered 2016-01-17: 100 mL
  Filled 2016-01-17: qty 100

## 2016-01-17 MED ORDER — SUCCINYLCHOLINE CHLORIDE 20 MG/ML IJ SOLN
INTRAMUSCULAR | Status: DC | PRN
  Administered 2016-01-17: 100 mg via INTRAVENOUS

## 2016-01-17 MED ORDER — LACTATED RINGERS IV SOLN
INTRAVENOUS | Status: DC
  Administered 2016-01-17 (×2): via INTRAVENOUS

## 2016-01-17 MED ORDER — HYDROMORPHONE HCL 1 MG/ML IJ SOLN: 0.2500 mg | INTRAMUSCULAR | Status: DC | PRN

## 2016-01-17 MED ORDER — MIDAZOLAM HCL 2 MG/2ML IJ SOLN
INTRAMUSCULAR | Status: AC
  Filled 2016-01-17: qty 2

## 2016-01-17 MED ORDER — CEFAZOLIN IN D5W 1 GM/50ML IV SOLN
1.0000 g | Freq: Once | INTRAVENOUS | Status: AC
Start: 2016-01-17 — End: 2016-01-17
  Administered 2016-01-17: 1 g via INTRAVENOUS
  Filled 2016-01-17: qty 50

## 2016-01-17 MED ORDER — PROMETHAZINE HCL 25 MG/ML IJ SOLN: 6.2500 mg | INTRAMUSCULAR | Status: DC | PRN

## 2016-01-17 MED ORDER — ROPIVACAINE HCL 7.5 MG/ML IJ SOLN
INTRAMUSCULAR | Status: DC | PRN
  Administered 2016-01-17: 20 mL via PERINEURAL

## 2016-01-17 MED ORDER — FENTANYL CITRATE (PF) 100 MCG/2ML IJ SOLN
50.0000 ug | INTRAMUSCULAR | Status: DC | PRN
  Administered 2016-01-17 (×4): 50 ug via INTRAVENOUS
  Filled 2016-01-17 (×3): qty 2

## 2016-01-17 MED ORDER — MIDAZOLAM HCL 2 MG/2ML IJ SOLN
INTRAMUSCULAR | Status: AC
  Administered 2016-01-17: 2 mg via INTRAVENOUS
  Filled 2016-01-17: qty 2

## 2016-01-17 MED ORDER — OXYCODONE-ACETAMINOPHEN 5-325 MG PO TABS
1.0000 | ORAL_TABLET | Freq: Once | ORAL | Status: DC
  Filled 2016-01-17: qty 1

## 2016-01-17 MED ORDER — FENTANYL CITRATE (PF) 100 MCG/2ML IJ SOLN
INTRAMUSCULAR | Status: AC
  Filled 2016-01-17: qty 2

## 2016-01-17 MED ORDER — ONDANSETRON HCL 4 MG/2ML IJ SOLN
INTRAMUSCULAR | Status: DC | PRN
  Administered 2016-01-17: 4 mg via INTRAVENOUS

## 2016-01-17 MED ORDER — TETANUS-DIPHTH-ACELL PERTUSSIS 5-2.5-18.5 LF-MCG/0.5 IM SUSP
0.5000 mL | Freq: Once | INTRAMUSCULAR | Status: AC
  Administered 2016-01-17: 0.5 mL via INTRAMUSCULAR
  Filled 2016-01-17: qty 0.5

## 2016-01-17 MED ORDER — POVIDONE-IODINE 10 % EX SWAB: 2.0000 "application " | Freq: Once | CUTANEOUS | Status: DC

## 2016-01-17 MED ORDER — FENTANYL CITRATE (PF) 100 MCG/2ML IJ SOLN: 50.0000 ug | Freq: Once | INTRAMUSCULAR | Status: DC

## 2016-01-17 MED ORDER — FENTANYL CITRATE (PF) 100 MCG/2ML IJ SOLN
100.0000 ug | Freq: Once | INTRAMUSCULAR | Status: AC
  Administered 2016-01-17: 100 ug via INTRAVENOUS

## 2016-01-17 MED ORDER — CHLORHEXIDINE GLUCONATE 4 % EX LIQD: 60.0000 mL | Freq: Once | CUTANEOUS | Status: DC

## 2016-01-17 MED ORDER — CEFAZOLIN SODIUM-DEXTROSE 2-4 GM/100ML-% IV SOLN
INTRAVENOUS | Status: AC
  Filled 2016-01-17: qty 100

## 2016-01-17 MED ORDER — CEFAZOLIN SODIUM-DEXTROSE 2-4 GM/100ML-% IV SOLN
2.0000 g | INTRAVENOUS | Status: AC
  Administered 2016-01-17: 2 g via INTRAVENOUS

## 2016-01-17 SURGICAL SUPPLY — 59 items
BIT DRILL 2.5X2.75 QC CALB (BIT) ×3 IMPLANT
BIT DRILL CALIBRATED 2.7 (BIT) ×2 IMPLANT
BIT DRILL CALIBRATED 2.7MM (BIT) ×1
BNDG ESMARK 4X9 LF (GAUZE/BANDAGES/DRESSINGS) IMPLANT
CANISTER SUCTION 1500CC (MISCELLANEOUS) IMPLANT
CLOSURE WOUND 1/2 X4 (GAUZE/BANDAGES/DRESSINGS)
COVER SURGICAL LIGHT HANDLE (MISCELLANEOUS) ×3 IMPLANT
CUFF TOURNIQUET SINGLE 18IN (TOURNIQUET CUFF) ×3 IMPLANT
DRAPE C-ARM 42X72 X-RAY (DRAPES) ×6 IMPLANT
DRAPE IMP U-DRAPE 54X76 (DRAPES) ×3 IMPLANT
DRAPE INCISE IOBAN 66X45 STRL (DRAPES) ×3 IMPLANT
DRAPE U-SHAPE 47X51 STRL (DRAPES) ×3 IMPLANT
DRSG ADAPTIC 3X8 NADH LF (GAUZE/BANDAGES/DRESSINGS) ×3 IMPLANT
DRSG TEGADERM 4X4.75 (GAUZE/BANDAGES/DRESSINGS) ×12 IMPLANT
ELECT CAUTERY BLADE 6.4 (BLADE) ×3 IMPLANT
ELECT REM PT RETURN 9FT ADLT (ELECTROSURGICAL) ×3
ELECTRODE REM PT RTRN 9FT ADLT (ELECTROSURGICAL) ×1 IMPLANT
GAUZE SPONGE 4X4 12PLY STRL (GAUZE/BANDAGES/DRESSINGS) ×3 IMPLANT
GAUZE XEROFORM 1X8 LF (GAUZE/BANDAGES/DRESSINGS) ×3 IMPLANT
GAUZE XEROFORM 5X9 LF (GAUZE/BANDAGES/DRESSINGS) ×3 IMPLANT
GLOVE BIO SURGEON STRL SZ 6.5 (GLOVE) ×2 IMPLANT
GLOVE BIO SURGEONS STRL SZ 6.5 (GLOVE) ×1
GLOVE SKINSENSE NS SZ7.5 (GLOVE) ×6
GLOVE SKINSENSE STRL SZ7.5 (GLOVE) ×3 IMPLANT
GLOVE SURG SS PI 7.5 STRL IVOR (GLOVE) ×3 IMPLANT
GOWN STRL REIN XL XLG (GOWN DISPOSABLE) ×6 IMPLANT
K-WIRE ACE 1.6X6 (WIRE) ×12
KIT BASIN OR (CUSTOM PROCEDURE TRAY) ×3 IMPLANT
KIT ROOM TURNOVER OR (KITS) ×3 IMPLANT
KWIRE ACE 1.6X6 (WIRE) ×4 IMPLANT
NS IRRIG 1000ML POUR BTL (IV SOLUTION) ×9 IMPLANT
PACK SHOULDER (CUSTOM PROCEDURE TRAY) ×3 IMPLANT
PACK UNIVERSAL I (CUSTOM PROCEDURE TRAY) ×3 IMPLANT
PAD ARMBOARD 7.5X6 YLW CONV (MISCELLANEOUS) ×6 IMPLANT
PAD CAST 4YDX4 CTTN HI CHSV (CAST SUPPLIES) ×1 IMPLANT
PADDING CAST ABS 4INX4YD NS (CAST SUPPLIES) ×2
PADDING CAST ABS COTTON 4X4 ST (CAST SUPPLIES) ×1 IMPLANT
PADDING CAST COTTON 4X4 STRL (CAST SUPPLIES) ×2
PLATE OLECRANON SM (Plate) ×3 IMPLANT
SCREW CORTICAL LOW PROF 3.5X20 (Screw) ×3 IMPLANT
SCREW LOCK CORT STAR 3.5X18 (Screw) ×6 IMPLANT
SCREW LOW PROFILE 18MMX3.5MM (Screw) ×6 IMPLANT
SCREW LOW PROFILE 22MMX3.5MM (Screw) ×3 IMPLANT
SCREW LP 3.5 (Screw) ×3 IMPLANT
SLING ARM IMMOBILIZER LRG (SOFTGOODS) ×3 IMPLANT
SPLINT PLASTER CAST XFAST 5X30 (CAST SUPPLIES) ×1 IMPLANT
SPLINT PLASTER XFAST SET 5X30 (CAST SUPPLIES) ×2
SPONGE LAP 18X18 X RAY DECT (DISPOSABLE) ×6 IMPLANT
STAPLER VISISTAT 35W (STAPLE) IMPLANT
STRIP CLOSURE SKIN 1/2X4 (GAUZE/BANDAGES/DRESSINGS) IMPLANT
SUT ETHILON 3 0 PS 1 (SUTURE) ×6 IMPLANT
SUT VIC AB 0 CT1 27 (SUTURE) ×2
SUT VIC AB 0 CT1 27XBRD ANBCTR (SUTURE) ×1 IMPLANT
SUT VIC AB 2-0 CT1 27 (SUTURE) ×2
SUT VIC AB 2-0 CT1 TAPERPNT 27 (SUTURE) ×1 IMPLANT
SYR CONTROL 10ML LL (SYRINGE) IMPLANT
TOWEL OR 17X24 6PK STRL BLUE (TOWEL DISPOSABLE) ×3 IMPLANT
TOWEL OR 17X26 10 PK STRL BLUE (TOWEL DISPOSABLE) ×6 IMPLANT
WASHER 3.5MM (Orthopedic Implant) ×3 IMPLANT

## 2016-01-17 NOTE — Brief Op Note (Signed)
01/17/2016  11:28 PM  PATIENT:  Madison Garza  40 y.o. female  PRE-OPERATIVE DIAGNOSIS:  Open LEFT ELBOW FRACTURE  POST-OPERATIVE DIAGNOSIS:  * No post-op diagnosis entered *  PROCEDURE:  Procedure(s): OPEN REDUCTION INTERNAL FIXATION (ORIF) ELBOW/OLECRANON FRACTURE AND I&D (Left)  SURGEON:  Surgeon(s) and Role:    * Samson FredericBrian Elridge Stemm, MD - Primary  PHYSICIAN ASSISTANT:   ASSISTANTS: brad dixon, pa-c   ANESTHESIA:   general  EBL:  Total I/O In: -  Out: 50 [Blood:50]  BLOOD ADMINISTERED:none  DRAINS: none   LOCAL MEDICATIONS USED:  NONE  SPECIMEN:  No Specimen  DISPOSITION OF SPECIMEN:  N/A  COUNTS:  YES  TOURNIQUET:  * No tourniquets in log *  DICTATION: .Other Dictation: Dictation Number [id number]  PLAN OF CARE: Admit to inpatient   PATIENT DISPOSITION:  PACU - hemodynamically stable.   Delay start of Pharmacological VTE agent (>24hrs) due to surgical blood loss or risk of bleeding: no

## 2016-01-17 NOTE — ED Notes (Signed)
Report called and accepted

## 2016-01-17 NOTE — ED Notes (Signed)
Pt. Asked this RN to call her mother , Britta MccreedyBArbara [phone] and to inform her that pt. Was here in our ED and plan of care.  Spoke with pt.s mother and she is going to try to come and see pt.

## 2016-01-17 NOTE — Discharge Instructions (Signed)
Non weight bearing left arm Keep splint clean and dry. Do not remove.
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
## 2016-01-17 NOTE — Anesthesia Procedure Notes (Signed)
Procedure Name: Intubation Date/Time: 01/17/2016 10:15 PM Performed by: Little IshikawaMERCER, Donnelle Olmeda L Pre-anesthesia Checklist: Patient identified, Emergency Drugs available, Suction available and Patient being monitored Patient Re-evaluated:Patient Re-evaluated prior to inductionOxygen Delivery Method: Circle System Utilized Preoxygenation: Pre-oxygenation with 100% oxygen Intubation Type: IV induction, Rapid sequence and Cricoid Pressure applied Laryngoscope Size: Mac and 3 Grade View: Grade I Tube type: Oral Tube size: 7.5 mm Number of attempts: 1 Airway Equipment and Method: Stylet and Oral airway Placement Confirmation: ETT inserted through vocal cords under direct vision,  positive ETCO2 and breath sounds checked- equal and bilateral Secured at: 21 cm Tube secured with: Tape Dental Injury: Teeth and Oropharynx as per pre-operative assessment
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
## 2016-01-17 NOTE — ED Notes (Signed)
Pt.is on 5 lead on monitor.

## 2016-01-17 NOTE — Progress Notes (Signed)
Report given Vincet, RN

## 2016-01-17 NOTE — Anesthesia Preprocedure Evaluation (Addendum)
Anesthesia Evaluation  Patient identified by MRN, date of birth, ID band Patient awake    Reviewed: Allergy & Precautions, NPO status , Patient's Chart, lab work & pertinent test results  Airway Mallampati: II  TM Distance: >3 FB Neck ROM: Full    Dental no notable dental hx. (+) Dental Advisory Given   Pulmonary Current Smoker,  Small left pneumothorax   Pulmonary exam normal        Cardiovascular negative cardio ROS Normal cardiovascular exam     Neuro/Psych  Headaches, PSYCHIATRIC DISORDERS Anxiety Depression Bipolar Disorder    GI/Hepatic negative GI ROS, (+)     substance abuse  cocaine use,   Endo/Other  negative endocrine ROS  Renal/GU negative Renal ROS     Musculoskeletal Spinous process fx T-3,4,9,10   Abdominal   Peds  Hematology   Anesthesia Other Findings   Reproductive/Obstetrics                            Anesthesia Physical Anesthesia Plan  ASA: III  Anesthesia Plan: General   Post-op Pain Management:    Induction: Intravenous, Rapid sequence and Cricoid pressure planned  Airway Management Planned: Oral ETT  Additional Equipment:   Intra-op Plan:   Post-operative Plan: Possible Post-op intubation/ventilation  Informed Consent: I have reviewed the patients History and Physical, chart, labs and discussed the procedure including the risks, benefits and alternatives for the proposed anesthesia with the patient or authorized representative who has indicated his/her understanding and acceptance.   Dental advisory given  Plan Discussed with: CRNA, Anesthesiologist and Surgeon  Anesthesia Plan Comments:        Anesthesia Quick Evaluation
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
## 2016-01-17 NOTE — ED Notes (Signed)
Patient transported to CT 

## 2016-01-17 NOTE — ED Notes (Signed)
Pt. Just returned from X-ray 

## 2016-01-17 NOTE — Progress Notes (Signed)
Patient c/o generalized pain. Dr. Krista BlueSinger aware.

## 2016-01-17 NOTE — ED Notes (Signed)
Patient transported to CT and xray 

## 2016-01-17 NOTE — Anesthesia Procedure Notes (Signed)
Anesthesia Regional Block:  Supraclavicular block  Pre-Anesthetic Checklist: ,, timeout performed, Correct Patient, Correct Site, Correct Laterality, Correct Procedure,, site marked, risks and benefits discussed, Surgical consent,  Pre-op evaluation,  At surgeon's request and post-op pain management  Laterality: Left  Prep: chloraprep       Needles:  Injection technique: Single-shot  Needle Type: Echogenic Stimulator Needle     Needle Length: 5cm 5 cm Needle Gauge: 22 and 22 G    Additional Needles:  Procedures: ultrasound guided (picture in chart) and nerve stimulator Supraclavicular block  Nerve Stimulator or Paresthesia:  Response: bicep contraction, 0.48 mA,   Additional Responses:   Narrative:  Start time: 01/17/2016 8:03 PM End time: 01/17/2016 8:13 PM Injection made incrementally with aspirations every 5 mL.  Performed by: Personally   Additional Notes: Functioning IV was confirmed and monitors applied.  A 50mm 22ga echogenic arrow stimulator was used. Sterile prep and drape,hand hygiene and sterile gloves were used.Ultrasound guidance: relevent anatomy identified, needle position confirmed, local anesthetic spread visualized around nerve(s)., vascular puncture avoided.  Image printed for medical record.  Negative aspiration and negative test dose prior to incremental administration of local anesthetic. The patient tolerated the procedure well.

## 2016-01-17 NOTE — ED Notes (Signed)
Orders received that pt. Is  Stable to go to X-ray.
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
## 2016-01-17 NOTE — Progress Notes (Signed)
Removed 2 rings from right hand. Given to mother at bedside.

## 2016-01-17 NOTE — Progress Notes (Signed)
Orthopedic Tech Progress Note Patient Details:  Madison Garza [birth year] [id number]  Ortho Devices Type of Ortho Device: Arm sling, Ace wrap, Post (long arm) splint Ortho Device/Splint Interventions: Application   [name] 01/17/2016, 11:02 AM
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
## 2016-01-17 NOTE — Transfer of Care (Signed)
Immediate Anesthesia Transfer of Care Note  Patient: Madison Garza  Procedure(s) Performed: Procedure(s): OPEN REDUCTION INTERNAL FIXATION (ORIF) ELBOW/OLECRANON FRACTURE AND I&D (Left)  Patient Location: PACU  Anesthesia Type:GA combined with regional for post-op pain  Level of Consciousness: sedated and patient cooperative  Airway & Oxygen Therapy: Patient connected to nasal cannula oxygen  Post-op Assessment: Report given to RN and Post -op Vital signs reviewed and stable  Post vital signs: Reviewed and stable  Last Vitals:  Vitals:   01/17/16 2110 01/17/16 2348  BP:  (!) (P) 155/116  Pulse: 91 (P) 83  Resp: 17   Temp:  (P) 36.6 C    Last Pain:  Vitals:   01/17/16 2348  TempSrc:   PainSc: (P) Asleep         Complications: No apparent anesthesia complications

## 2016-01-17 NOTE — ED Notes (Signed)
Mouth care done 

## 2016-01-17 NOTE — ED Notes (Signed)
woud care is done to left elbow.4x4;s wet to dry dress with kelix.
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
## 2016-01-18 ENCOUNTER — Encounter (HOSPITAL_COMMUNITY): Payer: Self-pay | Admitting: Orthopedic Surgery

## 2016-01-18 ENCOUNTER — Inpatient Hospital Stay (HOSPITAL_COMMUNITY): Payer: Medicaid Other

## 2016-01-18 LAB — BASIC METABOLIC PANEL
ANION GAP: 5 (ref 5–15)
BUN: 15 mg/dL (ref 6–20)
CHLORIDE: 105 mmol/L (ref 101–111)
CO2: 25 mmol/L (ref 22–32)
CREATININE: 0.7 mg/dL (ref 0.44–1.00)
Calcium: 8.6 mg/dL — ABNORMAL LOW (ref 8.9–10.3)
GFR calc non Af Amer: >60 mL/min (ref >60)
Glucose, Bld: 140 mg/dL — ABNORMAL HIGH (ref 65–99)
POTASSIUM: 3.6 mmol/L (ref 3.5–5.1)
Sodium: 135 mmol/L (ref 135–145)

## 2016-01-18 LAB — CBC
HEMATOCRIT: 41.3 % (ref 36.0–46.0)
HEMOGLOBIN: 13.1 g/dL (ref 12.0–15.0)
MCH: 29.2 pg (ref 26.0–34.0)
MCHC: 31.7 g/dL (ref 30.0–36.0)
MCV: 92.2 fL (ref 78.0–100.0)
Platelets: 259 10*3/uL (ref 150–400)
RBC: 4.48 MIL/uL (ref 3.87–5.11)
RDW: 14 % (ref 11.5–15.5)
WBC: 6.3 10*3/uL (ref 4.0–10.5)

## 2016-01-18 MED ORDER — HYDROCODONE-ACETAMINOPHEN 5-325 MG PO TABS
1.0000 | ORAL_TABLET | ORAL | Status: DC | PRN
Start: 2016-01-18 — End: 2016-01-19
  Administered 2016-01-18 – 2016-01-19 (×7): 2 via ORAL
  Filled 2016-01-18 (×7): qty 2

## 2016-01-18 MED ORDER — METOCLOPRAMIDE HCL 5 MG PO TABS: 5.0000 mg | ORAL_TABLET | Freq: Three times a day (TID) | ORAL | Status: DC | PRN

## 2016-01-18 MED ORDER — ONDANSETRON HCL 4 MG/2ML IJ SOLN: 4.0000 mg | Freq: Four times a day (QID) | INTRAMUSCULAR | Status: DC | PRN

## 2016-01-18 MED ORDER — KETOROLAC TROMETHAMINE 15 MG/ML IJ SOLN
15.0000 mg | Freq: Four times a day (QID) | INTRAMUSCULAR | Status: AC
  Administered 2016-01-18 (×4): 15 mg via INTRAVENOUS
  Filled 2016-01-18 (×4): qty 1

## 2016-01-18 MED ORDER — ACETAMINOPHEN 325 MG PO TABS: 650.0000 mg | ORAL_TABLET | Freq: Four times a day (QID) | ORAL | Status: DC | PRN

## 2016-01-18 MED ORDER — CEFAZOLIN IN D5W 1 GM/50ML IV SOLN
1.0000 g | Freq: Three times a day (TID) | INTRAVENOUS | Status: AC
  Administered 2016-01-18 – 2016-01-19 (×6): 1 g via INTRAVENOUS
  Filled 2016-01-18 (×7): qty 50

## 2016-01-18 MED ORDER — ALBUTEROL SULFATE (2.5 MG/3ML) 0.083% IN NEBU: 3.0000 mL | INHALATION_SOLUTION | Freq: Four times a day (QID) | RESPIRATORY_TRACT | Status: DC | PRN

## 2016-01-18 MED ORDER — ACETAMINOPHEN 650 MG RE SUPP: 650.0000 mg | Freq: Four times a day (QID) | RECTAL | Status: DC | PRN

## 2016-01-18 MED ORDER — DOCUSATE SODIUM 100 MG PO CAPS
100.0000 mg | ORAL_CAPSULE | Freq: Two times a day (BID) | ORAL | Status: DC
  Administered 2016-01-18: 100 mg via ORAL
  Filled 2016-01-18 (×2): qty 1

## 2016-01-18 MED ORDER — DOCUSATE SODIUM 100 MG PO CAPS: 100.0000 mg | ORAL_CAPSULE | Freq: Two times a day (BID) | ORAL | Status: DC

## 2016-01-18 MED ORDER — METHOCARBAMOL 500 MG PO TABS
500.0000 mg | ORAL_TABLET | Freq: Four times a day (QID) | ORAL | Status: DC | PRN
  Administered 2016-01-18 – 2016-01-20 (×8): 500 mg via ORAL
  Filled 2016-01-18 (×8): qty 1

## 2016-01-18 MED ORDER — HYDROMORPHONE HCL 1 MG/ML IJ SOLN
0.5000 mg | INTRAMUSCULAR | Status: DC | PRN
  Administered 2016-01-18: 0.5 mg via INTRAVENOUS
  Filled 2016-01-18: qty 1

## 2016-01-18 MED ORDER — HYDROMORPHONE HCL 1 MG/ML IJ SOLN
0.5000 mg | INTRAMUSCULAR | Status: DC | PRN
  Administered 2016-01-18 (×6): 0.5 mg via INTRAVENOUS
  Filled 2016-01-18 (×6): qty 1

## 2016-01-18 MED ORDER — METOCLOPRAMIDE HCL 5 MG/ML IJ SOLN: 5.0000 mg | Freq: Three times a day (TID) | INTRAMUSCULAR | Status: DC | PRN

## 2016-01-18 MED ORDER — OXYCODONE HCL 5 MG PO TABS
5.0000 mg | ORAL_TABLET | ORAL | Status: DC | PRN
  Administered 2016-01-18 – 2016-01-19 (×6): 15 mg via ORAL
  Filled 2016-01-18 (×6): qty 3

## 2016-01-18 MED ORDER — POLYETHYLENE GLYCOL 3350 17 G PO PACK
17.0000 g | PACK | Freq: Every day | ORAL | Status: DC
  Administered 2016-01-18: 17 g via ORAL
  Filled 2016-01-18: qty 1

## 2016-01-18 MED ORDER — METHOCARBAMOL 1000 MG/10ML IJ SOLN
500.0000 mg | Freq: Four times a day (QID) | INTRAVENOUS | Status: DC | PRN
  Administered 2016-01-18: 500 mg via INTRAVENOUS
  Filled 2016-01-18 (×3): qty 5

## 2016-01-18 MED ORDER — SENNA 8.6 MG PO TABS
1.0000 | ORAL_TABLET | Freq: Two times a day (BID) | ORAL | Status: DC
  Administered 2016-01-18: 8.6 mg via ORAL
  Filled 2016-01-18 (×2): qty 1

## 2016-01-18 MED ORDER — CEFAZOLIN IN D5W 1 GM/50ML IV SOLN: 1.0000 g | Freq: Three times a day (TID) | INTRAVENOUS | Status: DC

## 2016-01-18 MED ORDER — INFLUENZA VAC SPLIT QUAD 0.5 ML IM SUSY
0.5000 mL | PREFILLED_SYRINGE | INTRAMUSCULAR | Status: AC
  Administered 2016-01-20: 0.5 mL via INTRAMUSCULAR
  Filled 2016-01-18: qty 0.5

## 2016-01-18 MED ORDER — ONDANSETRON HCL 4 MG PO TABS: 4.0000 mg | ORAL_TABLET | Freq: Four times a day (QID) | ORAL | Status: DC | PRN

## 2016-01-18 MED ORDER — HYDROMORPHONE HCL 1 MG/ML IJ SOLN: 0.5000 mg | INTRAMUSCULAR | Status: DC | PRN

## 2016-01-18 MED ORDER — SODIUM CHLORIDE 0.9 % IV SOLN
INTRAVENOUS | Status: DC
  Administered 2016-01-18 (×2): via INTRAVENOUS

## 2016-01-18 MED ORDER — PNEUMOCOCCAL VAC POLYVALENT 25 MCG/0.5ML IJ INJ
0.5000 mL | INJECTION | INTRAMUSCULAR | Status: AC
  Administered 2016-01-20: 0.5 mL via INTRAMUSCULAR
  Filled 2016-01-18: qty 0.5

## 2016-01-18 MED ORDER — ENOXAPARIN SODIUM 40 MG/0.4ML ~~LOC~~ SOLN
40.0000 mg | SUBCUTANEOUS | Status: DC
  Administered 2016-01-18: 40 mg via SUBCUTANEOUS
  Filled 2016-01-18: qty 0.4

## 2016-01-18 MED ORDER — ENOXAPARIN SODIUM 40 MG/0.4ML ~~LOC~~ SOLN: 40.0000 mg | SUBCUTANEOUS | Status: DC
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
## 2016-01-18 NOTE — Anesthesia Postprocedure Evaluation (Signed)
Anesthesia Post Note  Patient: Madison Garza  Procedure(s) Performed: Procedure(s) (LRB): OPEN REDUCTION INTERNAL FIXATION (ORIF) ELBOW/OLECRANON FRACTURE AND I&D (Left)  Patient location during evaluation: PACU Anesthesia Type: General Level of consciousness: sedated Pain management: pain level controlled Vital Signs Assessment: post-procedure vital signs reviewed and stable Respiratory status: spontaneous breathing and respiratory function stable Cardiovascular status: stable Anesthetic complications: no    Last Vitals:  Vitals:   01/18/16 0045 01/18/16 0125  BP: (!) 148/106 (!) 149/107  Pulse:  91  Resp:  15  Temp: 36.9 C 36.5 C    Last Pain:  Vitals:   01/18/16 0431  TempSrc:   PainSc: Asleep                 Paulmichael Schreck DANIEL
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
## 2016-01-19 DIAGNOSIS — S060X9A Concussion with loss of consciousness of unspecified duration, initial encounter: Secondary | ICD-10-CM | POA: Diagnosis present

## 2016-01-19 DIAGNOSIS — S2232XA Fracture of one rib, left side, initial encounter for closed fracture: Secondary | ICD-10-CM | POA: Diagnosis present

## 2016-01-19 DIAGNOSIS — S22009A Unspecified fracture of unspecified thoracic vertebra, initial encounter for closed fracture: Secondary | ICD-10-CM | POA: Diagnosis present

## 2016-01-19 DIAGNOSIS — S060XAA Concussion with loss of consciousness status unknown, initial encounter: Secondary | ICD-10-CM | POA: Diagnosis present

## 2016-01-19 MED ORDER — HYDROMORPHONE HCL 1 MG/ML IJ SOLN
0.5000 mg | INTRAMUSCULAR | Status: DC | PRN
  Administered 2016-01-19 – 2016-01-21 (×8): 0.5 mg via INTRAVENOUS
  Filled 2016-01-19 (×9): qty 1

## 2016-01-19 MED ORDER — OXYCODONE HCL 5 MG PO TABS
10.0000 mg | ORAL_TABLET | ORAL | Status: DC | PRN
  Administered 2016-01-19 – 2016-01-21 (×10): 20 mg via ORAL
  Filled 2016-01-19 (×10): qty 4

## 2016-01-19 MED ORDER — WHITE PETROLATUM GEL
Status: AC
  Administered 2016-01-19: 16:00:00
  Filled 2016-01-19: qty 1

## 2016-01-19 MED ORDER — ENOXAPARIN SODIUM 30 MG/0.3ML ~~LOC~~ SOLN
30.0000 mg | SUBCUTANEOUS | Status: DC
  Administered 2016-01-19 – 2016-01-20 (×2): 30 mg via SUBCUTANEOUS
  Filled 2016-01-19 (×2): qty 0.3

## 2016-01-19 MED ORDER — TRAMADOL HCL 50 MG PO TABS
100.0000 mg | ORAL_TABLET | Freq: Four times a day (QID) | ORAL | Status: DC
  Administered 2016-01-19 – 2016-01-20 (×6): 100 mg via ORAL
  Filled 2016-01-19 (×7): qty 2

## 2016-01-19 NOTE — Clinical Social Work Note (Signed)
CSW met with pt and pt's mother at bedside. Pt and pt's mother were requesting further information reguarding options for resources available to the patient following discharge. Pt's mother was specifically concerned about housing, safety, and substance abuse treatment following discharge. Pt was concerned about housing following discharge. CSW provided pt and pt's mother information regarding the [facility] [place]. CSW explained the services and resources the facility could offer the pt. CSW got the sense that the pt wanted to make the decision more independently. Pt and pt's mother agreed to discuss and inquire further information by calling [facility] [place]. CSW will continue to support pt and facilitate any needs through discharge.   [address], [place]
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
## 2016-01-20 ENCOUNTER — Inpatient Hospital Stay (HOSPITAL_COMMUNITY): Payer: Medicaid Other

## 2016-01-20 MED ORDER — OXYCODONE HCL ER 10 MG PO T12A
10.0000 mg | EXTENDED_RELEASE_TABLET | Freq: Two times a day (BID) | ORAL | Status: DC
  Administered 2016-01-20 (×2): 10 mg via ORAL
  Filled 2016-01-20 (×2): qty 1
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
## 2016-01-21 MED ORDER — OXYCODONE HCL ER 10 MG PO T12A
20.0000 mg | EXTENDED_RELEASE_TABLET | Freq: Two times a day (BID) | ORAL | Status: DC
  Administered 2016-01-21: 20 mg via ORAL
  Filled 2016-01-21: qty 2

## 2016-01-21 MED ORDER — OXYCODONE-ACETAMINOPHEN 10-325 MG PO TABS: 1.0000 | ORAL_TABLET | ORAL | 0 refills | Status: DC | PRN

## 2016-01-21 MED ORDER — METHOCARBAMOL 500 MG PO TABS: 1000.0000 mg | ORAL_TABLET | Freq: Four times a day (QID) | ORAL | 0 refills | Status: DC | PRN

## 2016-01-21 MED ORDER — OXYCODONE HCL ER 10 MG PO T12A: EXTENDED_RELEASE_TABLET | ORAL | 0 refills | Status: DC

## 2016-01-21 MED ORDER — METHOCARBAMOL 500 MG PO TABS
1000.0000 mg | ORAL_TABLET | Freq: Four times a day (QID) | ORAL | Status: DC
  Administered 2016-01-21 (×2): 1000 mg via ORAL
  Filled 2016-01-21 (×2): qty 2
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
## 2016-02-03 ENCOUNTER — Encounter (HOSPITAL_COMMUNITY): Payer: Self-pay | Admitting: Emergency Medicine

## 2016-02-03 ENCOUNTER — Emergency Department (HOSPITAL_COMMUNITY)
Admission: EM | Admit: 2016-02-03 | Discharge: 2016-02-04 | Disposition: A | Discharge status: Home / Self Care | Payer: Medicaid Other | Attending: Emergency Medicine | Admitting: Emergency Medicine

## 2016-02-03 ENCOUNTER — Emergency Department (HOSPITAL_COMMUNITY): Payer: Medicaid Other

## 2016-02-03 DIAGNOSIS — T401X1A Poisoning by heroin, accidental (unintentional), initial encounter: Secondary | ICD-10-CM | POA: Insufficient documentation

## 2016-02-03 DIAGNOSIS — Z79899 Other long term (current) drug therapy: Secondary | ICD-10-CM | POA: Insufficient documentation

## 2016-02-03 DIAGNOSIS — Z7982 Long term (current) use of aspirin: Secondary | ICD-10-CM | POA: Insufficient documentation

## 2016-02-03 DIAGNOSIS — F17200 Nicotine dependence, unspecified, uncomplicated: Secondary | ICD-10-CM | POA: Insufficient documentation

## 2016-02-03 DIAGNOSIS — G8918 Other acute postprocedural pain: Secondary | ICD-10-CM | POA: Insufficient documentation

## 2016-02-03 DIAGNOSIS — M25522 Pain in left elbow: Secondary | ICD-10-CM | POA: Insufficient documentation

## 2016-02-03 NOTE — ED Notes (Signed)
In XRAY  
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
## 2016-02-03 NOTE — ED Notes (Signed)
Bed: RESA Expected date:  Expected time:  Means of arrival:  Comments: EMS heroin overdose 

## 2016-02-03 NOTE — Discharge Instructions (Signed)
Take your medications as prescribed to help with postoperative pain.

## 2016-06-28 ENCOUNTER — Emergency Department (HOSPITAL_COMMUNITY)
Admission: EM | Admit: 2016-06-28 | Discharge: 2016-06-28 | Disposition: A | Discharge status: Home / Self Care | Payer: Self-pay | Attending: Emergency Medicine | Admitting: Emergency Medicine

## 2016-06-28 DIAGNOSIS — F1110 Opioid abuse, uncomplicated: Secondary | ICD-10-CM | POA: Insufficient documentation

## 2016-06-28 DIAGNOSIS — F1410 Cocaine abuse, uncomplicated: Secondary | ICD-10-CM | POA: Insufficient documentation

## 2016-06-28 DIAGNOSIS — F1910 Other psychoactive substance abuse, uncomplicated: Secondary | ICD-10-CM

## 2016-06-28 DIAGNOSIS — F1210 Cannabis abuse, uncomplicated: Secondary | ICD-10-CM | POA: Insufficient documentation

## 2016-06-28 DIAGNOSIS — T401X1A Poisoning by heroin, accidental (unintentional), initial encounter: Secondary | ICD-10-CM | POA: Insufficient documentation

## 2016-06-28 LAB — URINALYSIS, ROUTINE W REFLEX MICROSCOPIC
Bilirubin Urine: NEGATIVE
Glucose, UA: 50 mg/dL — AB
Hgb urine dipstick: NEGATIVE
Ketones, ur: NEGATIVE mg/dL
Leukocytes, UA: NEGATIVE
Nitrite: NEGATIVE
Protein, ur: >=300 mg/dL — AB
Specific Gravity, Urine: 1.022 (ref 1.005–1.030)
Squamous Epithelial / HPF: NONE SEEN
pH: 5 (ref 5.0–8.0)

## 2016-06-28 LAB — CBC
HCT: 39.8 % (ref 36.0–46.0)
Hemoglobin: 13.2 g/dL (ref 12.0–15.0)
MCH: 29.5 pg (ref 26.0–34.0)
MCHC: 33.2 g/dL (ref 30.0–36.0)
MCV: 89 fL (ref 78.0–100.0)
PLATELETS: 278 10*3/uL (ref 150–400)
RBC: 4.47 MIL/uL (ref 3.87–5.11)
RDW: 12.8 % (ref 11.5–15.5)
WBC: 6.6 10*3/uL (ref 4.0–10.5)

## 2016-06-28 LAB — COMPREHENSIVE METABOLIC PANEL
ALT: 55 U/L — ABNORMAL HIGH (ref 14–54)
ANION GAP: 7 (ref 5–15)
AST: 43 U/L — ABNORMAL HIGH (ref 15–41)
Albumin: 3.5 g/dL (ref 3.5–5.0)
Alkaline Phosphatase: 91 U/L (ref 38–126)
BUN: 8 mg/dL (ref 6–20)
CO2: 24 mmol/L (ref 22–32)
Calcium: 8 mg/dL — ABNORMAL LOW (ref 8.9–10.3)
Chloride: 107 mmol/L (ref 101–111)
Creatinine, Ser: 0.58 mg/dL (ref 0.44–1.00)
Glucose, Bld: 129 mg/dL — ABNORMAL HIGH (ref 65–99)
POTASSIUM: 3.1 mmol/L — AB (ref 3.5–5.1)
Sodium: 138 mmol/L (ref 135–145)
Total Bilirubin: 0.3 mg/dL (ref 0.3–1.2)
Total Protein: 7.2 g/dL (ref 6.5–8.1)

## 2016-06-28 LAB — RAPID URINE DRUG SCREEN, HOSP PERFORMED
AMPHETAMINES: NOT DETECTED
Barbiturates: NOT DETECTED
Benzodiazepines: NOT DETECTED
Cocaine: POSITIVE — AB
Opiates: POSITIVE — AB
TETRAHYDROCANNABINOL: POSITIVE — AB

## 2016-06-28 LAB — PREGNANCY, URINE: PREG TEST UR: NEGATIVE

## 2016-06-28 LAB — CBG MONITORING, ED: Glucose-Capillary: 148 mg/dL — ABNORMAL HIGH (ref 65–99)

## 2016-06-28 LAB — ETHANOL

## 2016-06-28 LAB — ACETAMINOPHEN LEVEL

## 2016-06-28 LAB — SALICYLATE LEVEL

## 2016-06-28 MED ORDER — SODIUM CHLORIDE 0.9 % IV BOLUS (SEPSIS)
1000.0000 mL | Freq: Once | INTRAVENOUS | Status: AC
  Administered 2016-06-28: 1000 mL via INTRAVENOUS

## 2016-06-28 NOTE — ED Notes (Signed)
Pt ambulated to the bathroom with no assistance.

## 2016-06-28 NOTE — ED Notes (Signed)
Pt sts will wait in the lobby for her ride .

## 2016-06-28 NOTE — ED Notes (Signed)
Pt ambulated to restroom with steady gait. Alert and oriented x 4.
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
## 2016-07-22 ENCOUNTER — Inpatient Hospital Stay (HOSPITAL_COMMUNITY)
Admission: EM | Admit: 2016-07-22 | Discharge: 2016-07-24 | DRG: 918 | Disposition: A | Discharge status: Home / Self Care | Payer: Medicaid Other | Attending: Oncology | Admitting: Oncology

## 2016-07-22 ENCOUNTER — Encounter (HOSPITAL_COMMUNITY): Payer: Self-pay

## 2016-07-22 ENCOUNTER — Emergency Department (HOSPITAL_COMMUNITY): Payer: Medicaid Other

## 2016-07-22 DIAGNOSIS — F1010 Alcohol abuse, uncomplicated: Secondary | ICD-10-CM | POA: Diagnosis present

## 2016-07-22 DIAGNOSIS — R768 Other specified abnormal immunological findings in serum: Secondary | ICD-10-CM

## 2016-07-22 DIAGNOSIS — R740 Nonspecific elevation of levels of transaminase and lactic acid dehydrogenase [LDH]: Secondary | ICD-10-CM | POA: Diagnosis present

## 2016-07-22 DIAGNOSIS — R4182 Altered mental status, unspecified: Secondary | ICD-10-CM | POA: Diagnosis present

## 2016-07-22 DIAGNOSIS — E876 Hypokalemia: Secondary | ICD-10-CM | POA: Diagnosis present

## 2016-07-22 DIAGNOSIS — E872 Acidosis: Secondary | ICD-10-CM | POA: Diagnosis present

## 2016-07-22 DIAGNOSIS — R451 Restlessness and agitation: Secondary | ICD-10-CM | POA: Diagnosis present

## 2016-07-22 DIAGNOSIS — L02412 Cutaneous abscess of left axilla: Secondary | ICD-10-CM | POA: Diagnosis present

## 2016-07-22 DIAGNOSIS — F15129 Other stimulant abuse with intoxication, unspecified: Secondary | ICD-10-CM | POA: Diagnosis present

## 2016-07-22 DIAGNOSIS — F19929 Other psychoactive substance use, unspecified with intoxication, unspecified: Secondary | ICD-10-CM | POA: Diagnosis present

## 2016-07-22 DIAGNOSIS — R61 Generalized hyperhidrosis: Secondary | ICD-10-CM | POA: Diagnosis present

## 2016-07-22 DIAGNOSIS — R509 Fever, unspecified: Secondary | ICD-10-CM

## 2016-07-22 DIAGNOSIS — F1910 Other psychoactive substance abuse, uncomplicated: Secondary | ICD-10-CM | POA: Diagnosis present

## 2016-07-22 DIAGNOSIS — B1920 Unspecified viral hepatitis C without hepatic coma: Secondary | ICD-10-CM | POA: Diagnosis present

## 2016-07-22 DIAGNOSIS — F1490 Cocaine use, unspecified, uncomplicated: Secondary | ICD-10-CM | POA: Diagnosis present

## 2016-07-22 DIAGNOSIS — H6592 Unspecified nonsuppurative otitis media, left ear: Secondary | ICD-10-CM | POA: Diagnosis present

## 2016-07-22 DIAGNOSIS — R339 Retention of urine, unspecified: Secondary | ICD-10-CM | POA: Diagnosis present

## 2016-07-22 DIAGNOSIS — T43621A Poisoning by amphetamines, accidental (unintentional), initial encounter: Principal | ICD-10-CM | POA: Diagnosis present

## 2016-07-22 DIAGNOSIS — K7589 Other specified inflammatory liver diseases: Secondary | ICD-10-CM

## 2016-07-22 DIAGNOSIS — F17210 Nicotine dependence, cigarettes, uncomplicated: Secondary | ICD-10-CM | POA: Diagnosis present

## 2016-07-22 DIAGNOSIS — F1410 Cocaine abuse, uncomplicated: Secondary | ICD-10-CM

## 2016-07-22 DIAGNOSIS — F319 Bipolar disorder, unspecified: Secondary | ICD-10-CM | POA: Diagnosis present

## 2016-07-22 DIAGNOSIS — R51 Headache: Secondary | ICD-10-CM | POA: Diagnosis present

## 2016-07-22 LAB — CK: Total CK: 80 U/L (ref 38–234)

## 2016-07-22 LAB — URINALYSIS, ROUTINE W REFLEX MICROSCOPIC
Bilirubin Urine: NEGATIVE
GLUCOSE, UA: NEGATIVE mg/dL
Hgb urine dipstick: NEGATIVE
KETONES UR: NEGATIVE mg/dL
LEUKOCYTES UA: NEGATIVE
Nitrite: NEGATIVE
PH: 6 (ref 5.0–8.0)
Protein, ur: NEGATIVE mg/dL
Specific Gravity, Urine: 1.004 — ABNORMAL LOW (ref 1.005–1.030)

## 2016-07-22 LAB — RAPID URINE DRUG SCREEN, HOSP PERFORMED
AMPHETAMINES: NOT DETECTED
BARBITURATES: NOT DETECTED
BENZODIAZEPINES: POSITIVE — AB
Cocaine: POSITIVE — AB
Opiates: NOT DETECTED
Tetrahydrocannabinol: NOT DETECTED

## 2016-07-22 LAB — COMPREHENSIVE METABOLIC PANEL
ALT: 212 U/L — ABNORMAL HIGH (ref 14–54)
ANION GAP: 13 (ref 5–15)
AST: 306 U/L — ABNORMAL HIGH (ref 15–41)
Albumin: 3 g/dL — ABNORMAL LOW (ref 3.5–5.0)
Alkaline Phosphatase: 302 U/L — ABNORMAL HIGH (ref 38–126)
BILIRUBIN TOTAL: 1.3 mg/dL — AB (ref 0.3–1.2)
BUN: 15 mg/dL (ref 6–20)
CALCIUM: 8.8 mg/dL — AB (ref 8.9–10.3)
CO2: 22 mmol/L (ref 22–32)
CREATININE: 0.89 mg/dL (ref 0.44–1.00)
Chloride: 100 mmol/L — ABNORMAL LOW (ref 101–111)
GFR calc Af Amer: >60 mL/min (ref >60)
GFR calc non Af Amer: >60 mL/min (ref >60)
GLUCOSE: 103 mg/dL — AB (ref 65–99)
Potassium: 3.2 mmol/L — ABNORMAL LOW (ref 3.5–5.1)
Sodium: 135 mmol/L (ref 135–145)
TOTAL PROTEIN: 6.9 g/dL (ref 6.5–8.1)

## 2016-07-22 LAB — BASIC METABOLIC PANEL
Anion gap: 12 (ref 5–15)
BUN: 16 mg/dL (ref 6–20)
CHLORIDE: 106 mmol/L (ref 101–111)
CO2: 21 mmol/L — AB (ref 22–32)
CREATININE: 0.82 mg/dL (ref 0.44–1.00)
Calcium: 7.6 mg/dL — ABNORMAL LOW (ref 8.9–10.3)
GFR calc Af Amer: >60 mL/min (ref >60)
GFR calc non Af Amer: >60 mL/min (ref >60)
GLUCOSE: 109 mg/dL — AB (ref 65–99)
Potassium: 3.6 mmol/L (ref 3.5–5.1)
Sodium: 139 mmol/L (ref 135–145)

## 2016-07-22 LAB — ETHANOL

## 2016-07-22 LAB — GLUCOSE, CAPILLARY
GLUCOSE-CAPILLARY: 91 mg/dL (ref 65–99)
GLUCOSE-CAPILLARY: 90 mg/dL (ref 65–99)

## 2016-07-22 LAB — CBC WITH DIFFERENTIAL/PLATELET
BASOS PCT: 0 %
Basophils Absolute: 0 10*3/uL (ref 0.0–0.1)
Eosinophils Absolute: 0 10*3/uL (ref 0.0–0.7)
Eosinophils Relative: 0 %
HEMATOCRIT: 42.3 % (ref 36.0–46.0)
HEMOGLOBIN: 13.9 g/dL (ref 12.0–15.0)
LYMPHS ABS: 0.3 10*3/uL — AB (ref 0.7–4.0)
LYMPHS PCT: 4 %
MCH: 29.1 pg (ref 26.0–34.0)
MCHC: 32.9 g/dL (ref 30.0–36.0)
MCV: 88.5 fL (ref 78.0–100.0)
MONO ABS: 0.1 10*3/uL (ref 0.1–1.0)
MONOS PCT: 2 %
NEUTROS ABS: 6.6 10*3/uL (ref 1.7–7.7)
Neutrophils Relative %: 94 %
Platelets: 172 10*3/uL (ref 150–400)
RBC: 4.78 MIL/uL (ref 3.87–5.11)
RDW: 13.5 % (ref 11.5–15.5)
WBC: 7 10*3/uL (ref 4.0–10.5)

## 2016-07-22 LAB — PROTIME-INR
INR: 1.22
PROTHROMBIN TIME: 15.4 s — AB (ref 11.4–15.2)

## 2016-07-22 LAB — I-STAT CG4 LACTIC ACID, ED: LACTIC ACID, VENOUS: 3.23 mmol/L — AB (ref 0.5–1.9)

## 2016-07-22 LAB — PREGNANCY, URINE: PREG TEST UR: NEGATIVE

## 2016-07-22 LAB — CBG MONITORING, ED: Glucose-Capillary: 117 mg/dL — ABNORMAL HIGH (ref 65–99)

## 2016-07-22 LAB — APTT: APTT: 31 s (ref 24–36)

## 2016-07-22 LAB — PROCALCITONIN: Procalcitonin: 18.61 ng/mL

## 2016-07-22 LAB — HIV ANTIBODY (ROUTINE TESTING W REFLEX): HIV Screen 4th Generation wRfx: NONREACTIVE

## 2016-07-22 MED ORDER — LORAZEPAM 2 MG/ML IJ SOLN
1.0000 mg | Freq: Four times a day (QID) | INTRAMUSCULAR | Status: DC | PRN
Start: 2016-07-22 — End: 2016-07-24

## 2016-07-22 MED ORDER — LORAZEPAM 1 MG PO TABS: 1.0000 mg | ORAL_TABLET | Freq: Four times a day (QID) | ORAL | Status: DC | PRN

## 2016-07-22 MED ORDER — KETOROLAC TROMETHAMINE 30 MG/ML IJ SOLN
30.0000 mg | Freq: Once | INTRAMUSCULAR | Status: AC
  Administered 2016-07-22: 30 mg via INTRAVENOUS
  Filled 2016-07-22: qty 1

## 2016-07-22 MED ORDER — POTASSIUM CHLORIDE IN NACL 40-0.9 MEQ/L-% IV SOLN
INTRAVENOUS | Status: AC
  Administered 2016-07-22 (×2): 100 mL/h via INTRAVENOUS
  Filled 2016-07-22 (×2): qty 1000

## 2016-07-22 MED ORDER — SODIUM CHLORIDE 0.9% FLUSH: 3.0000 mL | Freq: Two times a day (BID) | INTRAVENOUS | Status: DC

## 2016-07-22 MED ORDER — ACETAMINOPHEN 325 MG PO TABS: 325.0000 mg | ORAL_TABLET | Freq: Once | ORAL | Status: DC

## 2016-07-22 MED ORDER — VANCOMYCIN HCL IN DEXTROSE 750-5 MG/150ML-% IV SOLN
750.0000 mg | Freq: Three times a day (TID) | INTRAVENOUS | Status: DC
  Administered 2016-07-22 – 2016-07-24 (×6): 750 mg via INTRAVENOUS
  Filled 2016-07-22 (×7): qty 150

## 2016-07-22 MED ORDER — FOLIC ACID 1 MG PO TABS
1.0000 mg | ORAL_TABLET | Freq: Every day | ORAL | Status: DC
  Administered 2016-07-22 – 2016-07-24 (×3): 1 mg via ORAL
  Filled 2016-07-22 (×3): qty 1

## 2016-07-22 MED ORDER — ENOXAPARIN SODIUM 40 MG/0.4ML ~~LOC~~ SOLN
40.0000 mg | SUBCUTANEOUS | Status: DC
  Administered 2016-07-22 – 2016-07-23 (×2): 40 mg via SUBCUTANEOUS
  Filled 2016-07-22 (×4): qty 0.4

## 2016-07-22 MED ORDER — NALOXONE HCL 0.4 MG/ML IJ SOLN
INTRAMUSCULAR | Status: AC
  Administered 2016-07-22: 0.4 mg
  Filled 2016-07-22: qty 1

## 2016-07-22 MED ORDER — ADULT MULTIVITAMIN W/MINERALS CH
1.0000 | ORAL_TABLET | Freq: Every day | ORAL | Status: DC
  Administered 2016-07-22 – 2016-07-24 (×3): 1 via ORAL
  Filled 2016-07-22 (×3): qty 1

## 2016-07-22 MED ORDER — SODIUM CHLORIDE 0.9 % IV BOLUS (SEPSIS)
1000.0000 mL | Freq: Once | INTRAVENOUS | Status: AC
Start: 2016-07-22 — End: 2016-07-22
  Administered 2016-07-22: 1000 mL via INTRAVENOUS

## 2016-07-22 MED ORDER — VITAMIN B-1 100 MG PO TABS
100.0000 mg | ORAL_TABLET | Freq: Every day | ORAL | Status: DC
  Administered 2016-07-23 – 2016-07-24 (×2): 100 mg via ORAL
  Filled 2016-07-22 (×2): qty 1

## 2016-07-22 MED ORDER — ONDANSETRON HCL 4 MG/2ML IJ SOLN
4.0000 mg | Freq: Once | INTRAMUSCULAR | Status: AC
  Administered 2016-07-22: 4 mg via INTRAVENOUS
  Filled 2016-07-22: qty 2

## 2016-07-22 MED ORDER — VANCOMYCIN HCL IN DEXTROSE 1-5 GM/200ML-% IV SOLN
1000.0000 mg | Freq: Once | INTRAVENOUS | Status: AC
  Administered 2016-07-22: 1000 mg via INTRAVENOUS
  Filled 2016-07-22: qty 200

## 2016-07-22 MED ORDER — THIAMINE HCL 100 MG/ML IJ SOLN
100.0000 mg | Freq: Every day | INTRAMUSCULAR | Status: DC
  Administered 2016-07-22: 100 mg via INTRAVENOUS
  Filled 2016-07-22: qty 2

## 2016-07-22 MED ORDER — ACETAMINOPHEN 325 MG PO TABS
650.0000 mg | ORAL_TABLET | Freq: Once | ORAL | Status: AC
  Administered 2016-07-22: 650 mg via ORAL
  Filled 2016-07-22: qty 2

## 2016-07-22 NOTE — Progress Notes (Signed)
Pharmacy Antibiotic Note  Madison Garza is a 41 y.o. female admitted on 07/22/2016 with fever and left axilla abscess 2/2 to IV drug use.  Pharmacy has been consulted for Vancomycin dosing.  Plan: Vancomycin  once then  IV every 8 hours.  Goal trough 15-20 mcg/mL. F/U C/S & LOT  Height:  (160 cm) Weight: 112 lb (50.8 kg) IBW/kg (Calculated) : 52.4  Temp (24hrs), Avg:100.7 F (38.2 C), Min:100.3 F (37.9 C), Max:101 F (38.3 C)   Recent Labs Lab 07/22/16 0505 07/22/16 0517  WBC 7.0  --   CREATININE 0.89  --   LATICACIDVEN  --  3.23*    Estimated Creatinine Clearance: 66.7 mL/min (by C-G formula based on SCr of 0.89 mg/dL).    No Known Allergies  Thank you for allowing pharmacy to be a part of this patient's care.  Toniann Fail Reianna Batdorf 07/22/2016 8:08 AM

## 2016-07-22 NOTE — H&P (Signed)
Date: 07/22/2016               Patient Name:  Madison Garza MRN: [id number]  DOB: 03-16-1976 Age / Sex: 41 y.o., female   PCP: Pcp Not In System         Medical Service: Internal Medicine Teaching Service         Attending Physician: Dr. Levert Feinstein, MD    First Contact: Dr. Antony Contras Pager: [phone]  Second Contact: Dr. Allena Katz  Pager: [phone]       After Hours (After 5p/  First Contact Pager: [phone]  weekends / holidays): Second Contact Pager: [phone]   Chief Complaint: Fever, AMS  History of Present Illness: Patient is a 41 year old female past medical history of polysubstance abuse here with altered mental status and fever. Per report, patient was dropped off at the ED by a friend. The patient complained of "hurting all over" and admitted to IV drugs, specifically injecting molly yesterday evening. On admission evaluation, patient is difficult to arouse. Responsive to sternal rub but quickly falls back asleep. She is unable to provide any further history.  On arrival to the ED, patient was febrile to 101 and hemodynamically stable with blood pressure 107/84, heart rate 114, respiratory rate 26, satting 95% on room air. CBC was unremarkable. CMP was notable for hypokalemia (3.2) and a transaminitis (AST 36, AST 212, alkaline phosphatase 202, T bili 1.3). Lactic acid was elevated 2.23. Blood alcohol level was undetectable. Urinalysis was unremarkable. UDS was positive for benzodiazepines and amphetamines. Chest x-ray was normal. She was started on IV vancomycin. She was given 0.4 mg of Narcan with no improvement in her mental status.   Meds:  No outpatient prescriptions have been marked as taking for the 07/22/16 encounter [facility] Encounter).     Allergies: Allergies as of 07/22/2016  . (No Known Allergies)   Past Medical History:  Diagnosis Date  . Alcohol abuse   . Bipolar 1 disorder (HCC)   . Depression   . Drug abuse   . Headache(784.0)     Family History:  Unable to obtain  Social History: Unable to obtain  Review of Systems: A complete ROS was negative except as per HPI.   Physical Exam: Blood pressure (!) 124/92, pulse (!) 112, temperature 100.3 F (37.9 C), temperature source Oral, resp. rate (!) 23, height  (1.6 m), weight 112 lb (50.8 kg), last menstrual period 07/22/2013, SpO2 100 %. Constitutional: Diaphoretic, altered HEENT: Atraumatic, normocephalic. Pupils constricted and minimally reactive to light.  Neck: Supple, trachea midline.  Cardiovascular: Ausculation limited due to snoring, but RRR, no m/r/g  Pulmonary/Chest: Transmission of upper airway secretions, snoring loudly, otherwise CTAB Abdominal: Soft, non tender, non distended. +BS.  GU: Normal external genitalia  Extremities: Warm and well perfused. Distal pulses intact. Small sub centimeter scab with surrounding erythema on the dorsum of her L foot. 1 cm patch of cellulitis under left axilla - no appreciable fluid collection.  Neurological: Altered, somnolent. Opens eyes to sternal rub but quickly falls back asleep. Does not respond to voice. Skin: No rashes or erythema  Psychiatric: Normal mood and affect   EKG: Pending.  CXR: Personally reviewed, agree with report. Normal CXR.   Assessment & Plan by Problem:  Patient is a 41 year old female past medical history of polysubstance abuse presenting with altered mental status and fever after IV drug use yesterday evening.  Febrile Illness: Per report, patient injected Molly yesterday evening. UDS is positive for  benzodiazepines and cocaine. IV vancomycin started per ED provider, patient is currently hemodynamically stable and CBC is without leukocytosis. Chest x-ray and UA are normal. Blood cultures are pending. No evidence of Janeway lesions, Osler's nodes, or Roth spots on exam. Hyperthermia is also commonly seen with MDMA intoxication. Will continue to monitor closely.  -- Continue IV vanc for now  -- F/u  procalcitonin; if low, consider de-escalating abx  -- F/u Blood cultures   AMS: Patient is altered, diaphoretic, and febrile likely due to acute MDMA intoxication with hepatic injury. Reportedly injected molly last night, UDS also positive for benzos and cocaine.  No improvement in mental status with 0.4 mg of Narcan. Sodium currently stable. Will repeat BMP this afternoon. Continue to monitor for other complications of MDMA intoxication such as seizure, serotonin syndrome, and rhabdomyolysis. If no improvement in mental status over the next 12-24 hours, will obtain LP for further work up.  -- NS 100 cc/hr with K -- BMP this afternoon  -- CK, urine myoglobin  -- PT/INR -- EKG pending  -- Seizure precautions  -- CIWA protocol   Transaminitis: AST 36, AST 212, alkaline phosphatase 202, T bili 1.3 in the setting of IV drug use. Hepatotoxicity caused by MDMA abuse is well recognized. Will also rule out other causes of hepatitis. -- Hep B, C antibodies  -- HIV antibodies  -- Repeat AM CMP   FEN: NS 100 cc/hr with K, replete lytes prn, NPO while altered  VTE ppx: Lovenox  Code Status: FULL   Dispo: Admit patient to Observation with expected length of stay less than 2 midnights.  Signed: Reymundo Poll, MD 07/22/2016, 7:45 AM  Pager: [phone]

## 2016-07-22 NOTE — ED Triage Notes (Signed)
Pt arrived via POV "dropped off by a friend" states she last used molly last night. Denies heroin use recently. Blood coming from left ear.

## 2016-07-22 NOTE — ED Notes (Signed)
Delay in 2nd set of blood culture pt in xray.

## 2016-07-22 NOTE — ED Notes (Signed)
Admitting at bedside 

## 2016-07-22 NOTE — Progress Notes (Signed)
07/22/16 Pt bladder scanned with 999 cc in bladder, MD notified and verbal order w/readback  for a one time straight cath given. Pt Straight cath placed and 950 cc clear yellow urine drained from bladder. Catheter removed after procedure
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
## 2016-07-23 DIAGNOSIS — H65192 Other acute nonsuppurative otitis media, left ear: Secondary | ICD-10-CM

## 2016-07-23 DIAGNOSIS — R945 Abnormal results of liver function studies: Secondary | ICD-10-CM

## 2016-07-23 DIAGNOSIS — R339 Retention of urine, unspecified: Secondary | ICD-10-CM

## 2016-07-23 LAB — HEPATITIS B SURFACE ANTIBODY,QUALITATIVE: HEP B S AB: NONREACTIVE

## 2016-07-23 LAB — COMPREHENSIVE METABOLIC PANEL
ALT: 158 U/L — AB (ref 14–54)
ANION GAP: 6 (ref 5–15)
AST: 118 U/L — ABNORMAL HIGH (ref 15–41)
Albumin: 2.3 g/dL — ABNORMAL LOW (ref 3.5–5.0)
Alkaline Phosphatase: 149 U/L — ABNORMAL HIGH (ref 38–126)
BUN: 11 mg/dL (ref 6–20)
CALCIUM: 8 mg/dL — AB (ref 8.9–10.3)
CO2: 22 mmol/L (ref 22–32)
CREATININE: 0.59 mg/dL (ref 0.44–1.00)
Chloride: 110 mmol/L (ref 101–111)
GFR calc non Af Amer: >60 mL/min (ref >60)
GLUCOSE: 141 mg/dL — AB (ref 65–99)
Potassium: 3.9 mmol/L (ref 3.5–5.1)
SODIUM: 138 mmol/L (ref 135–145)
Total Bilirubin: 0.5 mg/dL (ref 0.3–1.2)
Total Protein: 6 g/dL — ABNORMAL LOW (ref 6.5–8.1)

## 2016-07-23 LAB — APTT: aPTT: 43 seconds — ABNORMAL HIGH (ref 24–36)

## 2016-07-23 LAB — HEPATITIS C ANTIBODY (REFLEX): HCV Ab: >11 s/co ratio — ABNORMAL HIGH (ref 0.0–0.9)

## 2016-07-23 LAB — CBC
HCT: 40.1 % (ref 36.0–46.0)
Hemoglobin: 12.9 g/dL (ref 12.0–15.0)
MCH: 28.8 pg (ref 26.0–34.0)
MCHC: 32.2 g/dL (ref 30.0–36.0)
MCV: 89.5 fL (ref 78.0–100.0)
PLATELETS: 192 10*3/uL (ref 150–400)
RBC: 4.48 MIL/uL (ref 3.87–5.11)
RDW: 13.9 % (ref 11.5–15.5)
WBC: 20 10*3/uL — ABNORMAL HIGH (ref 4.0–10.5)

## 2016-07-23 LAB — URINE CULTURE: CULTURE: NO GROWTH

## 2016-07-23 LAB — GLUCOSE, CAPILLARY
Glucose-Capillary: 100 mg/dL — ABNORMAL HIGH (ref 65–99)
Glucose-Capillary: 104 mg/dL — ABNORMAL HIGH (ref 65–99)
Glucose-Capillary: 108 mg/dL — ABNORMAL HIGH (ref 65–99)
Glucose-Capillary: 112 mg/dL — ABNORMAL HIGH (ref 65–99)
Glucose-Capillary: 128 mg/dL — ABNORMAL HIGH (ref 65–99)

## 2016-07-23 LAB — HEPATITIS B CORE ANTIBODY, TOTAL: Hep B Core Total Ab: NEGATIVE

## 2016-07-23 LAB — COMMENT2 - HEP PANEL

## 2016-07-23 LAB — HEPATITIS B SURFACE ANTIGEN: HEP B S AG: NEGATIVE

## 2016-07-23 LAB — PROTIME-INR
INR: 1.24
PROTHROMBIN TIME: 15.7 s — AB (ref 11.4–15.2)

## 2016-07-23 NOTE — Progress Notes (Signed)
Patient c/o of 8/10 deep left ear pain during RN assessment. When RN went back in patient's room to question the patient about her ear pain before paging the MD oncall, patient was asleep. Will continue to monitor and treat per MD orders.

## 2016-07-23 NOTE — Progress Notes (Signed)
Subjective: Patient is more alert this morning, but continues to be very drowsy. She is alert to person place and time. She is able to answer questions appropriately but quickly falls back asleep. Yesterday she experienced urinary retention. In and out cath yielded almost 1 L of clear yellow urine. Per RN, patient has since voided spontaneously on her own. She is complaining of pain in her left ear. Patient admitted to injecting ectasy in her left arm the evening prior to admission.  Objective:  Vital signs in last 24 hours: Vitals:   07/22/16 2147 07/23/16 0026 07/23/16 0547 07/23/16 0744  BP: 100/68 98/73 94/64    Pulse: 95 93 82   Resp: Temp: 98.7 F (37.1 C) 98.3 F (36.8 C) 97.3 F (36.3 C)   TempSrc:      SpO2: 98% 98% 99%   Weight:    110 lb 3.7 oz (50 kg)  Height:       Physical Exam Constitutional: Drowsy, diaphoretic  HEENT: Left ear draining serosanguinous fluid. Otoscope not available in room - will assess this afternoon  Neck: Supple, trachea midline.  Cardiovascular: RRR, no murmurs, rubs, or gallops.  Pulmonary/Chest: CTAB, no wheezes, rales, or rhonchi.   Abdominal: Soft, non tender, non distended. +BS.  Extremities: Warm and well perfused. Distal pulses intact. No edema. 2 cm abscess under left axilla Neurological: A&Ox3, CN II - XII grossly intact. No focal deficits. Moving all extremities spontaneously.    Assessment/Plan:  Patient is a 41 year old female past medical history of polysubstance abuse presenting with altered mental status and fever after IV drug use yesterday evening.  Febrile Illness: Fevers now resolved. Per report, patient injected Molly the evening prior to admission. UDS was positive for benzodiazepines and cocaine. IV vancomycin was started per ED provider and continued on admission. CXR and UA were normal. White count has increased from 7.0 on admission to 20 today. Pro-calcitonin is elevated at 18. Blood cultures are still  pending. Hyperthermia is commonly seen with MDMA intoxication. However, new leukocytosis and elevated procalcitonin are concerning for infection. Leukocytosis could also be multifactorial due to acute otitis media (serosanguinous fluid draining from left ear) and stress reaction given her acute hepatic injury. Patient is currently hemodynamically stable and she has remained afebrile overnight. No evidence of Janeway lesions, Osler's nodes, or Roth spots on exam. Will continue to monitor closely.  -- Continue IV vanc for now  -- F/u procalcitonin; if low, consider de-escalating abx  -- F/u Blood cultures   AMS: Patient was altered, diaphoretic, and febrile on admission likely due to acute MDMA intoxication with hepatic injury. Reportedly injected molly (aka ectasy) the evening prior to admission, UDS also positive for benzos and cocaine. She was given 0.4 mg of Narcan with no improvement in mental status. Sodium has remained stable. No signs of other complications of MDMA intoxication such as seizure, serotonin syndrome, or rhabdomyolysis (CK normal). Mental status is beginning to improve. She is more alert today and oriented to place. She is still very drowsy. She answers questions appropriately but quickly falls back asleep. I am hopeful she will continue to improve over the next 12-24 hours.  -- CIWA protocol  -- Continue to monitor   Transaminitis: AST 36, AST 212, alkaline phosphatase 202, T bili 1.3 elevated on admission in the setting of IV drug use. Hepatotoxicity caused by MDMA abuse is well recognized. Labs improving this morning. Also ruling out other causes of hepatitis. -- Hep B Ab  negative  -- Hep C antibody positive; checking viral load and genotype  -- HIV antibodies non reactive  -- Repeat AM CMP   Otitis Media With Effusion: Left ear draining serosanguinous fluid, likely an acute otitis media. Otoscope not available in room. Will assess this afternoon with portable otoscope.  Patient is already receiving IV vancomycin. Will consider narrowing to Augmentin to cover middle ear organisms (Strep pneumo, H. Flu, and moraxella) as well as left axilla abscess pending preliminary blood culture report.  -- Continue IV vanc for now; will consider changing to Augmentin pending blood Cx report   Acute Urinary Retention: Likely anticholinergic effects of MDMA toxicity. Seems to have resolved. Will continue to monitor.   FEN: No fluids, replete lytes prn, regular diet  VTE ppx: Lovenox  Code Status: FULL  Dispo: Anticipated discharge in approximately 1-2 day(s).   Reymundo Poll, MD 07/23/2016, 10:52 AM Pager: [phone]

## 2016-07-24 LAB — COMPREHENSIVE METABOLIC PANEL
ALBUMIN: 2.7 g/dL — AB (ref 3.5–5.0)
ALK PHOS: 140 U/L — AB (ref 38–126)
ALT: 132 U/L — AB (ref 14–54)
ANION GAP: 7 (ref 5–15)
AST: 71 U/L — AB (ref 15–41)
BUN: 10 mg/dL (ref 6–20)
CHLORIDE: 109 mmol/L (ref 101–111)
CO2: 21 mmol/L — AB (ref 22–32)
CREATININE: 0.6 mg/dL (ref 0.44–1.00)
Calcium: 8.5 mg/dL — ABNORMAL LOW (ref 8.9–10.3)
GFR calc non Af Amer: >60 mL/min (ref >60)
Glucose, Bld: 109 mg/dL — ABNORMAL HIGH (ref 65–99)
POTASSIUM: 3.9 mmol/L (ref 3.5–5.1)
SODIUM: 137 mmol/L (ref 135–145)
Total Bilirubin: 0.4 mg/dL (ref 0.3–1.2)
Total Protein: 6.6 g/dL (ref 6.5–8.1)

## 2016-07-24 LAB — CBC
HCT: 42.1 % (ref 36.0–46.0)
HEMOGLOBIN: 13.6 g/dL (ref 12.0–15.0)
MCH: 29.2 pg (ref 26.0–34.0)
MCHC: 32.3 g/dL (ref 30.0–36.0)
MCV: 90.5 fL (ref 78.0–100.0)
Platelets: 213 10*3/uL (ref 150–400)
RBC: 4.65 MIL/uL (ref 3.87–5.11)
RDW: 13.9 % (ref 11.5–15.5)
WBC: 9.3 10*3/uL (ref 4.0–10.5)

## 2016-07-24 LAB — MYOGLOBIN, URINE: Myoglobin, Ur: <2 ng/mL (ref 0–13)

## 2016-07-24 LAB — GLUCOSE, CAPILLARY
GLUCOSE-CAPILLARY: 100 mg/dL — AB (ref 65–99)
Glucose-Capillary: 121 mg/dL — ABNORMAL HIGH (ref 65–99)
Glucose-Capillary: 130 mg/dL — ABNORMAL HIGH (ref 65–99)

## 2016-07-24 LAB — PROCALCITONIN: Procalcitonin: 12.57 ng/mL

## 2016-07-24 MED ORDER — LIDOCAINE HCL (PF) 2 % IJ SOLN
0.0000 mL | Freq: Once | INTRAMUSCULAR | Status: DC | PRN
  Filled 2016-07-24: qty 20

## 2016-07-24 MED ORDER — DOXYCYCLINE HYCLATE 100 MG PO TABS: 100.0000 mg | ORAL_TABLET | Freq: Two times a day (BID) | ORAL | 0 refills | Status: AC

## 2016-07-24 MED ORDER — DOXYCYCLINE HYCLATE 100 MG PO TABS
100.0000 mg | ORAL_TABLET | Freq: Two times a day (BID) | ORAL | Status: DC
  Administered 2016-07-24: 100 mg via ORAL
  Filled 2016-07-24: qty 1

## 2016-07-24 MED ORDER — LIDOCAINE HCL (PF) 2 % IJ SOLN
0.0000 mL | Freq: Once | INTRAMUSCULAR | Status: AC
Start: 2016-07-24 — End: 2016-07-24
  Administered 2016-07-24: 5 mL via INTRADERMAL
  Filled 2016-07-24: qty 20

## 2016-07-24 NOTE — Progress Notes (Signed)
CM received consult : Please provide patient with a list of resources for follow up (no PCP). Pt with medicaid and is assigned and PCP. CM spoke with pt and pt stated she does have a PCP and will f/u with PCP once discharged. Gae Gallop RN,BSN,CM

## 2016-07-24 NOTE — Progress Notes (Signed)
Discharge instructions given. Pt given opportunity to have any questions answered. Pt v/u of instruction given but not cooperative with teach back. In a hurry to leave. All belonging packed and sent with pt. Pt escorted off unit ambulatory with staff in NAD.

## 2016-07-24 NOTE — Progress Notes (Signed)
Subjective: Patient continues to be very sleepy. She is easy to arouse, answers questions appropriately, but quickly falls back asleep. She is complaining of pain in her left ear.   Objective:  Vital signs in last 24 hours: Vitals:   07/24/16 0130 07/24/16 0200 07/24/16 0500 07/24/16 0512  BP: 111/61   (!) 97/53  Pulse: 64   66  Resp: 17   18  Temp: 98.1 F (36.7 C) 97.6 F (36.4 C)  97.8 F (36.6 C)  TempSrc: Oral Oral  Oral  SpO2: 99%   97%  Weight:   117 lb 11.6 oz (53.4 kg)   Height:       Physical Exam Constitutional: Drowsy, diaphoretic  HEENT: Left ear draining serosanguinous fluid.  Neck: Supple, trachea midline.  Cardiovascular: RRR, no murmurs, rubs, or gallops.  Pulmonary/Chest: CTAB, no wheezes, rales, or rhonchi.   Abdominal: Soft, non tender, non distended. +BS.  Extremities: Warm and well perfused. Distal pulses intact. No edema. 2 cm abscess under left axilla  Neurological: A&Ox3, CN II - XII grossly intact. No focal deficits. Moving all extremities spontaneously.    Assessment/Plan:  Patient is a 41 year old female past medical history of polysubstance abuse presenting with altered mental status and fever after IV drug use the evening prior to admission.  Febrile Illness: In the setting of IV drug use. Fevers now resolved.Hyperthermia is commonly seen with MDMA intoxication. Per report,patient injected Molly the evening prior to admission. UDS was positive for benzodiazepines and cocaine. IV vancomycin was started per ED provider and continued on admission. CXR and UA were normal. Patient developed a leukocytosis of 20 yesterday that is now resolved. However, pro calcitonin remains elevated, 18 -> 12.5. This is likely multifactorial from her left axillary abscess and possibly acute otitis media. Blood cultures have remained negative. She is hemodynamically stable. No evidence of Janeway lesions, Osler's nodes, or Roth spots on exam. Will continue to monitor.   -- Discontinue IV vanc; transition to PO doxy for left axillary abscess and otitis media  -- F/u procalcitonin -- F/u Blood cultures >> NG x 24 hours   Otitis Media With Effusion: Left ear draining serosanguinous fluid, likely an acute otitis media vs trauma while intoxicated. Otoscope not available in room. Patient has been receiving IV vanc since admission. Will transition to doxycycline to cover middle ear organisms (Strep pneumo, H. Flu, and moraxella) as well as left axilla abscess. -- Doxycycline    Left Axilla Abscess/cellulitis: 2 cm abscess in left axilla noted on admission. Patient was placed on IV vanc on admission due to febrile illness in the setting of IV drug use concerning for bacteremia. Blood cultures are negative thus far and she has remained afebrile since admission. -- I&D today  -- Doxycycline    AMS: Patient was altered, diaphoretic, and febrile on admission likely due to acute MDMA intoxication with hepatic injury. Reportedly injected molly (aka ectasy) the evening prior to admission, UDS also positive for benzos and cocaine. She was given 0.4 mg of Narcan with no improvement in mental status. Sodium has remained stable. No signs of other complications of MDMA intoxication such as seizure, serotonin syndrome, or rhabdomyolysis (CK normal). Mental status continues to improve. She is more alert today and oriented to place. She is still very drowsy. She answers questions appropriately but quickly falls back asleep. I am hopeful she will continue to improve over the next 12-24 hours.  -- CIWA protocol  -- Continue to monitor   Transaminitis: Likely  multifactorial due to +hep C infection and MDMA toxicity. Hepatotoxicity caused by MDMA abuse is well recognized. Labs continue to improve.  -- Hep B Ab negative  -- Hep C antibody positive; checking viral load and genotype  -- HIV antibodies non reactive  -- Repeat AM CMP   Acute Urinary Retention: Likely anticholinergic  effects of MDMA toxicity. Seems to have resolved. Will continue to monitor.   FEN: No fluids, replete lytes prn, regular diet  VTE ppx: Lovenox  Code Status: FULL  Dispo: Anticipated discharge today.   Reymundo Poll, MD 07/24/2016, 7:41 AM Pager: [phone]

## 2016-07-24 NOTE — Discharge Summary (Signed)
Name: Madison Garza MRN: [id number] DOB: 1976/01/23 41 y.o. PCP: Pcp Not In System  Date of Admission: 07/22/2016  4:41 AM Date of Discharge: 07/24/2016 Attending Physician: Levert Feinstein, MD  Discharge Diagnosis: 1. MDMA Intoxication  2. Polysubstance abuse  3. Left Axilla Abscess  4. Hepatitis C infection   Discharge Medications: Allergies as of 07/24/2016   No Known Allergies     Medication List    STOP taking these medications   methocarbamol 500 MG tablet Commonly known as:  ROBAXIN   oxyCODONE 10 mg 12 hr tablet Commonly known as:  OXYCONTIN   oxyCODONE-acetaminophen 10-325 MG tablet Commonly known as:  PERCOCET     TAKE these medications   doxycycline 100 MG tablet Commonly known as:  VIBRA-TABS Take 1 tablet (100 mg total) by mouth every 12 (twelve) hours.       Disposition and follow-up:   Madison Garza was discharged from [facility] in Stable condition.  At the hospital follow up visit please address:  1.  Polysubstance abuse: Patient presented with fever and AMS after injecting MDMA the evening prior. Fever with MDMA intoxication is common. Blood cultures remained negative and IV vanc was discontinued. UDS was positive for benzodiazepines and cocaine. Mental status improved with supportive measures and she had no further febrile episodes after admission. Cessation was encouraged.   2. Left Axilla Abscess: 2 cm left axillary abscess noted on admission. I&D'd on 07/24/2016 and drained purulent fluid. Packed with gauze. Patient was discharged on a 5 day course of doxycycline and instructed to remove the packing in 1 week.   3. Hepatitis C Infection: Patient was noted to have a transaminitis on admission, likely multifactorial due to MDMA hepatotoxicity and hepatitis C infection. LFTs down trended on discharge. Hepatitis C antibody was positive, viral load 13,400 IU/mL. Ambulatory referral to infectious disease was placed on  discharge.  4.  Labs / imaging needed at time of follow-up: None   5.  Pending labs/ test needing follow-up: None   Follow-up Appointments:   Hospital Course by problem list:  1. MDMA Intoxication: Patient is a 41 year old female with past medical history of polysubstance abuse who presented to the ED with altered mental status and fever. On evaluation patient was extremely altered, diaphoretic, and unable to provide history. She was reportedly dropped off at the ED by a friend. She admitted to IV drugs, specifically injecting ecstasy (MDMA) the evening prior. She was febrile to 101 and hemodynamically stable with blood pressure 107/84, heart rate 114, respiratory rate 26, satting 95% on room air. CBC was unremarkable. CMP was notable for hypokalemia (3.2) and a transaminitis (AST 306, ALT 212, alkaline phosphatase 202, T bili 1.3). Lactic acid was elevated 2.23. Blood alcohol level was undetectable. Urinalysis was unremarkable. UDS was positive for benzodiazepines and amphetamines. Chest x-ray was normal. She was given 0.4 mg of Narcan without improvement in her mental status. Because of her fever and IV drug use, she was started on IV vancomycin on admission. However hyperthermia is commonly seen with MDMA intoxication. She had no leukocytosis or physical exam findings concerning for endocarditits (no evidence of Janeway lesions, Osler's nodes, or Roth spots). Procalcitonin was checked to guide antibiotic therapy but was elevated at 18 (possibly due to left axillar abscess). IV vanc was continued x 2 days until her blood cultures reported no growth. She was transitioned to doxycyline to treat a left axillary abscess on discharge. She had no signs of other  complications of MDMA intoxication such as seizure, serotonin syndrome, or rhabdomyolysis (CK normal). Her mental status cleared over 48 hours after admission and she was back to her baseline on discharge.   2. Transaminitis: CMP was notable for a  transaminitis (AST 306, ALT 212, alkaline phosphatase 202, T bili 1.3) on admission.  Likely multifactorial due to hep C infection and MDMA toxicity. Hepatic injury from MDMA abuse is well recognized. LFTs down trended on discharge (AST 71, ALT 132, alkaline phosphatase 140, T bili 0.4). Hepatitis C antibody was positive, viral load 13,400 IU/mL. Hep B Ab and HIV antibodies were negative. Patient was referred to to Infectious Disease on discharge.   3. Left Axilla Abscess: Patient was noted to have a 2 cm Left axillary abscess on admission. This was I&D'd on 07/24/2016 and packed with gauze. Abscess drained a significant about of purulent fluid. Patient received 2 days of IV vancomycin and was transitioned to doxycycline on discharge. She was instructed to remove the packing in 1 week.   Discharge Vitals:   BP (!) 97/53 (BP Location: Right Arm)   Pulse 66   Temp 97.8 F (36.6 C) (Oral)   Resp 18   Ht  (1.6 m)   Wt 117 lb 11.6 oz (53.4 kg)   LMP 07/22/2013 (Approximate) Comment: "3 years ago"  SpO2 97%   BMI 20.85 kg/m   Pertinent Labs, Studies, and Procedures:   07/22/2016 chest 2 view: IMPRESSION: No active cardiopulmonary disease.  Discharge Instructions: Discharge Instructions    Ambulatory referral to Infectious Disease    Complete by:  As directed    Hep C positive   Call MD for:  redness, tenderness, or signs of infection (pain, swelling, redness, odor or green/yellow discharge around incision site)    Complete by:  As directed    Call MD for:  temperature >100.4    Complete by:  As directed    Diet - low sodium heart healthy    Complete by:  As directed    Discharge instructions    Complete by:  As directed    Madison Garza,  It was a pleasure taking care of you. I am glad you are feeling better. I have sent a prescription for an antibiotic called doxycycline to your pharmacy. Please take this medicine twice a day for the next 5 days for your left arm skin infection and  your ear infection. You may remove the packing in your left arm in 1 week. Please follow up with a primary care provider in the next 1-2 weeks. Thank you!   - Dr. Antony Contras   Increase activity slowly    Complete by:  As directed       Signed: Reymundo Poll, MD 07/30/2016, 8:54 AM   Pager: [phone]

## 2016-07-26 LAB — HEPATITIS C GENOTYPE

## 2016-07-26 LAB — HCV RNA QUANT RFLX ULTRA OR GENOTYP
HCV RNA QNT(LOG COPY/ML): 4.127 {Log_IU}/mL
HEPATITIS C QUANTITATION: 13400 [IU]/mL

## 2016-07-27 LAB — CULTURE, BLOOD (ROUTINE X 2)
CULTURE: NO GROWTH
Culture: NO GROWTH
SPECIAL REQUESTS: ADEQUATE
Special Requests: ADEQUATE

## 2017-02-06 ENCOUNTER — Emergency Department (HOSPITAL_BASED_OUTPATIENT_CLINIC_OR_DEPARTMENT_OTHER): Payer: Self-pay

## 2017-02-06 ENCOUNTER — Encounter (HOSPITAL_COMMUNITY)
Admission: EM | Discharge status: Against Medical Advice | Payer: Self-pay | Source: Home / Self Care | Attending: Family Medicine

## 2017-02-06 ENCOUNTER — Encounter (HOSPITAL_BASED_OUTPATIENT_CLINIC_OR_DEPARTMENT_OTHER): Payer: Self-pay | Admitting: Emergency Medicine

## 2017-02-06 ENCOUNTER — Inpatient Hospital Stay (HOSPITAL_BASED_OUTPATIENT_CLINIC_OR_DEPARTMENT_OTHER)
Admission: EM | Admit: 2017-02-06 | Discharge: 2017-02-09 | DRG: 513 | Discharge status: Against Medical Advice | Payer: Self-pay | Attending: Family Medicine | Admitting: Family Medicine

## 2017-02-06 ENCOUNTER — Ambulatory Visit: Admit: 2017-02-06 | Payer: Self-pay | Admitting: Orthopedic Surgery

## 2017-02-06 ENCOUNTER — Observation Stay (HOSPITAL_COMMUNITY): Payer: Self-pay | Admitting: Certified Registered"

## 2017-02-06 DIAGNOSIS — B954 Other streptococcus as the cause of diseases classified elsewhere: Secondary | ICD-10-CM | POA: Diagnosis present

## 2017-02-06 DIAGNOSIS — M65042 Abscess of tendon sheath, left hand: Principal | ICD-10-CM | POA: Diagnosis present

## 2017-02-06 DIAGNOSIS — L03113 Cellulitis of right upper limb: Secondary | ICD-10-CM | POA: Diagnosis present

## 2017-02-06 DIAGNOSIS — L03119 Cellulitis of unspecified part of limb: Secondary | ICD-10-CM

## 2017-02-06 DIAGNOSIS — F1920 Other psychoactive substance dependence, uncomplicated: Secondary | ICD-10-CM | POA: Diagnosis present

## 2017-02-06 DIAGNOSIS — L03114 Cellulitis of left upper limb: Secondary | ICD-10-CM | POA: Diagnosis present

## 2017-02-06 DIAGNOSIS — F10239 Alcohol dependence with withdrawal, unspecified: Secondary | ICD-10-CM | POA: Diagnosis present

## 2017-02-06 DIAGNOSIS — L03115 Cellulitis of right lower limb: Secondary | ICD-10-CM | POA: Diagnosis present

## 2017-02-06 DIAGNOSIS — F17210 Nicotine dependence, cigarettes, uncomplicated: Secondary | ICD-10-CM | POA: Diagnosis present

## 2017-02-06 DIAGNOSIS — M65142 Other infective (teno)synovitis, left hand: Secondary | ICD-10-CM | POA: Diagnosis present

## 2017-02-06 DIAGNOSIS — L02512 Cutaneous abscess of left hand: Secondary | ICD-10-CM | POA: Diagnosis present

## 2017-02-06 DIAGNOSIS — F1490 Cocaine use, unspecified, uncomplicated: Secondary | ICD-10-CM | POA: Diagnosis present

## 2017-02-06 DIAGNOSIS — L03116 Cellulitis of left lower limb: Secondary | ICD-10-CM | POA: Diagnosis present

## 2017-02-06 DIAGNOSIS — L02519 Cutaneous abscess of unspecified hand: Secondary | ICD-10-CM | POA: Diagnosis present

## 2017-02-06 DIAGNOSIS — B1920 Unspecified viral hepatitis C without hepatic coma: Secondary | ICD-10-CM | POA: Diagnosis present

## 2017-02-06 DIAGNOSIS — F419 Anxiety disorder, unspecified: Secondary | ICD-10-CM | POA: Diagnosis present

## 2017-02-06 DIAGNOSIS — F319 Bipolar disorder, unspecified: Secondary | ICD-10-CM | POA: Diagnosis present

## 2017-02-06 HISTORY — PX: I&D EXTREMITY: SHX5045

## 2017-02-06 LAB — CBC WITH DIFFERENTIAL/PLATELET
BASOS ABS: 0 10*3/uL (ref 0.0–0.1)
BASOS PCT: 0 %
EOS ABS: 0.1 10*3/uL (ref 0.0–0.7)
Eosinophils Relative: 1 %
HCT: 37.8 % (ref 36.0–46.0)
HEMOGLOBIN: 12.7 g/dL (ref 12.0–15.0)
Lymphocytes Relative: 28 %
Lymphs Abs: 2.3 10*3/uL (ref 0.7–4.0)
MCH: 30.8 pg (ref 26.0–34.0)
MCHC: 33.6 g/dL (ref 30.0–36.0)
MCV: 91.5 fL (ref 78.0–100.0)
Monocytes Absolute: 1.3 10*3/uL — ABNORMAL HIGH (ref 0.1–1.0)
Monocytes Relative: 15 %
NEUTROS PCT: 56 %
Neutro Abs: 4.6 10*3/uL (ref 1.7–7.7)
Platelets: 321 10*3/uL (ref 150–400)
RBC: 4.13 MIL/uL (ref 3.87–5.11)
RDW: 13.1 % (ref 11.5–15.5)
WBC: 8.3 10*3/uL (ref 4.0–10.5)

## 2017-02-06 LAB — URINALYSIS, ROUTINE W REFLEX MICROSCOPIC
BILIRUBIN URINE: NEGATIVE
Glucose, UA: NEGATIVE mg/dL
HGB URINE DIPSTICK: NEGATIVE
Ketones, ur: 15 mg/dL — AB
Nitrite: POSITIVE — AB
Protein, ur: NEGATIVE mg/dL
Specific Gravity, Urine: >1.03 — ABNORMAL HIGH (ref 1.005–1.030)
pH: 6 (ref 5.0–8.0)

## 2017-02-06 LAB — COMPREHENSIVE METABOLIC PANEL
ALK PHOS: 57 U/L (ref 38–126)
ALT: 91 U/L — AB (ref 14–54)
AST: 92 U/L — ABNORMAL HIGH (ref 15–41)
Albumin: 3.8 g/dL (ref 3.5–5.0)
Anion gap: 10 (ref 5–15)
BUN: 13 mg/dL (ref 6–20)
CALCIUM: 8.7 mg/dL — AB (ref 8.9–10.3)
CHLORIDE: 98 mmol/L — AB (ref 101–111)
CO2: 29 mmol/L (ref 22–32)
Creatinine, Ser: 0.55 mg/dL (ref 0.44–1.00)
Glucose, Bld: 106 mg/dL — ABNORMAL HIGH (ref 65–99)
Potassium: 3.1 mmol/L — ABNORMAL LOW (ref 3.5–5.1)
Sodium: 137 mmol/L (ref 135–145)
Total Bilirubin: 0.8 mg/dL (ref 0.3–1.2)
Total Protein: 7.6 g/dL (ref 6.5–8.1)

## 2017-02-06 LAB — RAPID URINE DRUG SCREEN, HOSP PERFORMED
AMPHETAMINES: NOT DETECTED
BARBITURATES: NOT DETECTED
BENZODIAZEPINES: NOT DETECTED
Cocaine: POSITIVE — AB
Opiates: POSITIVE — AB
TETRAHYDROCANNABINOL: NOT DETECTED

## 2017-02-06 LAB — URINALYSIS, MICROSCOPIC (REFLEX)

## 2017-02-06 LAB — I-STAT CG4 LACTIC ACID, ED: LACTIC ACID, VENOUS: 0.9 mmol/L (ref 0.5–1.9)

## 2017-02-06 LAB — PREGNANCY, URINE: Preg Test, Ur: NEGATIVE

## 2017-02-06 SURGERY — IRRIGATION AND DEBRIDEMENT EXTREMITY
Anesthesia: General | Site: Hand | Laterality: Left

## 2017-02-06 MED ORDER — KETOROLAC TROMETHAMINE 30 MG/ML IJ SOLN
30.0000 mg | Freq: Four times a day (QID) | INTRAMUSCULAR | Status: DC | PRN
  Administered 2017-02-06 – 2017-02-09 (×8): 30 mg via INTRAVENOUS
  Filled 2017-02-06 (×8): qty 1

## 2017-02-06 MED ORDER — SODIUM CHLORIDE 0.9% FLUSH
3.0000 mL | Freq: Two times a day (BID) | INTRAVENOUS | Status: DC
  Administered 2017-02-07 – 2017-02-09 (×3): 3 mL via INTRAVENOUS

## 2017-02-06 MED ORDER — ONDANSETRON HCL 4 MG/2ML IJ SOLN: 4.0000 mg | Freq: Four times a day (QID) | INTRAMUSCULAR | Status: DC | PRN

## 2017-02-06 MED ORDER — OXYCODONE HCL 5 MG/5ML PO SOLN: 5.0000 mg | Freq: Once | ORAL | Status: AC | PRN

## 2017-02-06 MED ORDER — POLYETHYLENE GLYCOL 3350 17 G PO PACK: 17.0000 g | PACK | Freq: Every day | ORAL | Status: DC | PRN

## 2017-02-06 MED ORDER — ACETAMINOPHEN 650 MG RE SUPP
650.0000 mg | Freq: Four times a day (QID) | RECTAL | Status: DC | PRN
Start: 2017-02-06 — End: 2017-02-09

## 2017-02-06 MED ORDER — PIPERACILLIN-TAZOBACTAM 3.375 G IVPB
3.3750 g | Freq: Three times a day (TID) | INTRAVENOUS | Status: DC
  Administered 2017-02-07 – 2017-02-09 (×9): 3.375 g via INTRAVENOUS
  Filled 2017-02-06 (×10): qty 50

## 2017-02-06 MED ORDER — FOLIC ACID 1 MG PO TABS
1.0000 mg | ORAL_TABLET | Freq: Every day | ORAL | Status: DC
  Administered 2017-02-07 – 2017-02-09 (×3): 1 mg via ORAL
  Filled 2017-02-06 (×3): qty 1

## 2017-02-06 MED ORDER — PROPOFOL 10 MG/ML IV BOLUS
INTRAVENOUS | Status: DC | PRN
  Administered 2017-02-06: 180 mg via INTRAVENOUS

## 2017-02-06 MED ORDER — LIDOCAINE HCL (CARDIAC) 20 MG/ML IV SOLN
INTRAVENOUS | Status: DC | PRN
  Administered 2017-02-06: 40 mg via INTRATRACHEAL

## 2017-02-06 MED ORDER — LORAZEPAM 1 MG PO TABS
1.0000 mg | ORAL_TABLET | Freq: Four times a day (QID) | ORAL | Status: DC | PRN
  Administered 2017-02-08 – 2017-02-09 (×2): 1 mg via ORAL
  Filled 2017-02-06 (×2): qty 1

## 2017-02-06 MED ORDER — LORAZEPAM 2 MG/ML IJ SOLN
1.0000 mg | Freq: Four times a day (QID) | INTRAMUSCULAR | Status: DC | PRN
  Administered 2017-02-06 – 2017-02-09 (×5): 1 mg via INTRAVENOUS
  Filled 2017-02-06 (×5): qty 1

## 2017-02-06 MED ORDER — SODIUM CHLORIDE 0.9 % IV SOLN: 250.0000 mL | INTRAVENOUS | Status: DC | PRN

## 2017-02-06 MED ORDER — OXYCODONE HCL 5 MG PO TABS
5.0000 mg | ORAL_TABLET | Freq: Once | ORAL | Status: AC | PRN
  Administered 2017-02-06: 5 mg via ORAL

## 2017-02-06 MED ORDER — OXYCODONE HCL 5 MG PO TABS
5.0000 mg | ORAL_TABLET | ORAL | Status: DC | PRN
  Administered 2017-02-07: 5 mg via ORAL
  Administered 2017-02-07 – 2017-02-09 (×9): 10 mg via ORAL
  Filled 2017-02-06 (×7): qty 2
  Filled 2017-02-06: qty 1
  Filled 2017-02-06 (×2): qty 2

## 2017-02-06 MED ORDER — LACTATED RINGERS IV SOLN
INTRAVENOUS | Status: DC
  Administered 2017-02-06 (×2): via INTRAVENOUS

## 2017-02-06 MED ORDER — FENTANYL CITRATE (PF) 250 MCG/5ML IJ SOLN
INTRAMUSCULAR | Status: AC
  Filled 2017-02-06: qty 5

## 2017-02-06 MED ORDER — VITAMIN C 500 MG PO TABS
1000.0000 mg | ORAL_TABLET | Freq: Every day | ORAL | Status: DC
  Administered 2017-02-07 – 2017-02-09 (×3): 1000 mg via ORAL
  Filled 2017-02-06 (×3): qty 2

## 2017-02-06 MED ORDER — ACETAMINOPHEN 325 MG PO TABS: 650.0000 mg | ORAL_TABLET | Freq: Four times a day (QID) | ORAL | Status: DC | PRN

## 2017-02-06 MED ORDER — ONDANSETRON HCL 4 MG PO TABS: 4.0000 mg | ORAL_TABLET | Freq: Four times a day (QID) | ORAL | Status: DC | PRN

## 2017-02-06 MED ORDER — ONDANSETRON HCL 4 MG/2ML IJ SOLN: 4.0000 mg | Freq: Once | INTRAMUSCULAR | Status: DC | PRN

## 2017-02-06 MED ORDER — LIDOCAINE 2% (20 MG/ML) 5 ML SYRINGE
INTRAMUSCULAR | Status: AC
  Filled 2017-02-06: qty 15

## 2017-02-06 MED ORDER — SODIUM CHLORIDE 0.9 % IR SOLN
Status: DC | PRN
Start: 2017-02-06 — End: 2017-02-06
  Administered 2017-02-06: 1000 mL

## 2017-02-06 MED ORDER — VANCOMYCIN HCL 500 MG IV SOLR
500.0000 mg | Freq: Three times a day (TID) | INTRAVENOUS | Status: DC
  Administered 2017-02-07 (×3): 500 mg via INTRAVENOUS
  Filled 2017-02-06 (×4): qty 500

## 2017-02-06 MED ORDER — HYDRALAZINE HCL 20 MG/ML IJ SOLN: 5.0000 mg | Freq: Four times a day (QID) | INTRAMUSCULAR | Status: DC | PRN

## 2017-02-06 MED ORDER — VITAMIN B-1 100 MG PO TABS
100.0000 mg | ORAL_TABLET | Freq: Every day | ORAL | Status: DC
  Administered 2017-02-07 – 2017-02-09 (×3): 100 mg via ORAL
  Filled 2017-02-06 (×3): qty 1

## 2017-02-06 MED ORDER — MIDAZOLAM HCL 2 MG/2ML IJ SOLN
INTRAMUSCULAR | Status: DC | PRN
  Administered 2017-02-06: 2 mg via INTRAVENOUS

## 2017-02-06 MED ORDER — BUPIVACAINE HCL (PF) 0.25 % IJ SOLN
INTRAMUSCULAR | Status: AC
  Filled 2017-02-06: qty 30

## 2017-02-06 MED ORDER — ADULT MULTIVITAMIN W/MINERALS CH
1.0000 | ORAL_TABLET | Freq: Every day | ORAL | Status: DC
  Administered 2017-02-07 – 2017-02-09 (×3): 1 via ORAL
  Filled 2017-02-06 (×3): qty 1

## 2017-02-06 MED ORDER — ONDANSETRON HCL 4 MG/2ML IJ SOLN
INTRAMUSCULAR | Status: DC | PRN
  Administered 2017-02-06: 4 mg via INTRAVENOUS

## 2017-02-06 MED ORDER — VANCOMYCIN HCL IN DEXTROSE 1-5 GM/200ML-% IV SOLN
INTRAVENOUS | Status: AC
  Filled 2017-02-06: qty 200

## 2017-02-06 MED ORDER — VANCOMYCIN HCL 1000 MG IV SOLR
INTRAVENOUS | Status: DC | PRN
  Administered 2017-02-06: 1000 mg via INTRAVENOUS

## 2017-02-06 MED ORDER — KETOROLAC TROMETHAMINE 15 MG/ML IJ SOLN
15.0000 mg | Freq: Once | INTRAMUSCULAR | Status: AC
  Administered 2017-02-06: 15 mg via INTRAVENOUS
  Filled 2017-02-06: qty 1

## 2017-02-06 MED ORDER — OXYCODONE HCL 5 MG PO TABS
ORAL_TABLET | ORAL | Status: AC
  Filled 2017-02-06: qty 1

## 2017-02-06 MED ORDER — SODIUM CHLORIDE 0.9% FLUSH: 3.0000 mL | INTRAVENOUS | Status: DC | PRN

## 2017-02-06 MED ORDER — FENTANYL CITRATE (PF) 250 MCG/5ML IJ SOLN
INTRAMUSCULAR | Status: DC | PRN
  Administered 2017-02-06 (×3): 50 ug via INTRAVENOUS
  Administered 2017-02-06: 100 ug via INTRAVENOUS
  Administered 2017-02-06: 50 ug via INTRAVENOUS
  Administered 2017-02-06 (×2): 100 ug via INTRAVENOUS

## 2017-02-06 MED ORDER — SODIUM CHLORIDE 0.9 % IV BOLUS (SEPSIS)
1000.0000 mL | Freq: Once | INTRAVENOUS | Status: AC
  Administered 2017-02-06: 1000 mL via INTRAVENOUS

## 2017-02-06 MED ORDER — MIDAZOLAM HCL 2 MG/2ML IJ SOLN
INTRAMUSCULAR | Status: AC
  Filled 2017-02-06: qty 2

## 2017-02-06 MED ORDER — FENTANYL CITRATE (PF) 100 MCG/2ML IJ SOLN
INTRAMUSCULAR | Status: AC
  Filled 2017-02-06: qty 2

## 2017-02-06 MED ORDER — SODIUM CHLORIDE 0.9 % IV SOLN
Freq: Once | INTRAVENOUS | Status: DC
Start: 2017-02-06 — End: 2017-02-07

## 2017-02-06 MED ORDER — PROPOFOL 10 MG/ML IV BOLUS
INTRAVENOUS | Status: AC
  Filled 2017-02-06: qty 20

## 2017-02-06 MED ORDER — THIAMINE HCL 100 MG/ML IJ SOLN
100.0000 mg | Freq: Every day | INTRAMUSCULAR | Status: DC
  Filled 2017-02-06: qty 2

## 2017-02-06 MED ORDER — FENTANYL CITRATE (PF) 100 MCG/2ML IJ SOLN
25.0000 ug | INTRAMUSCULAR | Status: DC | PRN
  Administered 2017-02-06 (×2): 50 ug via INTRAVENOUS

## 2017-02-06 SURGICAL SUPPLY — 46 items
BANDAGE ACE 4X5 VEL STRL LF (GAUZE/BANDAGES/DRESSINGS) ×3 IMPLANT
BANDAGE ELASTIC 4 VELCRO ST LF (GAUZE/BANDAGES/DRESSINGS) ×6 IMPLANT
BNDG CONFORM 2 STRL LF (GAUZE/BANDAGES/DRESSINGS) ×3 IMPLANT
BNDG GAUZE ELAST 4 BULKY (GAUZE/BANDAGES/DRESSINGS) ×6 IMPLANT
CORDS BIPOLAR (ELECTRODE) ×6 IMPLANT
COVER SURGICAL LIGHT HANDLE (MISCELLANEOUS) ×3 IMPLANT
CUFF TOURNIQUET SINGLE 18IN (TOURNIQUET CUFF) ×3 IMPLANT
CUFF TOURNIQUET SINGLE 24IN (TOURNIQUET CUFF) IMPLANT
DRSG ADAPTIC 3X8 NADH LF (GAUZE/BANDAGES/DRESSINGS) ×3 IMPLANT
GAUZE SPONGE 4X4 12PLY STRL (GAUZE/BANDAGES/DRESSINGS) ×3 IMPLANT
GAUZE SPONGE 4X4 12PLY STRL LF (GAUZE/BANDAGES/DRESSINGS) ×3 IMPLANT
GAUZE XEROFORM 1X8 LF (GAUZE/BANDAGES/DRESSINGS) ×3 IMPLANT
GAUZE XEROFORM 5X9 LF (GAUZE/BANDAGES/DRESSINGS) ×3 IMPLANT
GLOVE BIOGEL M 8.0 STRL (GLOVE) IMPLANT
GLOVE SS BIOGEL STRL SZ 8 (GLOVE) ×1 IMPLANT
GLOVE SUPERSENSE BIOGEL SZ 8 (GLOVE) ×2
GOWN STRL REUS W/ TWL LRG LVL3 (GOWN DISPOSABLE) ×1 IMPLANT
GOWN STRL REUS W/ TWL XL LVL3 (GOWN DISPOSABLE) ×2 IMPLANT
GOWN STRL REUS W/TWL LRG LVL3 (GOWN DISPOSABLE) ×2
GOWN STRL REUS W/TWL XL LVL3 (GOWN DISPOSABLE) ×4
KIT BASIN OR (CUSTOM PROCEDURE TRAY) ×3 IMPLANT
KIT ROOM TURNOVER OR (KITS) ×3 IMPLANT
MANIFOLD NEPTUNE II (INSTRUMENTS) ×3 IMPLANT
NEEDLE HYPO 25GX1X1/2 BEV (NEEDLE) IMPLANT
NS IRRIG 1000ML POUR BTL (IV SOLUTION) ×3 IMPLANT
PACK ORTHO EXTREMITY (CUSTOM PROCEDURE TRAY) ×3 IMPLANT
PAD ARMBOARD 7.5X6 YLW CONV (MISCELLANEOUS) ×3 IMPLANT
PAD CAST 4YDX4 CTTN HI CHSV (CAST SUPPLIES) ×2 IMPLANT
PADDING CAST COTTON 4X4 STRL (CAST SUPPLIES) ×4
PILLOW ARM CARTER ADULT (MISCELLANEOUS) ×3 IMPLANT
SCRUB BETADINE 4OZ XXX (MISCELLANEOUS) ×3 IMPLANT
SOL PREP POV-IOD 4OZ 10% (MISCELLANEOUS) ×3 IMPLANT
SPLINT FIBERGLASS 3X12 (CAST SUPPLIES) ×3 IMPLANT
SPONGE LAP 4X18 X RAY DECT (DISPOSABLE) ×3 IMPLANT
SUT PROLENE 4 0 PS 2 18 (SUTURE) ×3 IMPLANT
SUT VIC AB 4-0 PS2 18 (SUTURE) ×3 IMPLANT
SWAB CULTURE ESWAB REG 1ML (MISCELLANEOUS) ×6 IMPLANT
SYR CONTROL 10ML LL (SYRINGE) IMPLANT
TOWEL OR 17X24 6PK STRL BLUE (TOWEL DISPOSABLE) ×3 IMPLANT
TOWEL OR 17X26 10 PK STRL BLUE (TOWEL DISPOSABLE) ×3 IMPLANT
TUBE ANAEROBIC SPECIMEN COL (MISCELLANEOUS) ×6 IMPLANT
TUBE CONNECTING 12'X1/4 (SUCTIONS) ×1
TUBE CONNECTING 12X1/4 (SUCTIONS) ×2 IMPLANT
TUBING CYSTO DISP (UROLOGICAL SUPPLIES) ×3 IMPLANT
WATER STERILE IRR 1000ML POUR (IV SOLUTION) IMPLANT
YANKAUER SUCT BULB TIP NO VENT (SUCTIONS) ×3 IMPLANT

## 2017-02-06 NOTE — Op Note (Signed)
SEE FULL [id number]  Jaimey Franchini M.D.

## 2017-02-06 NOTE — Anesthesia Preprocedure Evaluation (Signed)
Anesthesia Evaluation   Patient awake    Reviewed: Allergy & Precautions, NPO status , Patient's Chart, lab work & pertinent test results  Airway Mallampati: II  TM Distance: >3 FB Neck ROM: Full    Dental  (+) Teeth Intact, Dental Advisory Given   Pulmonary Current Smoker,    breath sounds clear to auscultation       Cardiovascular  Rhythm:Regular Rate:Normal     Neuro/Psych    GI/Hepatic   Endo/Other    Renal/GU      Musculoskeletal   Abdominal   Peds  Hematology   Anesthesia Other Findings   Reproductive/Obstetrics                             Anesthesia Physical Anesthesia Plan  ASA: II and emergent  Anesthesia Plan: General   Post-op Pain Management:    Induction: Intravenous  PONV Risk Score and Plan: Ondansetron and Dexamethasone  Airway Management Planned: LMA  Additional Equipment:   Intra-op Plan:   Post-operative Plan:   Informed Consent: I have reviewed the patients History and Physical, chart, labs and discussed the procedure including the risks, benefits and alternatives for the proposed anesthesia with the patient or authorized representative who has indicated his/her understanding and acceptance.   Dental advisory given  Plan Discussed with: CRNA and Anesthesiologist  Anesthesia Plan Comments:         Anesthesia Quick Evaluation

## 2017-02-06 NOTE — Progress Notes (Signed)
Difficulty getting admission information d/t letargy after transportation to unit. Initial sections and head to toe assessment completed.

## 2017-02-06 NOTE — Op Note (Signed)
Status post irrigation and debridement deep abscess left hand dorsal Madison Garza. Patient underwent extensive/radical tenolysis tenosynovectomy of the extensor apparatus including the second through fifth compartments. We removed clotted vascular channels (venous system) impregnated with heroin. We will leave drains in in go back to the operating room Thursday for possible closure  Madison would recommend vancomycin and Zosyn until cultures return  Maximum elevation and observation  She complains of some right hand pain and we'll see where this goes in terms of its declaration. Currently no obvious abscess or needle penetration marks  Mayo Faulk M.D.
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
## 2017-02-06 NOTE — Consult Note (Signed)
Reason for Consult:Hand infection Referring Physician: Orson Garza is an 41 y.o. female.  HPI: Madison Garza presented to [facility] with left hand pain of about 4d duration. She denied any precipitating event or trauma. She appears intoxicated and it is difficult to elicit a good history or exam. She admits to occasional IVDU with cocaine but says it's been "a while" since she last injected. She was transferred to [facility] for operative treatment of what is likely a deep space infection. She is RHD.  Past Medical History:  Diagnosis Date  . Alcohol abuse   . Bipolar 1 disorder ([place])   . Depression   . Drug abuse ([place])   . TWSFKCLE(751.7)     Past Surgical History:  Procedure Laterality Date  . BREAST ENHANCEMENT SURGERY    . ORIF ELBOW FRACTURE Left 01/17/2016   Procedure: OPEN REDUCTION INTERNAL FIXATION (ORIF) ELBOW/OLECRANON FRACTURE AND I&D;  Surgeon: Rod Can, MD;  Location: [place];  Service: Orthopedics;  Laterality: Left;    No family history on file.  Social History:  reports that she has been smoking.  She has been smoking about 1.00 pack per day. She has never used smokeless tobacco. She reports that she drinks about 42.0 oz of alcohol per week . She reports that she uses drugs, including Benzodiazepines, Cocaine, Marijuana, Hydrocodone, and IV.  Allergies: No Known Allergies  Medications: I have reviewed the patient's current medications.  Results for orders placed or performed during the hospital encounter of 02/06/17 (from the past 48 hour(s))  Comprehensive metabolic panel     Status: Abnormal   Collection Time: 02/06/17  8:27 AM  Result Value Ref Range   Sodium 137 135 - 145 mmol/L   Potassium 3.1 (L) 3.5 - 5.1 mmol/L   Chloride 98 (L) 101 - 111 mmol/L   CO2 29 22 - 32 mmol/L   Glucose, Bld 106 (H) 65 - 99 mg/dL   BUN 13 6 - 20 mg/dL   Creatinine, Ser 0.55 0.44 - 1.00 mg/dL   Calcium 8.7 (L) 8.9 - 10.3 mg/dL   Total Protein 7.6 6.5 - 8.1 g/dL   Albumin 3.8 3.5 -  5.0 g/dL   AST 92 (H) 15 - 41 U/L   ALT 91 (H) 14 - 54 U/L   Alkaline Phosphatase 57 38 - 126 U/L   Total Bilirubin 0.8 0.3 - 1.2 mg/dL   GFR calc non Af Amer >60 >60 mL/min   GFR calc Af Amer >60 >60 mL/min    Comment: (NOTE) The eGFR has been calculated using the CKD EPI equation. This calculation has not been validated in all clinical situations. eGFR's persistently <60 mL/min signify possible Chronic Kidney Disease.    Anion gap 10 5 - 15  CBC with Differential     Status: Abnormal   Collection Time: 02/06/17  8:27 AM  Result Value Ref Range   WBC 8.3 4.0 - 10.5 K/uL   RBC 4.13 3.87 - 5.11 MIL/uL   Hemoglobin 12.7 12.0 - 15.0 g/dL   HCT 37.8 36.0 - 46.0 %   MCV 91.5 78.0 - 100.0 fL   MCH 30.8 26.0 - 34.0 pg   MCHC 33.6 30.0 - 36.0 g/dL   RDW 13.1 11.5 - 15.5 %   Platelets 321 150 - 400 K/uL   Neutrophils Relative % 56 %   Neutro Abs 4.6 1.7 - 7.7 K/uL   Lymphocytes Relative 28 %   Lymphs Abs 2.3 0.7 - 4.0 K/uL  Monocytes Relative 15 %   Monocytes Absolute 1.3 (H) 0.1 - 1.0 K/uL   Eosinophils Relative 1 %   Eosinophils Absolute 0.1 0.0 - 0.7 K/uL   Basophils Relative 0 %   Basophils Absolute 0.0 0.0 - 0.1 K/uL  I-Stat CG4 Lactic Acid, ED     Status: None   Collection Time: 02/06/17  8:39 AM  Result Value Ref Range   Lactic Acid, Venous 0.90 0.5 - 1.9 mmol/L  Pregnancy, urine     Status: None   Collection Time: 02/06/17 12:00 PM  Result Value Ref Range   Preg Test, Ur NEGATIVE NEGATIVE    Comment:        THE SENSITIVITY OF THIS METHODOLOGY IS >20 mIU/mL.   Urine rapid drug screen (hosp performed)     Status: Abnormal   Collection Time: 02/06/17 12:00 PM  Result Value Ref Range   Opiates POSITIVE (A) NONE DETECTED   Cocaine POSITIVE (A) NONE DETECTED   Benzodiazepines NONE DETECTED NONE DETECTED   Amphetamines NONE DETECTED NONE DETECTED   Tetrahydrocannabinol NONE DETECTED NONE DETECTED   Barbiturates NONE DETECTED NONE DETECTED    Comment:        DRUG  SCREEN FOR MEDICAL PURPOSES ONLY.  IF CONFIRMATION IS NEEDED FOR ANY PURPOSE, NOTIFY LAB WITHIN 5 DAYS.        LOWEST DETECTABLE LIMITS FOR URINE DRUG SCREEN Drug Class       Cutoff (ng/mL) Amphetamine      1000 Barbiturate      200 Benzodiazepine   620 Tricyclics       355 Opiates          300 Cocaine          300 THC              50   Urinalysis, Routine w reflex microscopic     Status: Abnormal   Collection Time: 02/06/17 12:00 PM  Result Value Ref Range   Color, Urine YELLOW YELLOW   APPearance CLOUDY (A) CLEAR   Specific Gravity, Urine >1.030 (H) 1.005 - 1.030   pH 6.0 5.0 - 8.0   Glucose, UA NEGATIVE NEGATIVE mg/dL   Hgb urine dipstick NEGATIVE NEGATIVE   Bilirubin Urine NEGATIVE NEGATIVE   Ketones, ur 15 (A) NEGATIVE mg/dL   Protein, ur NEGATIVE NEGATIVE mg/dL   Nitrite POSITIVE (A) NEGATIVE   Leukocytes, UA SMALL (A) NEGATIVE  Urinalysis, Microscopic (reflex)     Status: Abnormal   Collection Time: 02/06/17 12:00 PM  Result Value Ref Range   RBC / HPF 0-5 0 - 5 RBC/hpf   WBC, UA TOO NUMEROUS TO COUNT 0 - 5 WBC/hpf   Bacteria, UA MANY (A) NONE SEEN   Squamous Epithelial / LPF 6-30 (A) NONE SEEN   Mucus PRESENT     Dg Hand Complete Left  Result Date: 02/06/2017 CLINICAL DATA:  Pain and swelling EXAM: LEFT HAND - COMPLETE 3+ VIEW COMPARISON:  None. FINDINGS: Frontal, oblique, and lateral views were obtained. There is marked soft tissue swelling throughout the dorsal aspect of the hand and wrist. No acute fracture or dislocation. Joint spaces appear normal. No erosive change or bony destruction. No radiopaque foreign body. IMPRESSION: Diffuse soft tissue swelling dorsally. No fracture or dislocation. No evident arthropathy. Electronically Signed   By: Lowella Grip III M.D.   On: 02/06/2017 08:14    Review of Systems  Unable to perform ROS: Mental acuity  Constitutional: Negative for weight loss.  HENT: Negative  for ear discharge, ear pain, hearing loss and  tinnitus.   Eyes: Negative for blurred vision, double vision, photophobia and pain.  Respiratory: Negative for cough, sputum production and shortness of breath.   Cardiovascular: Negative for chest pain.  Gastrointestinal: Negative for abdominal pain, nausea and vomiting.  Genitourinary: Negative for dysuria, flank pain, frequency and urgency.  Musculoskeletal: Positive for joint pain (Left hand). Negative for back pain, falls, myalgias and neck pain.  Neurological: Negative for dizziness, tingling, sensory change, focal weakness, loss of consciousness and headaches.  Endo/Heme/Allergies: Does not bruise/bleed easily.  Psychiatric/Behavioral: Negative for depression, memory loss and substance abuse. The patient is not nervous/anxious.    Blood pressure 133/87, pulse 92, temperature 98.4 F (36.9 C), temperature source Oral, resp. rate 18, height 5' 3.5" (1.613 m), weight 54.4 kg (120 lb), SpO2 99 %. Physical Exam  Constitutional: She appears well-developed and well-nourished. She appears lethargic. No distress.  HENT:  Head: Normocephalic.  Eyes: Conjunctivae are normal. Right eye exhibits no discharge. Left eye exhibits no discharge. No scleral icterus.  Cardiovascular: Normal rate and regular rhythm.   Respiratory: Effort normal. No respiratory distress.  Musculoskeletal:  Left shoulder, elbow, wrist, digits- no skin wounds, hand TTP, no instability, no blocks to motion except pain  Sens  Ax normal R/M/U paresthetic  Mot   Ax/ R/ PIN/ M/ AIN/ U intact but limited by pain  Rad 2+  Neurological: She appears lethargic.  Skin: Skin is warm and dry. She is not diaphoretic.  Psychiatric: She has a normal mood and affect. Her behavior is normal. Her speech is slurred.    Assessment/Plan: Left hand infection -- To OR this evening with Dr. Amedeo Plenty for I&D. Please keep NPO. Abx per primary service.  Polysubstance abuse -- Needs SW consult, will put in.  Plan for I&D left hand  Will watch  right hand closely given complaints of soreness   Lisette Abu, PA-C Orthopedic Surgery [phone] 02/06/2017, 4:34 PM

## 2017-02-06 NOTE — ED Notes (Signed)
Attempted to call report x 2 to floor and was placed on hold

## 2017-02-06 NOTE — Transfer of Care (Signed)
Immediate Anesthesia Transfer of Care Note  Patient: Madison Garza  Procedure(s) Performed: IRRIGATION AND DEBRIDEMENT LEFT HAND (Left Hand)  Patient Location: PACU  Anesthesia Type:General  Level of Consciousness: awake, alert  and oriented  Airway & Oxygen Therapy: Patient connected to nasal cannula oxygen  Post-op Assessment: Report given to RN, Post -op Vital signs reviewed and stable and Patient moving all extremities X 4  Post vital signs: Reviewed and stable  Last Vitals:  Vitals:   02/06/17 1435 02/06/17 1640  BP: 133/87 130/84  Pulse: 92 96  Resp: 18 18  Temp:  36.8 C  SpO2: 99% 98%    Last Pain:  Vitals:   02/06/17 1902  TempSrc:   PainSc: 9          Complications: No apparent anesthesia complications

## 2017-02-06 NOTE — ED Triage Notes (Signed)
Pt has cellulitis to left hand, redness, swelling and inflammation.  Pt states it has been going on for 5 days.

## 2017-02-06 NOTE — Anesthesia Procedure Notes (Signed)
Procedure Name: LMA Insertion Date/Time: 02/06/2017 8:24 PM Performed by: Molli HazardGORDON, Keymari Sato M Pre-anesthesia Checklist: Patient identified, Emergency Drugs available, Suction available and Patient being monitored Patient Re-evaluated:Patient Re-evaluated prior to induction Oxygen Delivery Method: Circle system utilized Preoxygenation: Pre-oxygenation with 100% oxygen Induction Type: IV induction Ventilation: Mask ventilation without difficulty LMA: LMA inserted LMA Size: 4.0 Placement Confirmation: positive ETCO2 and breath sounds checked- equal and bilateral Tube secured with: Tape Dental Injury: Teeth and Oropharynx as per pre-operative assessment

## 2017-02-06 NOTE — ED Notes (Signed)
patients belongings put at desk for safety

## 2017-02-06 NOTE — ED Notes (Addendum)
Rounding on pateint. Patient is very sleepy with lighter on her left arm - wakes up with this RN stimulating her, I V bag is wrapped around her and needed to be untangled. The patient has been looking for something in her purse and then fell asleep. To this Rn the patient seemed very defensive when she was asked why she was going through her purse and why she was sleepy - states "why you asking all these questions, you think I did something." " its like your my mother"  Charge RN aware - Vitals checked

## 2017-02-06 NOTE — Progress Notes (Signed)
Made Anesthesia MD aware of BP 160/105.  No new orders at this time will continue to monitor and treat as needed.

## 2017-02-06 NOTE — H&P (Signed)
History and Physical  Madison Garza ZOX:[id number] DOB: July 01, 1975 DOA: 02/06/2017  Referring physician: Tilden Fossa  PCP: System, Provider Not In  Outpatient Specialists:None Patient coming from: [facility] ED & is able to ambulate independently  Chief Complaint: left hand swelling and pain   HPI: Madison Garza is a 41 y.o. female with medical history significant for IV drug use, polysubstance, alcohol abuse, hepatitis C who presents as a transfer from med Center at [facility] ER with complaints of bilateral hand swelling and pain for 1 week.  Patient states she was released from jail approximately 3 weeks ago since then has been participating in IV drug use.  She states she " shoots up" in her hands, and uses tap water to prepare her drugs.   a week ago she initially noticed decreased strength in her hands particularly when trying to grip objects.  It progressively worsened to increased pain, swelling, and subjective fevers which prompted her to come to the ED.  Otherwise patient denies any trauma to  her hands.  Of note patient was last seen at [facility] on 07/24/2016 for acute toxic encephalopathy secondary to injected MDMA.   ED Course: Afebrile, normal respiratory rate, normal oxygen saturation.  Initially tachycardic to 125 with blood pressure 146/109.  Received 1 L normal saline bolus. UA obtained: Positive nitrites, small leukocytes, rare bacteria.  CMP significant for potassium 3.1 chloride 98.  CBC  with normal white count of 8.3  X-ray of left hand show diffuse soft tissue swelling dorsally, with no fracture or dislocation evident.  Review of Systems: Patient seen on the floor. Pt complains of left hand pain.  Pt denies any chills, nausea, vomiting, chest pain, abdominal pain, dysuria.  Review of systems are otherwise negative   Past Medical History:  Diagnosis Date  . Alcohol abuse   . Bipolar 1 disorder (HCC)   . Depression   . Drug abuse (HCC)   .  [id number](295.6)    Past Surgical History:  Procedure Laterality Date  . BREAST ENHANCEMENT SURGERY    . ORIF ELBOW FRACTURE Left 01/17/2016   Procedure: OPEN REDUCTION INTERNAL FIXATION (ORIF) ELBOW/OLECRANON FRACTURE AND I&D;  Surgeon: Samson Frederic, MD;  Location: [facility] OR;  Service: Orthopedics;  Laterality: Left;    Social History:  reports that she has been smoking.  She has been smoking about 1.00 pack per day. She has never used smokeless tobacco. She reports that she drinks about 42.0 oz of alcohol per week . She reports that she uses drugs, including Benzodiazepines, Cocaine, Marijuana, Hydrocodone, and IV.   No Known Allergies  History reviewed. No pertinent family history.    Prior to Admission medications   Medication Sig Start Date End Date Taking? Authorizing Provider  doxycycline (VIBRA-TABS) 100 MG tablet Take 1 tablet (100 mg total) by mouth every 12 (twelve) hours. 07/24/16   Reymundo Poll, MD    Physical Exam: BP (!) 199/96   Pulse 86   Temp (!) 97.5 F (36.4 C)   Resp 12   Ht 5\' 3"  (1.6 m)   Wt 54.5 kg (120 lb 2.4 oz)   SpO2 95%   BMI 21.28 kg/m    General: Somnolent, lethargic, answering questions appropriately anicteric sclera Eyes: Pupils normal size ENT: Dry oral mucosa Neck: Normal Cardiovascular: Regular rate and rhythm, no murmurs Respiratory: Lungs clear to auscultation bilaterally, no rales, no wheezes, or rhonchi Abdomen: Soft, nondistended, nontender Skin: Left hand significantly erythematous, exquisitely tender, swollen from mid  digits to the MCPs.  Right hand: Mild swelling, blanching erythema diffusely spread on palms and dorsal side of hand.  Bilateral feet: Blanching erythema to the tips of toes espresso lateral sides, also tender to touch, Musculoskeletal: Limited range of motion handgrip  Neurologic: Slow and speak but oriented to person, place, time, context.  Able to move both upper and lower extremities          Labs on Admission:   Basic Metabolic Panel:  Recent Labs Lab 02/06/17 0827  NA 137  K 3.1*  CL 98*  CO2 29  GLUCOSE 106*  BUN 13  CREATININE 0.55  CALCIUM 8.7*   Liver Function Tests:  Recent Labs Lab 02/06/17 0827  AST 92*  ALT 91*  ALKPHOS 57  BILITOT 0.8  PROT 7.6  ALBUMIN 3.8   No results for input(s): LIPASE, AMYLASE in the last 168 hours. No results for input(s): AMMONIA in the last 168 hours. CBC:  Recent Labs Lab 02/06/17 0827  WBC 8.3  NEUTROABS 4.6  HGB 12.7  HCT 37.8  MCV 91.5  PLT 321   Cardiac Enzymes: No results for input(s): CKTOTAL, CKMB, CKMBINDEX, TROPONINI in the last 168 hours.  BNP (last 3 results) No results for input(s): BNP in the last 8760 hours.  ProBNP (last 3 results) No results for input(s): PROBNP in the last 8760 hours.  CBG: No results for input(s): GLUCAP in the last 168 hours.  Radiological Exams on Admission: Dg Hand Complete Left  Result Date: 02/06/2017 CLINICAL DATA:  Pain and swelling EXAM: LEFT HAND - COMPLETE 3+ VIEW COMPARISON:  None. FINDINGS: Frontal, oblique, and lateral views were obtained. There is marked soft tissue swelling throughout the dorsal aspect of the hand and wrist. No acute fracture or dislocation. Joint spaces appear normal. No erosive change or bony destruction. No radiopaque foreign body. IMPRESSION: Diffuse soft tissue swelling dorsally. No fracture or dislocation. No evident arthropathy. Electronically Signed   By: Bretta BangWilliam  Woodruff III M.D.   On: 02/06/2017 08:14    EKG: None  Assessment/Plan Present on Admission: . Cellulitis and abscess of hand . Anxiety disorder . Polysubstance dependence (HCC)  Principal Problem:   Cellulitis and abscess of hand Active Problems:   Polysubstance dependence (HCC)   Anxiety disorder   Abscess, left hand Cellulitis of right hand and bilateral feet (toes) Risk factor IV drug use (admits to recent injection of IV drug in the hands) Hand x-ray: Soft tissue  swelling, diffusely Patient remains stable with no signs of sepsis Suspect erythematous swelling of right hand and bilateral feet consistent with cellulitis, especially considering IVDU No stigmata of infectious endocarditis on physical exam  -OR tonight for I&D of left hand - Hold off on antibiotics to improve culture yield for blood and intraoperative culture, would advise starting antibiotics in a.m. after procedure for cellulitis of hands and feet -A.m. CBC, CMP -Continue to monitor right hand and bilateral feet  IV drug use, polysubstance UDS positive for cocaine and opiates on admission.  Patient admits to smoking cocaine this morning. -Judicious pain control, IV ketorolac prior to the OR   Hepatitis C Positive antibody on 07/22/2016, hep C quant: 13,400, one a genotype -Patient has not undergone treatment since diagnosis  Chronic alcohol use Patient denies however per chart review reports previous heavy alcohol use -[place] protocol -Multivitamin, folic acid, thiamine supplementation  DVT prophylaxis: Held since planning for surgery  Code Status: Full code  Family Communication: No family admits that  Disposition Plan:  OR today  Consults called: Surgery  Admission status: Observation    Laverna Peace MD [facility]  Pager [phone]  If 7PM-7AM, please contact night-coverage [email or web address] Password [email or web address]  02/06/2017, 9:55 PM

## 2017-02-06 NOTE — Progress Notes (Signed)
Pharmacy Antibiotic Note  Madison Garza is a 41 y.o. female admitted on 02/06/2017 with wound infection.  Pharmacy has been consulted for Vancomycin/Zosyn dosing. WBC WNL. Renal function good. S/P I&D in the OR.   Plan: Vancomycin 500 mg IV q8h Zosyn 3.375G IV q8h to be infused over 4 hours Trend WBC, temp, renal function  F/U infectious work-up Drug levels as indicated   Height: 5\' 3"  (160 cm) Weight: 120 lb 2.4 oz (54.5 kg) IBW/kg (Calculated) : 52.4  Temp (24hrs), Avg:98.2 F (36.8 C), Min:97.5 F (36.4 C), Max:98.9 F (37.2 C)   Recent Labs Lab 02/06/17 0827 02/06/17 0839  WBC 8.3  --   CREATININE 0.55  --   LATICACIDVEN  --  0.90    Estimated Creatinine Clearance: 75.8 mL/min (by C-G formula based on SCr of 0.55 mg/dL).    No Known Allergies  [name], Corona Popovich 02/06/2017 11:38 PM

## 2017-02-07 ENCOUNTER — Encounter (HOSPITAL_COMMUNITY): Payer: Self-pay | Admitting: Orthopedic Surgery

## 2017-02-07 DIAGNOSIS — B182 Chronic viral hepatitis C: Secondary | ICD-10-CM

## 2017-02-07 DIAGNOSIS — L03119 Cellulitis of unspecified part of limb: Secondary | ICD-10-CM

## 2017-02-07 DIAGNOSIS — L02519 Cutaneous abscess of unspecified hand: Secondary | ICD-10-CM

## 2017-02-07 LAB — CBC
HEMATOCRIT: 35.6 % — AB (ref 36.0–46.0)
HEMOGLOBIN: 11.7 g/dL — AB (ref 12.0–15.0)
MCH: 30.6 pg (ref 26.0–34.0)
MCHC: 32.9 g/dL (ref 30.0–36.0)
MCV: 93.2 fL (ref 78.0–100.0)
Platelets: 298 10*3/uL (ref 150–400)
RBC: 3.82 MIL/uL — ABNORMAL LOW (ref 3.87–5.11)
RDW: 13.4 % (ref 11.5–15.5)
WBC: 4.5 10*3/uL (ref 4.0–10.5)

## 2017-02-07 LAB — COMPREHENSIVE METABOLIC PANEL
ALBUMIN: 2.7 g/dL — AB (ref 3.5–5.0)
ALK PHOS: 46 U/L (ref 38–126)
ALT: 83 U/L — ABNORMAL HIGH (ref 14–54)
AST: 105 U/L — AB (ref 15–41)
Anion gap: 10 (ref 5–15)
BILIRUBIN TOTAL: 0.7 mg/dL (ref 0.3–1.2)
BUN: 10 mg/dL (ref 6–20)
CALCIUM: 8.1 mg/dL — AB (ref 8.9–10.3)
CO2: 26 mmol/L (ref 22–32)
CREATININE: 0.61 mg/dL (ref 0.44–1.00)
Chloride: 103 mmol/L (ref 101–111)
GFR calc Af Amer: >60 mL/min (ref >60)
GLUCOSE: 103 mg/dL — AB (ref 65–99)
POTASSIUM: 2.9 mmol/L — AB (ref 3.5–5.1)
Sodium: 139 mmol/L (ref 135–145)
TOTAL PROTEIN: 5.5 g/dL — AB (ref 6.5–8.1)

## 2017-02-07 LAB — MAGNESIUM: MAGNESIUM: 1.9 mg/dL (ref 1.7–2.4)

## 2017-02-07 MED ORDER — POTASSIUM CHLORIDE CRYS ER 20 MEQ PO TBCR
40.0000 meq | EXTENDED_RELEASE_TABLET | Freq: Two times a day (BID) | ORAL | Status: AC
  Administered 2017-02-07 – 2017-02-08 (×3): 40 meq via ORAL
  Filled 2017-02-07 (×3): qty 2

## 2017-02-07 NOTE — Anesthesia Postprocedure Evaluation (Signed)
Anesthesia Post Note  Patient: Madison Garza  Procedure(s) Performed: IRRIGATION AND DEBRIDEMENT LEFT HAND (Left Hand)     Patient location during evaluation: PACU Anesthesia Type: General Level of consciousness: awake, awake and alert and oriented Pain management: pain level controlled Vital Signs Assessment: post-procedure vital signs reviewed and stable Respiratory status: spontaneous breathing, nonlabored ventilation and respiratory function stable Cardiovascular status: blood pressure returned to baseline Anesthetic complications: no    Last Vitals:  Vitals:   02/06/17 2215 02/06/17 2235  BP: (!) 206/109 (!) 154/93  Pulse: 93 76  Resp: 20 18  Temp: 36.6 C 37.2 C  SpO2: 99% 100%    Last Pain:  Vitals:   02/06/17 2252  TempSrc:   PainSc: 8                  Jennae Hakeem COKER

## 2017-02-07 NOTE — Progress Notes (Signed)
PROGRESS NOTE    Madison Garza  ZOX:[id number] DOB: 06-22-1975 DOA: 02/06/2017 PCP: System, Provider Not In      Brief Narrative:  41 yo F with IVDU, hep C who presents with hand pain and swelling for 1 week, found to have purulent cellulitis of the hand. Taken to the OR by Dr. Amanda Pea on 10/23, debrided, intra-op cultures sent.     Assessment & Plan:  Principal Problem:   Cellulitis and abscess of hand Active Problems:   Polysubstance dependence (HCC)   Anxiety disorder   Cellulitis of both feet   Cellulitis of right hand   Cellulitis of hand, left   Hand cellulitis and abscess Hand culture negative so far.  Blood cultures negative.  -Vancomycin and Zosyn per Ortho -Follow culture data -Toradol or oxycodone for pain   Hepatitis C Transaminases mildly up.  Untreated -Outpatient referral to GI  Alcohol abuse -Continue CIWA for now       DVT prophylaxis: SCDs Code Status: FULL Family Communication: None present Disposition Plan: Per orthopedics   Consultants:   Orthopedics  Procedures:  irrigation and debridement deep abscess left hand dorsal, extensive/radical tenolysis tenosynovectomy of the extensor apparatus including the second through fifth compartments on 10/23  Antimicrobials:   Vancomycin 10/23 >>  Zosyn 10/23 >>    Cultures: Abscess 10/23 >> Rare group G strep Tissue 10/23 >> NG Blood x1 10/23 >> NG   Subjective: Sleeping, rousable, falls back asleep.  Difficulty to elicit anything else.  Is hungry.  Objective: Vitals:   02/07/17 0235 02/07/17 0455 02/07/17 0819 02/07/17 1329  BP: 134/79 (!) 142/75 130/68 120/67  Pulse: 69 71 89 70  Resp: 18 18 18 18   Temp: 98.2 F (36.8 C) 98.2 F (36.8 C) 98 F (36.7 C) 98 F (36.7 C)  TempSrc: Oral Oral Oral Oral  SpO2: 100% 100% 100% 100%  Weight:      Height:        Intake/Output Summary (Last 24 hours) at 02/07/17 1520 Last data filed at 02/07/17 0541  Gross per 24 hour    Intake             1125 ml  Output               50 ml  Net             1075 ml   Filed Weights   02/06/17 0739 02/06/17 1640  Weight: 54.4 kg (120 lb) 54.5 kg (120 lb 2.4 oz)    Examination: General appearance: Well nourised adult female, sleeping, rousable to sternal rub, falls back asleep. No obvious distress.   HEENT: Anicteric, conjunctiva pink, lids and lashes normal. No nasal deformity, discharge, epistaxis.  Lips moist.   Skin: Warm and dry.  No jaundice.  No suspicious rashes or lesions.  Left arm bandaged, no redness spreading past elbow. Cardiac: RRR, nl S1-S2, no murmurs appreciated.  Capillary refill is brisk. Respiratory: Normal respiratory rate and rhythm.  CTAB without rales or wheezes. Abdomen: Abdomen soft.  No TTP. No ascites, distension, hepatosplenomegaly.   MSK: No deformities or effusions. Neuro: Sleeping rousable.  Speech normal.  Face symmetric.       Data Reviewed: I have personally reviewed following labs and imaging studies:  CBC:  Recent Labs Lab 02/06/17 0827 02/07/17 0247  WBC 8.3 4.5  NEUTROABS 4.6  --   HGB 12.7 11.7*  HCT 37.8 35.6*  MCV 91.5 93.2  PLT 321 298   Basic Metabolic  Panel:  Recent Labs Lab 02/06/17 0827 02/07/17 0247 02/07/17 1403  NA 137 139  --   K 3.1* 2.9*  --   CL 98* 103  --   CO2 29 26  --   GLUCOSE 106* 103*  --   BUN 13 10  --   CREATININE 0.55 0.61  --   CALCIUM 8.7* 8.1*  --   MG  --   --  1.9   GFR: Estimated Creatinine Clearance: 75.8 mL/min (by C-G formula based on SCr of 0.61 mg/dL). Liver Function Tests:  Recent Labs Lab 02/06/17 0827 02/07/17 0247  AST 92* 105*  ALT 91* 83*  ALKPHOS 57 46  BILITOT 0.8 0.7  PROT 7.6 5.5*  ALBUMIN 3.8 2.7*   No results for input(s): LIPASE, AMYLASE in the last 168 hours. No results for input(s): AMMONIA in the last 168 hours. Coagulation Profile: No results for input(s): INR, PROTIME in the last 168 hours. Cardiac Enzymes: No results for input(s):  CKTOTAL, CKMB, CKMBINDEX, TROPONINI in the last 168 hours. BNP (last 3 results) No results for input(s): PROBNP in the last 8760 hours. HbA1C: No results for input(s): HGBA1C in the last 72 hours. CBG: No results for input(s): GLUCAP in the last 168 hours. Lipid Profile: No results for input(s): CHOL, HDL, LDLCALC, TRIG, CHOLHDL, LDLDIRECT in the last 72 hours. Thyroid Function Tests: No results for input(s): TSH, T4TOTAL, FREET4, T3FREE, THYROIDAB in the last 72 hours. Anemia Panel: No results for input(s): VITAMINB12, FOLATE, FERRITIN, TIBC, IRON, RETICCTPCT in the last 72 hours. Urine analysis:    Component Value Date/Time   COLORURINE YELLOW 02/06/2017 1200   APPEARANCEUR CLOUDY (A) 02/06/2017 1200   LABSPEC >1.030 (H) 02/06/2017 1200   PHURINE 6.0 02/06/2017 1200   GLUCOSEU NEGATIVE 02/06/2017 1200   HGBUR NEGATIVE 02/06/2017 1200   BILIRUBINUR NEGATIVE 02/06/2017 1200   BILIRUBINUR Small 04/15/2014 1147   KETONESUR 15 (A) 02/06/2017 1200   PROTEINUR NEGATIVE 02/06/2017 1200   UROBILINOGEN 1.0 04/15/2014 1147   UROBILINOGEN 0.2 07/30/2012 1120   NITRITE POSITIVE (A) 02/06/2017 1200   LEUKOCYTESUR SMALL (A) 02/06/2017 1200   Sepsis Labs: @LABRCNTIP (procalcitonin:4,lacticidven:4)  ) Recent Results (from the past 240 hour(s))  Culture, blood (routine x 2)     Status: None (Preliminary result)   Collection Time: 02/06/17  7:17 PM  Result Value Ref Range Status   Specimen Description BLOOD LEFT ARM  Final   Special Requests IN PEDIATRIC BOTTLE Blood Culture adequate volume  Final   Culture NO GROWTH < 24 HOURS  Final   Report Status PENDING  Incomplete  Aerobic/Anaerobic Culture (surgical/deep wound)     Status: None (Preliminary result)   Collection Time: 02/06/17  8:47 PM  Result Value Ref Range Status   Specimen Description ABSCESS LEFT WRIST  Final   Special Requests NONE  Final   Gram Stain   Final    ABUNDANT WBC PRESENT, PREDOMINANTLY PMN NO ORGANISMS SEEN      Culture RARE STREPTOCOCCUS GROUP G  Final   Report Status PENDING  Incomplete  Aerobic/Anaerobic Culture (surgical/deep wound)     Status: None (Preliminary result)   Collection Time: 02/06/17  8:50 PM  Result Value Ref Range Status   Specimen Description TISSUE LEFT WRIST  Final   Special Requests NONE  Final   Gram Stain   Final    ABUNDANT WBC PRESENT,BOTH PMN AND MONONUCLEAR NO ORGANISMS SEEN    Culture CULTURE REINCUBATED FOR BETTER GROWTH  Final  Report Status PENDING  Incomplete         Radiology Studies: Dg Hand Complete Left  Result Date: 02/06/2017 CLINICAL DATA:  Pain and swelling EXAM: LEFT HAND - COMPLETE 3+ VIEW COMPARISON:  None. FINDINGS: Frontal, oblique, and lateral views were obtained. There is marked soft tissue swelling throughout the dorsal aspect of the hand and wrist. No acute fracture or dislocation. Joint spaces appear normal. No erosive change or bony destruction. No radiopaque foreign body. IMPRESSION: Diffuse soft tissue swelling dorsally. No fracture or dislocation. No evident arthropathy. Electronically Signed   By: Bretta Bang III M.D.   On: 02/06/2017 08:14        Scheduled Meds: . folic acid  1 mg Oral Daily  . multivitamin with minerals  1 tablet Oral Daily  . potassium chloride  40 mEq Oral BID  . sodium chloride flush  3 mL Intravenous Q12H  . thiamine  100 mg Oral Daily   Or  . thiamine  100 mg Intravenous Daily  . vitamin C  1,000 mg Oral Daily   Continuous Infusions: . sodium chloride    . lactated ringers 10 mL/hr at 02/06/17 2346  . piperacillin-tazobactam (ZOSYN)  IV 3.375 g (02/07/17 1336)  . vancomycin Stopped (02/07/17 1445)     LOS: 0 days    Time spent: 25 minutes    Alberteen Sam, MD [facility] Pager [phone]  If 7PM-7AM, please contact night-coverage [email or web address] Password [email or web address] 02/07/2017, 3:20 PM

## 2017-02-07 NOTE — Progress Notes (Signed)
Subjective: 1 Day Post-Op Procedure(s) (LRB): IRRIGATION AND DEBRIDEMENT LEFT HAND (Left) Patient  Is very sedate this morning.she appears comfortable and primarily is sleeping throughout her examination.  Objective: Vital signs in last 24 hours: Temp:  [97.5 F (36.4 C)-98.9 F (37.2 C)] 98 F (36.7 C) (10/24 0819) Pulse Rate:  [69-100] 89 (10/24 0819) Resp:  [10-20] 18 (10/24 0819) BP: (130-206)/(68-115) 130/68 (10/24 0819) SpO2:  [87 %-100 %] 100 % (10/24 0819) Weight:  [54.5 kg (120 lb 2.4 oz)] 54.5 kg (120 lb 2.4 oz) (10/23 1640)  Intake/Output from previous day: 10/23 0701 - 10/24 0700 In: 2125 [P.O.:180; I.V.:745; IV Piggyback:1200] Out: 50 [Blood:50] Intake/Output this shift: No intake/output data recorded.   Recent Labs  02/06/17 0827 02/07/17 0247  HGB 12.7 11.7*    Recent Labs  02/06/17 0827 02/07/17 0247  WBC 8.3 4.5  RBC 4.13 3.82*  HCT 37.8 35.6*  PLT 321 298    Recent Labs  02/06/17 0827 02/07/17 0247  NA 137 139  K 3.1* 2.9*  CL 98* 103  CO2 29 26  BUN 13 10  CREATININE 0.55 0.61  GLUCOSE 106* 103*  CALCIUM 8.7* 8.1*   Results for orders placed or performed during the hospital encounter of 02/06/17  Aerobic/Anaerobic Culture (surgical/deep wound)     Status: None (Preliminary result)   Collection Time: 02/06/17  8:47 PM  Result Value Ref Range Status   Specimen Description ABSCESS LEFT WRIST  Final   Special Requests NONE  Final   Gram Stain   Final    ABUNDANT WBC PRESENT, PREDOMINANTLY PMN NO ORGANISMS SEEN    Culture PENDING  Incomplete   Report Status PENDING  Incomplete  Aerobic/Anaerobic Culture (surgical/deep wound)     Status: None (Preliminary result)   Collection Time: 02/06/17  8:50 PM  Result Value Ref Range Status   Specimen Description TISSUE LEFT WRIST  Final   Special Requests NONE  Final   Gram Stain   Final    ABUNDANT WBC PRESENT,BOTH PMN AND MONONUCLEAR NO ORGANISMS SEEN    Culture PENDING  Incomplete   Report Status PENDING  Incomplete   No results for input(s): LABPT, INR in the last 72 hours.  Focused examination of the left upper extremity shows that her splint is intact, clean and dry.sensation and refill are intact to the exposed digits. She has no ascending cellulitis about the arm.she demonstrates appropriate elevation and edema control.  Assessment/Plan: 1 Day Post-Op Procedure(s) (LRB): IRRIGATION AND DEBRIDEMENT LEFT HAND (Left) I have discussed with the patient at length the severity of her infection and the need to return to the operative suite for a repeat irrigation and debridement tomorrow. She states understanding to this. Discussed with her continuation of IV antibiotics and close observation. We'll plan to return to the OR tomorrow. She will be nothing by mouth after midnight tonight. All questions were encouraged and answered.We have discussed with the patient the issues regarding their infection to the extremity. We will continue antibiotics and await culture results. Often times it will take 3-5 days for cultures to become final. During this time we will typically have the patient on intravenous antibiotics until we can find a parenteral route of antibiotic regime specific for the bacteria or organism isolated. We have discussed with the patient the need for daily irrigation and debridement as well as therapy to the area. We have discussed with the patient the necessity of range of motion to the involved joints as discussed today. We  have discussed with the patient the unpredictability of infections at times. We'll continue to work towards good pain control and restoration of function. The patient understands the need for meticulous wound care and the necessity of proper followup.  The possible complications of stiffness (loss of motion), resistant infection, possible deep bone infection, possible chronic pain issues, possible need for multiple surgeries and even amputation.  With  this in mind the patient understands our goal is to eradicate the infection to quiesence. We will continue to work towards these goals.  Tyreck Bell L 02/07/2017, 9:13 AM

## 2017-02-08 ENCOUNTER — Encounter (HOSPITAL_COMMUNITY)
Admission: EM | Discharge status: Against Medical Advice | Payer: Self-pay | Source: Home / Self Care | Attending: Family Medicine

## 2017-02-08 ENCOUNTER — Inpatient Hospital Stay (HOSPITAL_COMMUNITY): Payer: Self-pay | Admitting: Certified Registered Nurse Anesthetist

## 2017-02-08 ENCOUNTER — Encounter (HOSPITAL_COMMUNITY): Payer: Self-pay | Admitting: *Deleted

## 2017-02-08 DIAGNOSIS — F1920 Other psychoactive substance dependence, uncomplicated: Secondary | ICD-10-CM

## 2017-02-08 HISTORY — PX: I&D EXTREMITY: SHX5045

## 2017-02-08 LAB — BASIC METABOLIC PANEL
ANION GAP: 6 (ref 5–15)
BUN: 12 mg/dL (ref 6–20)
CALCIUM: 8 mg/dL — AB (ref 8.9–10.3)
CO2: 26 mmol/L (ref 22–32)
Chloride: 108 mmol/L (ref 101–111)
Creatinine, Ser: 0.52 mg/dL (ref 0.44–1.00)
GLUCOSE: 110 mg/dL — AB (ref 65–99)
POTASSIUM: 4.2 mmol/L (ref 3.5–5.1)
SODIUM: 140 mmol/L (ref 135–145)

## 2017-02-08 LAB — VANCOMYCIN, TROUGH: VANCOMYCIN TR: 6 ug/mL — AB (ref 15–20)

## 2017-02-08 SURGERY — IRRIGATION AND DEBRIDEMENT EXTREMITY
Anesthesia: General | Laterality: Left

## 2017-02-08 MED ORDER — LIDOCAINE HCL (CARDIAC) 20 MG/ML IV SOLN
INTRAVENOUS | Status: DC | PRN
  Administered 2017-02-08: 60 mg via INTRAVENOUS

## 2017-02-08 MED ORDER — ONDANSETRON HCL 4 MG/2ML IJ SOLN
INTRAMUSCULAR | Status: DC | PRN
  Administered 2017-02-08: 4 mg via INTRAVENOUS

## 2017-02-08 MED ORDER — BUPIVACAINE HCL (PF) 0.25 % IJ SOLN
INTRAMUSCULAR | Status: AC
  Filled 2017-02-08: qty 30

## 2017-02-08 MED ORDER — MIDAZOLAM HCL 2 MG/2ML IJ SOLN
INTRAMUSCULAR | Status: AC
  Filled 2017-02-08: qty 2

## 2017-02-08 MED ORDER — VANCOMYCIN HCL IN DEXTROSE 750-5 MG/150ML-% IV SOLN
750.0000 mg | Freq: Three times a day (TID) | INTRAVENOUS | Status: DC
  Administered 2017-02-08 – 2017-02-09 (×5): 750 mg via INTRAVENOUS
  Filled 2017-02-08 (×6): qty 150

## 2017-02-08 MED ORDER — MIDAZOLAM HCL 5 MG/5ML IJ SOLN
INTRAMUSCULAR | Status: DC | PRN
  Administered 2017-02-08: 2 mg via INTRAVENOUS

## 2017-02-08 MED ORDER — LACTATED RINGERS IV SOLN
INTRAVENOUS | Status: DC
  Administered 2017-02-08: 15:00:00 via INTRAVENOUS

## 2017-02-08 MED ORDER — PROPOFOL 10 MG/ML IV BOLUS
INTRAVENOUS | Status: DC | PRN
  Administered 2017-02-08: 150 mg via INTRAVENOUS

## 2017-02-08 MED ORDER — 0.9 % SODIUM CHLORIDE (POUR BTL) OPTIME
TOPICAL | Status: DC | PRN
  Administered 2017-02-08: 1000 mL

## 2017-02-08 MED ORDER — FENTANYL CITRATE (PF) 100 MCG/2ML IJ SOLN: 25.0000 ug | INTRAMUSCULAR | Status: DC | PRN

## 2017-02-08 MED ORDER — FENTANYL CITRATE (PF) 100 MCG/2ML IJ SOLN
INTRAMUSCULAR | Status: DC | PRN
  Administered 2017-02-08 (×2): 25 ug via INTRAVENOUS
  Administered 2017-02-08: 75 ug via INTRAVENOUS

## 2017-02-08 MED ORDER — LACTATED RINGERS IV SOLN
INTRAVENOUS | Status: DC | PRN
  Administered 2017-02-08: 17:00:00 via INTRAVENOUS

## 2017-02-08 MED ORDER — FENTANYL CITRATE (PF) 250 MCG/5ML IJ SOLN
INTRAMUSCULAR | Status: AC
  Filled 2017-02-08: qty 5

## 2017-02-08 MED ORDER — SODIUM CHLORIDE 0.9 % IR SOLN
Status: DC | PRN
  Administered 2017-02-08: 3000 mL

## 2017-02-08 SURGICAL SUPPLY — 43 items
BANDAGE ACE 3X5.8 VEL STRL LF (GAUZE/BANDAGES/DRESSINGS) ×3 IMPLANT
BANDAGE ACE 4X5 VEL STRL LF (GAUZE/BANDAGES/DRESSINGS) IMPLANT
BNDG CONFORM 2 STRL LF (GAUZE/BANDAGES/DRESSINGS) ×3 IMPLANT
BNDG GAUZE ELAST 4 BULKY (GAUZE/BANDAGES/DRESSINGS) ×3 IMPLANT
CORDS BIPOLAR (ELECTRODE) ×3 IMPLANT
COVER SURGICAL LIGHT HANDLE (MISCELLANEOUS) ×3 IMPLANT
CUFF TOURNIQUET SINGLE 18IN (TOURNIQUET CUFF) ×3 IMPLANT
CUFF TOURNIQUET SINGLE 24IN (TOURNIQUET CUFF) IMPLANT
DRAIN PENROSE 18X1/4 LTX STRL (WOUND CARE) IMPLANT
DRAIN TLS ROUND 10FR (DRAIN) ×3 IMPLANT
DRSG ADAPTIC 3X8 NADH LF (GAUZE/BANDAGES/DRESSINGS) ×3 IMPLANT
GAUZE SPONGE 4X4 12PLY STRL (GAUZE/BANDAGES/DRESSINGS) ×3 IMPLANT
GAUZE XEROFORM 1X8 LF (GAUZE/BANDAGES/DRESSINGS) IMPLANT
GAUZE XEROFORM 5X9 LF (GAUZE/BANDAGES/DRESSINGS) ×3 IMPLANT
GLOVE BIOGEL M 8.0 STRL (GLOVE) ×3 IMPLANT
GLOVE SS BIOGEL STRL SZ 8 (GLOVE) ×1 IMPLANT
GLOVE SUPERSENSE BIOGEL SZ 8 (GLOVE) ×2
GOWN STRL REUS W/ TWL LRG LVL3 (GOWN DISPOSABLE) ×1 IMPLANT
GOWN STRL REUS W/ TWL XL LVL3 (GOWN DISPOSABLE) ×2 IMPLANT
GOWN STRL REUS W/TWL LRG LVL3 (GOWN DISPOSABLE) ×2
GOWN STRL REUS W/TWL XL LVL3 (GOWN DISPOSABLE) ×4
KIT BASIN OR (CUSTOM PROCEDURE TRAY) ×3 IMPLANT
KIT ROOM TURNOVER OR (KITS) ×3 IMPLANT
MANIFOLD NEPTUNE II (INSTRUMENTS) ×3 IMPLANT
NEEDLE HYPO 25GX1X1/2 BEV (NEEDLE) IMPLANT
NS IRRIG 1000ML POUR BTL (IV SOLUTION) ×3 IMPLANT
PACK ORTHO EXTREMITY (CUSTOM PROCEDURE TRAY) ×3 IMPLANT
PAD ARMBOARD 7.5X6 YLW CONV (MISCELLANEOUS) ×3 IMPLANT
PAD CAST 4YDX4 CTTN HI CHSV (CAST SUPPLIES) ×3 IMPLANT
PADDING CAST COTTON 4X4 STRL (CAST SUPPLIES) ×6
SCRUB BETADINE 4OZ XXX (MISCELLANEOUS) ×3 IMPLANT
SOL PREP POV-IOD 4OZ 10% (MISCELLANEOUS) ×3 IMPLANT
SPLINT FIBERGLASS 3X12 (CAST SUPPLIES) ×3 IMPLANT
SPONGE LAP 4X18 X RAY DECT (DISPOSABLE) IMPLANT
SUT PROLENE 3 0 PS 1 (SUTURE) ×9 IMPLANT
SWAB CULTURE ESWAB REG 1ML (MISCELLANEOUS) IMPLANT
SYR CONTROL 10ML LL (SYRINGE) IMPLANT
TOWEL OR 17X24 6PK STRL BLUE (TOWEL DISPOSABLE) IMPLANT
TOWEL OR 17X26 10 PK STRL BLUE (TOWEL DISPOSABLE) IMPLANT
TUBE CONNECTING 12'X1/4 (SUCTIONS) ×1
TUBE CONNECTING 12X1/4 (SUCTIONS) ×2 IMPLANT
WATER STERILE IRR 1000ML POUR (IV SOLUTION) IMPLANT
YANKAUER SUCT BULB TIP NO VENT (SUCTIONS) ×3 IMPLANT

## 2017-02-08 NOTE — Anesthesia Preprocedure Evaluation (Signed)
Anesthesia Evaluation  Patient identified by MRN, date of birth, ID band Patient awake    Reviewed: Allergy & Precautions, NPO status , Patient's Chart, lab work & pertinent test results  Airway Mallampati: II  TM Distance: >3 FB     Dental   Pulmonary pneumonia, Current Smoker,    breath sounds clear to auscultation       Cardiovascular negative cardio ROS   Rhythm:Regular     Neuro/Psych  Headaches,    GI/Hepatic negative GI ROS, Neg liver ROS,   Endo/Other  negative endocrine ROS  Renal/GU negative Renal ROS     Musculoskeletal   Abdominal   Peds  Hematology   Anesthesia Other Findings   Reproductive/Obstetrics                             Anesthesia Physical Anesthesia Plan  ASA: III  Anesthesia Plan: General   Post-op Pain Management:    Induction: Intravenous  PONV Risk Score and Plan: 2 and Ondansetron, Dexamethasone, Treatment may vary due to age or medical condition and Propofol infusion  Airway Management Planned: LMA  Additional Equipment:   Intra-op Plan:   Post-operative Plan: Extubation in OR  Informed Consent: I have reviewed the patients History and Physical, chart, labs and discussed the procedure including the risks, benefits and alternatives for the proposed anesthesia with the patient or authorized representative who has indicated his/her understanding and acceptance.   Dental advisory given  Plan Discussed with: CRNA and Anesthesiologist  Anesthesia Plan Comments:         Anesthesia Quick Evaluation

## 2017-02-08 NOTE — Transfer of Care (Signed)
Immediate Anesthesia Transfer of Care Note  Patient: Madison Garza  Procedure(s) Performed: REPEAT IRRIGATION AND DEBRIDEMENT LEFT HAND (Left )  Patient Location: PACU  Anesthesia Type:General  Level of Consciousness: awake, alert , oriented and patient cooperative  Airway & Oxygen Therapy: Patient Spontanous Breathing and Patient connected to nasal cannula oxygen  Post-op Assessment: Report given to RN, Post -op Vital signs reviewed and stable and Patient moving all extremities X 4  Post vital signs: Reviewed and stable  Last Vitals:  Vitals:   02/08/17 0445 02/08/17 1402  BP: 117/65 127/90  Pulse: 82 74  Resp: 18 18  Temp: 36.9 C 37 C  SpO2: 94% 97%    Last Pain:  Vitals:   02/08/17 1402  TempSrc: Oral  PainSc:       Patients Stated Pain Goal: 4 (02/07/17 1808)  Complications: No apparent anesthesia complications

## 2017-02-08 NOTE — Op Note (Signed)
NAMMarland Kitchen:  Soundra PilonFOSTER, Jozey                ACCOUNT NO.:  [id number]  MEDICAL RECORD NO.:  [id number]  LOCATION:                                 FACILITY:  PHYSICIAN:  Dionne AnoWilliam M. Amanda PeaGramig, M.D.     DATE OF BIRTH:  DATE OF PROCEDURE: DATE OF DISCHARGE:                              OPERATIVE REPORT   PREOPERATIVE DIAGNOSIS:  Left hand abscess with infectious tenosynovitis.  POSTOPERATIVE DIAGNOSIS:  Left hand abscess with infectious tenosynovitis.  PROCEDURES: 1. Irrigation and debridement, deep abscess, left hand. 2. Radical tenolysis with tenosynovectomy, 2nd, 3rd, 4th, and 5th     dorsal compartments, left hand. 3. Fasciotomy, left forearm. 4. Exploration and tying off venous channels secondary to clotted hair     wound debris within them.  SURGEON:  Dionne AnoWilliam M. Amanda PeaGramig, MD.  ASSISTANT:  None.  COMPLICATIONS:  None.  ANESTHESIA:  General.  TOURNIQUET TIME:  Less than an hour.  INDICATIONS FOR PROCEDURE:  The patient is a challenging white female with left hand abscess secondary to IV drug abuse.  She was admitted and I was consulted for evaluation and care.  She was taken to the operative theater after a thorough discussion for surgical intervention.  I have discussed the risks and benefits, do's and don'ts, timeframe, duration of recovery, and other issues remain to the predicament.  OPERATION IN DETAIL:  The patient was seen by myself and Anesthesia, taken to the operative theater and underwent a general anesthetic.  She was counseled.  Arm was marked.  She was prepped and draped with Hibiclens scrub x2 followed by a 10-minute surgical Betadine scrub. Once this was complete, I made a small incision over the dorsal aspect of the hand.  Dissection was carried down.  Irrigation and debridement of a deep abscess were accomplished.  I took fluid cultures, aerobic, anaerobic and following this noted an array of abnormalities.  She had advanced tenosynovitis, infectious in nature  about the dorsal compartment.  This prompted a larger incision and wider exposure.  It was quite clear that she had marked infectious tenosynovitis about the 2nd, 3rd, 4th, and 5th dorsal compartments.  This required a wide exposure.  At this time, I performed a wide exposure and explored the venous system.  I tied off venous channels where they were clotted from heroin debris.  This was done on the dorsal aspect of the hand. Following this, we then very carefully and cautiously dissected proximally where fasciotomy was carried out.  I then identified a rupture of the EDC to the index finger.  This was an attritional rupture secondary to infection.  At this time, I had to perform a 3rd, 2nd, 4th, and 5th tenolysis, tenosynovectomy, and removal of large amounts of thickened infectious tenosynovium.  All tendons were very carefully handled and removed of any nonviable necrotic tissue.  Unfortunately, the tendons were markedly attenuated and stretched out losing their collagen, tensile, strength in my opinion due to the infectious process.  This gave the tendons almost a wavy look, which was very unhealthy looking.  At this time, I made sure all debris was removed and then performed a very careful and cautious irrigation process.  The patient had 3 L of irrigant placed, Penrose drains placed followed by a loose closure with 1 stitch. Adaptic, Xeroform, 4 x 4 gauze, and other measures were applied.  She was started on vancomycin and Zosyn.  We will see how things move forward with her.  She will need to return to the operative theater in 48 hours for additional washout and possible closure if wound conditions improve.  I cannot overstate how significant the infectious process was.  The EDC to the index finger was completely ruptured.  The remaining fingers were intact, but stretched out and poor collagen quality in my opinion.  We will monitor condition closely, see her back daily and of  course return to the operative theater as described.  Do's and don'ts have been discussed and all questions were encouraged and answered.     Dionne Ano. Amanda Pea, M.D.   ______________________________ Dionne Ano. Amanda Pea, M.D.    [facility]  D:  02/06/2017  T:  02/06/2017  Job:  [id number]

## 2017-02-08 NOTE — Progress Notes (Signed)
PROGRESS NOTE    Madison ShorterDanae A Garza  ZOX:[id number] DOB: Aug 17, 1975 DOA: 02/06/2017 PCP: System, Provider Not In      Brief Narrative:  41 yo F with IVDU, hep C who presents with hand pain and swelling for 1 week, found to have purulent cellulitis of the hand. Taken to the OR by Dr. Amanda PeaGramig on 10/23, debrided, intra-op cultures sent.     Assessment & Plan:  Principal Problem:   Cellulitis and abscess of hand Active Problems:   Polysubstance dependence (HCC)   Anxiety disorder   Cellulitis of both feet   Cellulitis of right hand   Cellulitis of hand, left   Hand cellulitis and abscess Hand culture only rare Group G strep so far, re-incubated today.  Blood cultures negative.  -Vancomycin and Zosyn per Ortho, likely narrow to cefazolin if no further growth -Repeat blood cultures today -Follow culture data -Toradol or oxycodone for pain   Hepatitis C Transaminases mildly up.  Untreated -Outpatient referral to GI  Alcohol abuse -Continue CIWA for now       DVT prophylaxis: SCDs Code Status: FULL Family Communication: None present Disposition Plan: Per orthopedics   Consultants:   Orthopedics  Procedures:  irrigation and debridement deep abscess left hand dorsal, extensive/radical tenolysis tenosynovectomy of the extensor apparatus including the second through fifth compartments on 10/23  Antimicrobials:   Vancomycin 10/23 >>  Zosyn 10/23 >>    Cultures: Abscess 10/23 >> Rare group G strep Tissue 10/23 >> NG Blood x1 10/23 >> NG   Subjective: Sleeping, rousable, falls back asleep.  Complains of hand pain.    Objective: Vitals:   02/07/17 1329 02/07/17 1630 02/07/17 2130 02/08/17 0445  BP: 120/67 140/69 121/62 117/65  Pulse: 70 95 93 82  Resp: 18 18 18 18   Temp: 98 F (36.7 C) 99.2 F (37.3 C) 98.7 F (37.1 C) 98.5 F (36.9 C)  TempSrc: Oral Oral Oral Oral  SpO2: 100% 96% 94% 94%  Weight:      Height:        Intake/Output Summary (Last  24 hours) at 02/08/17 1317 Last data filed at 02/08/17 0447  Gross per 24 hour  Intake          1387.33 ml  Output              120 ml  Net          1267.33 ml   Filed Weights   02/06/17 0739 02/06/17 1640  Weight: 54.4 kg (120 lb) 54.5 kg (120 lb 2.4 oz)    Examination: General appearance: Well nourished adult female, sleeping, rousable to voice, sleepy, does not follow commands well given sedation Skin: Warm and dry.  No jaundice.  No suspicious rashes or lesions.  Left arm bandaged, no redness spreading past elbow. Cardiac: RRR, nl S1-S2, no murmurs appreciated.   Respiratory: Normal respiratory rate and rhythm.  CTAB without rales or wheezes. Abdomen: Abdomen soft.  No TTP. No ascites, distension, hepatosplenomegaly.   Neuro: Sleepy, rousable.  Sedated.     Data Reviewed: I have personally reviewed following labs and imaging studies:  CBC:  Recent Labs Lab 02/06/17 0827 02/07/17 0247  WBC 8.3 4.5  NEUTROABS 4.6  --   HGB 12.7 11.7*  HCT 37.8 35.6*  MCV 91.5 93.2  PLT 321 298   Basic Metabolic Panel:  Recent Labs Lab 02/06/17 0827 02/07/17 0247 02/07/17 1403 02/08/17 0512  NA 137 139  --  140  K 3.1* 2.9*  --  4.2  CL 98* 103  --  108  CO2 29 26  --  26  GLUCOSE 106* 103*  --  110*  BUN 13 10  --  12  CREATININE 0.55 0.61  --  0.52  CALCIUM 8.7* 8.1*  --  8.0*  MG  --   --  1.9  --    GFR: Estimated Creatinine Clearance: 75.8 mL/min (by C-G formula based on SCr of 0.52 mg/dL). Liver Function Tests:  Recent Labs Lab 02/06/17 0827 02/07/17 0247  AST 92* 105*  ALT 91* 83*  ALKPHOS 57 46  BILITOT 0.8 0.7  PROT 7.6 5.5*  ALBUMIN 3.8 2.7*   No results for input(s): LIPASE, AMYLASE in the last 168 hours. No results for input(s): AMMONIA in the last 168 hours. Coagulation Profile: No results for input(s): INR, PROTIME in the last 168 hours. Cardiac Enzymes: No results for input(s): CKTOTAL, CKMB, CKMBINDEX, TROPONINI in the last 168 hours. BNP  (last 3 results) No results for input(s): PROBNP in the last 8760 hours. HbA1C: No results for input(s): HGBA1C in the last 72 hours. CBG: No results for input(s): GLUCAP in the last 168 hours. Lipid Profile: No results for input(s): CHOL, HDL, LDLCALC, TRIG, CHOLHDL, LDLDIRECT in the last 72 hours. Thyroid Function Tests: No results for input(s): TSH, T4TOTAL, FREET4, T3FREE, THYROIDAB in the last 72 hours. Anemia Panel: No results for input(s): VITAMINB12, FOLATE, FERRITIN, TIBC, IRON, RETICCTPCT in the last 72 hours. Urine analysis:    Component Value Date/Time   COLORURINE YELLOW 02/06/2017 1200   APPEARANCEUR CLOUDY (A) 02/06/2017 1200   LABSPEC >1.030 (H) 02/06/2017 1200   PHURINE 6.0 02/06/2017 1200   GLUCOSEU NEGATIVE 02/06/2017 1200   HGBUR NEGATIVE 02/06/2017 1200   BILIRUBINUR NEGATIVE 02/06/2017 1200   BILIRUBINUR Small 04/15/2014 1147   KETONESUR 15 (A) 02/06/2017 1200   PROTEINUR NEGATIVE 02/06/2017 1200   UROBILINOGEN 1.0 04/15/2014 1147   UROBILINOGEN 0.2 07/30/2012 1120   NITRITE POSITIVE (A) 02/06/2017 1200   LEUKOCYTESUR SMALL (A) 02/06/2017 1200   Sepsis Labs: @LABRCNTIP (procalcitonin:4,lacticidven:4)  ) Recent Results (from the past 240 hour(s))  Culture, blood (routine x 2)     Status: None (Preliminary result)   Collection Time: 02/06/17  7:17 PM  Result Value Ref Range Status   Specimen Description BLOOD LEFT ARM  Final   Special Requests IN PEDIATRIC BOTTLE Blood Culture adequate volume  Final   Culture NO GROWTH < 24 HOURS  Final   Report Status PENDING  Incomplete  Aerobic/Anaerobic Culture (surgical/deep wound)     Status: None (Preliminary result)   Collection Time: 02/06/17  8:47 PM  Result Value Ref Range Status   Specimen Description ABSCESS LEFT WRIST  Final   Special Requests NONE  Final   Gram Stain   Final    ABUNDANT WBC PRESENT, PREDOMINANTLY PMN NO ORGANISMS SEEN    Culture RARE STREPTOCOCCUS GROUP G  Final   Report Status  PENDING  Incomplete  Aerobic/Anaerobic Culture (surgical/deep wound)     Status: None (Preliminary result)   Collection Time: 02/06/17  8:50 PM  Result Value Ref Range Status   Specimen Description TISSUE LEFT WRIST  Final   Special Requests NONE  Final   Gram Stain   Final    ABUNDANT WBC PRESENT,BOTH PMN AND MONONUCLEAR NO ORGANISMS SEEN    Culture CULTURE REINCUBATED FOR BETTER GROWTH  Final   Report Status PENDING  Incomplete         Radiology Studies: No  results found.      Scheduled Meds: . folic acid  1 mg Oral Daily  . multivitamin with minerals  1 tablet Oral Daily  . sodium chloride flush  3 mL Intravenous Q12H  . thiamine  100 mg Oral Daily   Or  . thiamine  100 mg Intravenous Daily  . vitamin C  1,000 mg Oral Daily   Continuous Infusions: . sodium chloride    . lactated ringers 10 mL/hr at 02/06/17 2346  . piperacillin-tazobactam (ZOSYN)  IV 3.375 g (02/08/17 1310)  . vancomycin 750 mg (02/08/17 1310)     LOS: 1 day    Time spent: 25 minutes    Alberteen Sam, MD [facility] Pager [phone]  If 7PM-7AM, please contact night-coverage [email or web address] Password [email or web address] 02/08/2017, 1:17 PM

## 2017-02-08 NOTE — Anesthesia Procedure Notes (Signed)
Procedure Name: LMA Insertion Date/Time: 02/08/2017 5:40 PM Performed by: Lodema HongSIMPSON, Margot Oriordan ANN Pre-anesthesia Checklist: Patient identified, Emergency Drugs available, Suction available and Patient being monitored Patient Re-evaluated:Patient Re-evaluated prior to induction Oxygen Delivery Method: Circle system utilized Preoxygenation: Pre-oxygenation with 100% oxygen Induction Type: IV induction Ventilation: Mask ventilation without difficulty LMA: LMA inserted LMA Size: 4.0 Number of attempts: 1 Tube secured with: Tape

## 2017-02-08 NOTE — H&P (Signed)
Plan repeat I&D  All questions answered We are planning surgery for your upper extremity. The risk and benefits of surgery to include risk of bleeding, infection, anesthesia,  damage to normal structures and failure of the surgery to accomplish its intended goals of relieving symptoms and restoring function have been discussed in detail. With this in mind we plan to proceed. I have specifically discussed with the patient the pre-and postoperative regime and the dos and don'ts and risk and benefits in great detail. Risk and benefits of surgery also include risk of dystrophy(CRPS), chronic nerve pain, failure of the healing process to go onto completion and other inherent risks of surgery The relavent the pathophysiology of the disease/injury process, as well as the alternatives for treatment and postoperative course of action has been discussed in great detail with the patient who desires to proceed.  We will do everything in our power to help you (the patient) restore function to the upper extremity. It is a pleasure to see this patient today.  Penelopi Mikrut MD

## 2017-02-08 NOTE — Op Note (Signed)
See op [id number] Weslynn Ke MD

## 2017-02-08 NOTE — Progress Notes (Signed)
Pharmacy Antibiotic Note  Madison Garza is a 41 y.o. female admitted on 02/06/2017 with wound infection.  Pharmacy has been consulted for Vancomycin dosing.   Vancomycin trough this AM is low at 6, Scr stable  Plan: -Inc vancomycin to 750 mg IV q8h -Re-check trough at new steady state  Height: 5\' 3"  (160 cm) Weight: 120 lb 2.4 oz (54.5 kg) IBW/kg (Calculated) : 52.4  Temp (24hrs), Avg:98.5 F (36.9 C), Min:98 F (36.7 C), Max:99.2 F (37.3 C)   Recent Labs Lab 02/06/17 0827 02/06/17 0839 02/07/17 0247 02/08/17 0512  WBC 8.3  --  4.5  --   CREATININE 0.55  --  0.61 0.52  LATICACIDVEN  --  0.90  --   --   VANCOTROUGH  --   --   --  6*    Estimated Creatinine Clearance: 75.8 mL/min (by C-G formula based on SCr of 0.52 mg/dL).    No Known Allergies  Madison Garza, Madison Garza 02/08/2017 6:31 AM

## 2017-02-08 NOTE — Anesthesia Procedure Notes (Deleted)
Performed by: Ron AgeeSIMPSON, Marla Pouliot ANN

## 2017-02-08 NOTE — Progress Notes (Signed)
Pt complaining of pain unrelieved by 10mg  Oxycodone and 30mg  Toradol given at 1925. Pt also receive 1mg  Ativan IV at that time. Pt is crying and yelling. MD paged.

## 2017-02-08 NOTE — Progress Notes (Signed)
Paged MD regarding patient's pain medication. Pt complaining of pain unrelieved by 10mg  Oxycodone and 30mg  Toradol given at 1925. Patient also received 1mg  Ativan at 1925.

## 2017-02-09 ENCOUNTER — Encounter (HOSPITAL_COMMUNITY): Payer: Self-pay | Admitting: Orthopedic Surgery

## 2017-02-09 DIAGNOSIS — F411 Generalized anxiety disorder: Secondary | ICD-10-CM

## 2017-02-09 MED ORDER — CEPHALEXIN 500 MG PO CAPS: 500.0000 mg | ORAL_CAPSULE | Freq: Three times a day (TID) | ORAL | 0 refills | Status: AC

## 2017-02-09 NOTE — Progress Notes (Signed)
PROGRESS NOTE    Madison Garza  GNF:[id number] DOB: January 05, 1976 DOA: 02/06/2017 PCP: System, Provider Not In      Brief Narrative:  41 yo F with IVDU, hep C who presents with hand pain and swelling for 1 week, found to have purulent cellulitis of the hand. Taken to the OR by Dr. Amanda Pea on 10/23, debrided, intra-op cultures sent.     Assessment & Plan:  Principal Problem:   Cellulitis and abscess of hand Active Problems:   Polysubstance dependence (HCC)   Anxiety disorder   Cellulitis of both feet   Cellulitis of right hand   Cellulitis of hand, left   Hand cellulitis and abscess Hand culture rare Group G strep and beta-hemolytic strep NOS, ID pending.  Blood cultures negative x3.   -Vancomycin and Zosyn per Ortho, narrow pending cultures -Follow culture data -Toradol or oxycodone for pain    Hepatitis C Transaminases mildly up.  Untreated -Outpatient referral to GI  Alcohol abuse Scores 12 and 13 on CIWA overnight. Alcohol withdrawal may delay discharge. -Continue CIWA for now       DVT prophylaxis: SCDs Code Status: FULL Family Communication: None present Disposition Plan: Per orthopedics   Consultants:   Orthopedics  Procedures:  irrigation and debridement deep abscess left hand dorsal, extensive/radical tenolysis tenosynovectomy of the extensor apparatus including the second through fifth compartments on 10/23  Antimicrobials:   Vancomycin 10/23 >>  Zosyn 10/23 >>    Cultures: Abscess 10/23 >> Rare group G strep and beta-Hemolyt strep NOS Tissue 10/23 >> NG Blood x1 10/23 >> NG Blood cx x2 10/25 >> NG  Subjective: Sleeping, rousable, falls back asleep.  Complains of hand pain.  Confused.    Objective: Vitals:   02/08/17 1848 02/08/17 2110 02/09/17 0454 02/09/17 1045  BP:  120/82 114/74 120/68  Pulse: (!) 102 (!) 106 88 89  Resp: 16 16 16 18   Temp: (!) 97 F (36.1 C) 98.7 F (37.1 C) 98.3 F (36.8 C) 98.3 F (36.8 C)  TempSrc:   Oral Oral Oral  SpO2: 98% 96% 94% 98%  Weight:      Height:        Intake/Output Summary (Last 24 hours) at 02/09/17 1231 Last data filed at 02/09/17 0930  Gross per 24 hour  Intake          1677.17 ml  Output               30 ml  Net          1647.17 ml   Filed Weights   02/06/17 0739 02/06/17 1640 02/08/17 1525  Weight: 54.4 kg (120 lb) 54.5 kg (120 lb 2.4 oz) 54.4 kg (120 lb)    Examination: General appearance: Well nourished adult female, standing around, appears very sleepy, not very alert, sedated.  Oriented to place, situation, just can't remember any details of her surgery. Skin: Warm and dry.  No jaundice.  No suspicious rashes or lesions.  Left arm bandaged, no redness spreading past elbow. Cardiac: RRR, nl S1-S2, no murmurs appreciated.   Respiratory: Normal respiratory rate and rhythm.  CTAB without rales or wheezes. Abdomen: Abdomen soft.  No TTP. No ascites, distension, hepatosplenomegaly.   Neuro: Sleepy, rousable.  Sedated.  Oriented.     Data Reviewed: I have personally reviewed following labs and imaging studies:  CBC:  Recent Labs Lab 02/06/17 0827 02/07/17 0247  WBC 8.3 4.5  NEUTROABS 4.6  --   HGB 12.7 11.7*  HCT  37.8 35.6*  MCV 91.5 93.2  PLT 321 298   Basic Metabolic Panel:  Recent Labs Lab 02/06/17 0827 02/07/17 0247 02/07/17 1403 02/08/17 0512  NA 137 139  --  140  K 3.1* 2.9*  --  4.2  CL 98* 103  --  108  CO2 29 26  --  26  GLUCOSE 106* 103*  --  110*  BUN 13 10  --  12  CREATININE 0.55 0.61  --  0.52  CALCIUM 8.7* 8.1*  --  8.0*  MG  --   --  1.9  --    GFR: Estimated Creatinine Clearance: 75.8 mL/min (by C-G formula based on SCr of 0.52 mg/dL). Liver Function Tests:  Recent Labs Lab 02/06/17 0827 02/07/17 0247  AST 92* 105*  ALT 91* 83*  ALKPHOS 57 46  BILITOT 0.8 0.7  PROT 7.6 5.5*  ALBUMIN 3.8 2.7*   No results for input(s): LIPASE, AMYLASE in the last 168 hours. No results for input(s): AMMONIA in the last  168 hours. Coagulation Profile: No results for input(s): INR, PROTIME in the last 168 hours. Cardiac Enzymes: No results for input(s): CKTOTAL, CKMB, CKMBINDEX, TROPONINI in the last 168 hours. BNP (last 3 results) No results for input(s): PROBNP in the last 8760 hours. HbA1C: No results for input(s): HGBA1C in the last 72 hours. CBG: No results for input(s): GLUCAP in the last 168 hours. Lipid Profile: No results for input(s): CHOL, HDL, LDLCALC, TRIG, CHOLHDL, LDLDIRECT in the last 72 hours. Thyroid Function Tests: No results for input(s): TSH, T4TOTAL, FREET4, T3FREE, THYROIDAB in the last 72 hours. Anemia Panel: No results for input(s): VITAMINB12, FOLATE, FERRITIN, TIBC, IRON, RETICCTPCT in the last 72 hours. Urine analysis:    Component Value Date/Time   COLORURINE YELLOW 02/06/2017 1200   APPEARANCEUR CLOUDY (A) 02/06/2017 1200   LABSPEC >1.030 (H) 02/06/2017 1200   PHURINE 6.0 02/06/2017 1200   GLUCOSEU NEGATIVE 02/06/2017 1200   HGBUR NEGATIVE 02/06/2017 1200   BILIRUBINUR NEGATIVE 02/06/2017 1200   BILIRUBINUR Small 04/15/2014 1147   KETONESUR 15 (A) 02/06/2017 1200   PROTEINUR NEGATIVE 02/06/2017 1200   UROBILINOGEN 1.0 04/15/2014 1147   UROBILINOGEN 0.2 07/30/2012 1120   NITRITE POSITIVE (A) 02/06/2017 1200   LEUKOCYTESUR SMALL (A) 02/06/2017 1200   Sepsis Labs: @LABRCNTIP (procalcitonin:4,lacticidven:4)  ) Recent Results (from the past 240 hour(s))  Culture, blood (routine x 2)     Status: None (Preliminary result)   Collection Time: 02/06/17  7:17 PM  Result Value Ref Range Status   Specimen Description BLOOD LEFT ARM  Final   Special Requests IN PEDIATRIC BOTTLE Blood Culture adequate volume  Final   Culture NO GROWTH 3 DAYS  Final   Report Status PENDING  Incomplete  Aerobic/Anaerobic Culture (surgical/deep wound)     Status: None (Preliminary result)   Collection Time: 02/06/17  8:47 PM  Result Value Ref Range Status   Specimen Description ABSCESS  LEFT WRIST  Final   Special Requests NONE  Final   Gram Stain   Final    ABUNDANT WBC PRESENT, PREDOMINANTLY PMN NO ORGANISMS SEEN    Culture   Final    RARE STREPTOCOCCUS GROUP G NO ANAEROBES ISOLATED; CULTURE IN PROGRESS FOR 5 DAYS    Report Status PENDING  Incomplete  Aerobic/Anaerobic Culture (surgical/deep wound)     Status: None (Preliminary result)   Collection Time: 02/06/17  8:50 PM  Result Value Ref Range Status   Specimen Description TISSUE LEFT WRIST  Final  Special Requests NONE  Final   Gram Stain   Final    ABUNDANT WBC PRESENT,BOTH PMN AND MONONUCLEAR NO ORGANISMS SEEN    Culture   Final    RARE BETA HEMOLYTIC ORGANISM BEING IDENTIFIED IDENTIFICATION TO FOLLOW NO ANAEROBES ISOLATED; CULTURE IN PROGRESS FOR 5 DAYS    Report Status PENDING  Incomplete  Culture, blood (routine x 2)     Status: None (Preliminary result)   Collection Time: 02/08/17  2:40 PM  Result Value Ref Range Status   Specimen Description BLOOD RIGHT HAND  Final   Special Requests IN PEDIATRIC BOTTLE Blood Culture adequate volume  Final   Culture NO GROWTH < 24 HOURS  Final   Report Status PENDING  Incomplete  Culture, blood (routine x 2)     Status: None (Preliminary result)   Collection Time: 02/08/17  2:49 PM  Result Value Ref Range Status   Specimen Description BLOOD RIGHT HAND  Final   Special Requests IN PEDIATRIC BOTTLE Blood Culture adequate volume  Final   Culture NO GROWTH < 24 HOURS  Final   Report Status PENDING  Incomplete         Radiology Studies: No results found.      Scheduled Meds: . folic acid  1 mg Oral Daily  . multivitamin with minerals  1 tablet Oral Daily  . sodium chloride flush  3 mL Intravenous Q12H  . thiamine  100 mg Oral Daily   Or  . thiamine  100 mg Intravenous Daily  . vitamin C  1,000 mg Oral Daily   Continuous Infusions: . sodium chloride    . lactated ringers 10 mL/hr at 02/06/17 2346  . lactated ringers 10 mL/hr at 02/08/17 1527    . piperacillin-tazobactam (ZOSYN)  IV Stopped (02/09/17 1037)  . vancomycin Stopped (02/09/17 0739)     LOS: 2 days    Time spent: 20 minutes    Alberteen Sam, MD [facility] Pager [phone]  If 7PM-7AM, please contact night-coverage [email or web address] Password [email or web address] 02/09/2017, 12:31 PM

## 2017-02-09 NOTE — Discharge Summary (Signed)
Physician Discharge Summary  Madison Garza:[id number] DOB: 1975-10-31 DOA: 02/06/2017  PCP: System, Provider Not In  Admit date: 02/06/2017 Discharge date: 02/09/2017  Admitted From: Home Disposition:  THE PATIENT LEFT AMA  Recommendations for Outpatient Follow-up:  1. Follow up with Dr. Amanda Pea in 2 weeks   Discharge Condition: Improving CODE STATUS: FULL Diet recommendation: Regular  Brief/Interim Summary: The patient is a 41 yo F with polysubstance abuse intravenously who presented with abscessa nd cellulitis of the left hand.  She underwent irrigation and debridement x2 by Dr. Amanda Pea.  Intraoperative cultures were growing different streptococcal species, not yet speciated at the time of discharge.  She had her drain removed today and plans for follow up in 2 weeks with Dr. Amanda Pea.  However, this afternoon, she complained that she was ready to leave, was unhappy about being "left in a corner and ignored" (in truth, she was the first room next to the nurses station, attended by two physicians all week).   I explained that she had been treated with broad spectrum antibiotics and culture data could not yet direct outpatient therapy.  Leaving now risked that I could only giver her suboptimal treatment, with the posible outcome of limb loss or death.  She demonstrated that she could make decisions (was able to state several different contigency plans). It was confirmed that she had cephalexin written prescription in hand, had the phone number to contact Dr. Carlos Levering office for her planned 2 week follow up and that she would seek care for worsening in any way.     Discharge Diagnoses:  Principal Problem:   Cellulitis and abscess of hand Active Problems:   Polysubstance dependence (HCC)   Anxiety disorder   Cellulitis of both feet   Cellulitis of right hand   Cellulitis of hand, left    Discharge Instructions   Allergies as of 02/09/2017   No Known Allergies     Medication  List    STOP taking these medications   doxycycline 100 MG tablet Commonly known as:  VIBRA-TABS     TAKE these medications   cephALEXin 500 MG capsule Commonly known as:  KEFLEX Take 1 capsule (500 mg total) by mouth 3 (three) times daily.       No Known Allergies  Consultations:  Orthopedics   Procedures/Studies: Dg Hand Complete Left  Result Date: 02/06/2017 CLINICAL DATA:  Pain and swelling EXAM: LEFT HAND - COMPLETE 3+ VIEW COMPARISON:  None. FINDINGS: Frontal, oblique, and lateral views were obtained. There is marked soft tissue swelling throughout the dorsal aspect of the hand and wrist. No acute fracture or dislocation. Joint spaces appear normal. No erosive change or bony destruction. No radiopaque foreign body. IMPRESSION: Diffuse soft tissue swelling dorsally. No fracture or dislocation. No evident arthropathy. Electronically Signed   By: Bretta Bang III M.D.   On: 02/06/2017 08:14       Subjective: Patient alert and oriented.  Unhappy and eager to go home.  Discharge Exam: Vitals:   02/09/17 1045 02/09/17 1417  BP: 120/68 115/76  Pulse: 89 86  Resp: 18 17  Temp: 98.3 F (36.8 C) 98.5 F (36.9 C)  SpO2: 98% 97%   Vitals:   02/08/17 2110 02/09/17 0454 02/09/17 1045 02/09/17 1417  BP: 120/82 114/74 120/68 115/76  Pulse: (!) 106 88 89 86  Resp: 16 16 18 17   Temp: 98.7 F (37.1 C) 98.3 F (36.8 C) 98.3 F (36.8 C) 98.5 F (36.9 C)  TempSrc: Oral Oral  Oral Oral  SpO2: 96% 94% 98% 97%  Weight:      Height:        General: Pt is awake, not in acute distress, still appears somewhat sleepy, but is oriented and able to reason her options Cardiovascular: refuses Respiratory: refuses Abdominal: refuses Extremities: arm wrapped    The results of significant diagnostics from this hospitalization (including imaging, microbiology, ancillary and laboratory) are listed below for reference.     Microbiology: Recent Results (from the past 240  hour(s))  Culture, blood (routine x 2)     Status: None (Preliminary result)   Collection Time: 02/06/17  7:17 PM  Result Value Ref Range Status   Specimen Description BLOOD LEFT ARM  Final   Special Requests IN PEDIATRIC BOTTLE Blood Culture adequate volume  Final   Culture NO GROWTH 3 DAYS  Final   Report Status PENDING  Incomplete  Aerobic/Anaerobic Culture (surgical/deep wound)     Status: None (Preliminary result)   Collection Time: 02/06/17  8:47 PM  Result Value Ref Range Status   Specimen Description ABSCESS LEFT WRIST  Final   Special Requests NONE  Final   Gram Stain   Final    ABUNDANT WBC PRESENT, PREDOMINANTLY PMN NO ORGANISMS SEEN    Culture   Final    RARE STREPTOCOCCUS GROUP G NO ANAEROBES ISOLATED; CULTURE IN PROGRESS FOR 5 DAYS    Report Status PENDING  Incomplete  Aerobic/Anaerobic Culture (surgical/deep wound)     Status: None (Preliminary result)   Collection Time: 02/06/17  8:50 PM  Result Value Ref Range Status   Specimen Description TISSUE LEFT WRIST  Final   Special Requests NONE  Final   Gram Stain   Final    ABUNDANT WBC PRESENT,BOTH PMN AND MONONUCLEAR NO ORGANISMS SEEN    Culture   Final    RARE STREPTOCOCCUS GROUP G NO ANAEROBES ISOLATED; CULTURE IN PROGRESS FOR 5 DAYS    Report Status PENDING  Incomplete  Culture, blood (routine x 2)     Status: None (Preliminary result)   Collection Time: 02/08/17  2:40 PM  Result Value Ref Range Status   Specimen Description BLOOD RIGHT HAND  Final   Special Requests IN PEDIATRIC BOTTLE Blood Culture adequate volume  Final   Culture NO GROWTH < 24 HOURS  Final   Report Status PENDING  Incomplete  Culture, blood (routine x 2)     Status: None (Preliminary result)   Collection Time: 02/08/17  2:49 PM  Result Value Ref Range Status   Specimen Description BLOOD RIGHT HAND  Final   Special Requests IN PEDIATRIC BOTTLE Blood Culture adequate volume  Final   Culture NO GROWTH < 24 HOURS  Final   Report Status  PENDING  Incomplete     Labs: BNP (last 3 results) No results for input(s): BNP in the last 8760 hours. Basic Metabolic Panel:  Recent Labs Lab 02/06/17 0827 02/07/17 0247 02/07/17 1403 02/08/17 0512  NA 137 139  --  140  K 3.1* 2.9*  --  4.2  CL 98* 103  --  108  CO2 29 26  --  26  GLUCOSE 106* 103*  --  110*  BUN 13 10  --  12  CREATININE 0.55 0.61  --  0.52  CALCIUM 8.7* 8.1*  --  8.0*  MG  --   --  1.9  --    Liver Function Tests:  Recent Labs Lab 02/06/17 0827 02/07/17 0247  AST 92*  105*  ALT 91* 83*  ALKPHOS 57 46  BILITOT 0.8 0.7  PROT 7.6 5.5*  ALBUMIN 3.8 2.7*   No results for input(s): LIPASE, AMYLASE in the last 168 hours. No results for input(s): AMMONIA in the last 168 hours. CBC:  Recent Labs Lab 02/06/17 0827 02/07/17 0247  WBC 8.3 4.5  NEUTROABS 4.6  --   HGB 12.7 11.7*  HCT 37.8 35.6*  MCV 91.5 93.2  PLT 321 298   Cardiac Enzymes: No results for input(s): CKTOTAL, CKMB, CKMBINDEX, TROPONINI in the last 168 hours. BNP: Invalid input(s): POCBNP CBG: No results for input(s): GLUCAP in the last 168 hours. D-Dimer No results for input(s): DDIMER in the last 72 hours. Hgb A1c No results for input(s): HGBA1C in the last 72 hours. Lipid Profile No results for input(s): CHOL, HDL, LDLCALC, TRIG, CHOLHDL, LDLDIRECT in the last 72 hours. Thyroid function studies No results for input(s): TSH, T4TOTAL, T3FREE, THYROIDAB in the last 72 hours.  Invalid input(s): FREET3 Anemia work up No results for input(s): VITAMINB12, FOLATE, FERRITIN, TIBC, IRON, RETICCTPCT in the last 72 hours. Urinalysis    Component Value Date/Time   COLORURINE YELLOW 02/06/2017 1200   APPEARANCEUR CLOUDY (A) 02/06/2017 1200   LABSPEC >1.030 (H) 02/06/2017 1200   PHURINE 6.0 02/06/2017 1200   GLUCOSEU NEGATIVE 02/06/2017 1200   HGBUR NEGATIVE 02/06/2017 1200   BILIRUBINUR NEGATIVE 02/06/2017 1200   BILIRUBINUR Small 04/15/2014 1147   KETONESUR 15 (A) 02/06/2017  1200   PROTEINUR NEGATIVE 02/06/2017 1200   UROBILINOGEN 1.0 04/15/2014 1147   UROBILINOGEN 0.2 07/30/2012 1120   NITRITE POSITIVE (A) 02/06/2017 1200   LEUKOCYTESUR SMALL (A) 02/06/2017 1200   Sepsis Labs Invalid input(s): PROCALCITONIN,  WBC,  LACTICIDVEN Microbiology Recent Results (from the past 240 hour(s))  Culture, blood (routine x 2)     Status: None (Preliminary result)   Collection Time: 02/06/17  7:17 PM  Result Value Ref Range Status   Specimen Description BLOOD LEFT ARM  Final   Special Requests IN PEDIATRIC BOTTLE Blood Culture adequate volume  Final   Culture NO GROWTH 3 DAYS  Final   Report Status PENDING  Incomplete  Aerobic/Anaerobic Culture (surgical/deep wound)     Status: None (Preliminary result)   Collection Time: 02/06/17  8:47 PM  Result Value Ref Range Status   Specimen Description ABSCESS LEFT WRIST  Final   Special Requests NONE  Final   Gram Stain   Final    ABUNDANT WBC PRESENT, PREDOMINANTLY PMN NO ORGANISMS SEEN    Culture   Final    RARE STREPTOCOCCUS GROUP G NO ANAEROBES ISOLATED; CULTURE IN PROGRESS FOR 5 DAYS    Report Status PENDING  Incomplete  Aerobic/Anaerobic Culture (surgical/deep wound)     Status: None (Preliminary result)   Collection Time: 02/06/17  8:50 PM  Result Value Ref Range Status   Specimen Description TISSUE LEFT WRIST  Final   Special Requests NONE  Final   Gram Stain   Final    ABUNDANT WBC PRESENT,BOTH PMN AND MONONUCLEAR NO ORGANISMS SEEN    Culture   Final    RARE STREPTOCOCCUS GROUP G NO ANAEROBES ISOLATED; CULTURE IN PROGRESS FOR 5 DAYS    Report Status PENDING  Incomplete  Culture, blood (routine x 2)     Status: None (Preliminary result)   Collection Time: 02/08/17  2:40 PM  Result Value Ref Range Status   Specimen Description BLOOD RIGHT HAND  Final   Special Requests IN PEDIATRIC BOTTLE  Blood Culture adequate volume  Final   Culture NO GROWTH < 24 HOURS  Final   Report Status PENDING  Incomplete   Culture, blood (routine x 2)     Status: None (Preliminary result)   Collection Time: 02/08/17  2:49 PM  Result Value Ref Range Status   Specimen Description BLOOD RIGHT HAND  Final   Special Requests IN PEDIATRIC BOTTLE Blood Culture adequate volume  Final   Culture NO GROWTH < 24 HOURS  Final   Report Status PENDING  Incomplete     Time coordinating discharge: less than 30 minutes  SIGNED:   Alberteen Sam, MD  [facility] 02/09/2017, 3:23 PM   If 7PM-7AM, please contact night-coverage [email or web address] Password [email or web address]

## 2017-02-09 NOTE — Progress Notes (Signed)
Patient ID: Madison Garza, female   DOB: 11-19-1975, 41 y.o.   MRN: [id number]  Patient seen and examined  Drain removed  Patient is neurovascularly intact.  I discussed with patient the findings.  No further washouts planned.  She has had 2 washouts secondary to significant infectious tenosynovitis. She was closed yesterday and look quite well.  I would recommend discharge on oral antibiotics and follow-up in my office in 12-14 days once cultures are final   I discussed all issues at length  Cristi Gwynn M.D.

## 2017-02-09 NOTE — Op Note (Signed)
NAMRuthe Garza:  Nay, DNAE                 ACCOUNT NO.:  [id number]  MEDICAL RECORD NO.:  [id number]  LOCATION:                                 FACILITY:  PHYSICIAN:  Dionne AnoWilliam M. Amanda PeaGramig, M.D.     DATE OF BIRTH:  DATE OF PROCEDURE:  02/06/2017 DATE OF DISCHARGE:                              OPERATIVE REPORT   PREOPERATIVE DIAGNOSES:  Status post irrigation and debridement of deep infection, hand, wrist, forearm with extensor tenolysis, tenosynovectomy.  POSTOPERATIVE DIAGNOSES:  Status post irrigation and debridement of deep infection, hand, wrist, forearm with extensor tenolysis, tenosynovectomy.  PROCEDURE: 1. Repeat irrigation and debridement of skin, subcutaneous tissue,     tendon, muscle, and periosteal tissue.  This was an excisional     debridement with curette, knife, blade, and scissors over a 10 cm x     4 cm region. 2. Radical tenosynovectomy second, fourth, third, and fifth dorsal     compartments, left forearm, wrist, and hand. 3. Complex wound closure, left hand, wrist, forearm; approximately 10     cm.  SURGEON:  Dionne AnoWilliam M. Amanda PeaGramig, M.D.  ASSISTANT:  None.  COMPLICATIONS:  None.  ANESTHESIA:  General.  INDICATIONS:  The patient presents for repeat I and D and repair, reconstruction as necessary.  I have counseled regarding risks and benefits of surgery, and she is asked to proceed.  DESCRIPTION OF PROCEDURE:  The patient was seen by myself and Anesthesia, taken to the operative theater, underwent smooth induction of general anesthesia.  Preoperatively, I discussed with her all issues.  In the operative arena, she underwent a careful preop Hibiclens scrub, followed by 10 minutes surgical Betadine scrub and paint, followed by time-out being observed and sterile field being applied.  The patient then underwent repeat I and D of a 10 x 4 cm region.  Skin, subcutaneous tissue, muscle, tendon, and periosteal tissue was performed.  This was an excisional debridement  with curette, knife, blade, and scissors. Following this, I irrigated and performed a tenolysis, tenosynovectomy radical nature of the second through fifth dorsal compartments.  The EPL, EDC, EDM, ECRB, and ECRL tendons were all addressed with tenolysis, tenosynovectomy.  A portion of the EDC to the index finger underwent a tenotomy as this was frayed and ruptured attritionally.  All of the tendons were of fairly poor quality except for the ECRB due to the infectious process.  Saline 3 L was placed through and through followed by placement of a TLS size 10 drain, followed by wound closure with Prolene and tourniquet deflation.  Excellent refill was noted.  Short-arm splint was applied.  This will be her last washout.  We will continue IV antibiotics, await cultures, and move forward accordingly.  Ultimately, I would keep her still at the MCP joints for 4 weeks and begin aggressive motion. We have discussed relevant issues, concerns, do's and don'ts and all questions have been encouraged and answered.  Her wound conditions were much improved.  I felt comfortable closing her today.     Dionne AnoWilliam M. Amanda PeaGramig, M.D.   ______________________________ Dionne AnoWilliam M. Amanda PeaGramig, M.D.    [facility]  D:  02/08/2017  T:  02/08/2017  Job:  [id number] 

## 2017-02-11 LAB — AEROBIC/ANAEROBIC CULTURE (SURGICAL/DEEP WOUND)

## 2017-02-11 LAB — CULTURE, BLOOD (ROUTINE X 2)
CULTURE: NO GROWTH
Culture: NO GROWTH
SPECIAL REQUESTS: ADEQUATE
Special Requests: ADEQUATE

## 2017-02-11 LAB — AEROBIC/ANAEROBIC CULTURE W GRAM STAIN (SURGICAL/DEEP WOUND)

## 2017-02-12 NOTE — Anesthesia Postprocedure Evaluation (Signed)
Anesthesia Post Note  Patient: Madison Garza  Procedure(s) Performed: REPEAT IRRIGATION AND DEBRIDEMENT LEFT HAND (Left )     Patient location during evaluation: PACU Anesthesia Type: General Level of consciousness: awake and alert Pain management: pain level controlled Vital Signs Assessment: post-procedure vital signs reviewed and stable Respiratory status: spontaneous breathing, nonlabored ventilation, respiratory function stable and patient connected to nasal cannula oxygen Cardiovascular status: blood pressure returned to baseline and stable Postop Assessment: no apparent nausea or vomiting Anesthetic complications: no    Last Vitals:  Vitals:   02/09/17 1045 02/09/17 1417  BP: 120/68 115/76  Pulse: 89 86  Resp: 18 17  Temp: 36.8 C 36.9 C  SpO2: 98% 97%    Last Pain:  Vitals:   02/09/17 1417  TempSrc: Oral  PainSc:                  Ryin Schillo S

## 2017-02-13 LAB — CULTURE, BLOOD (ROUTINE X 2)
CULTURE: NO GROWTH
CULTURE: NO GROWTH
SPECIAL REQUESTS: ADEQUATE
SPECIAL REQUESTS: ADEQUATE

## 2017-03-17 DEATH — deceased

## 2018-05-17 IMAGING — CT CT HEAD W/O CM
3 series · 17 of 47 positions shown, 20 images · non-contrast
Comparison: 11/05/2012

CLINICAL DATA: 39-year-old female with acute altered mental status.

EXAM:
CT HEAD WITHOUT CONTRAST
TECHNIQUE: Contiguous axial images were obtained from the base of the skull
through the vertex without intravenous contrast.

[Series 2: headseq 4.8 h45s · axial · 0.43mm/px · z∈[-161,-35]mm · 11 of 33 slices shown, 14 images]
[im 3/33  brain]
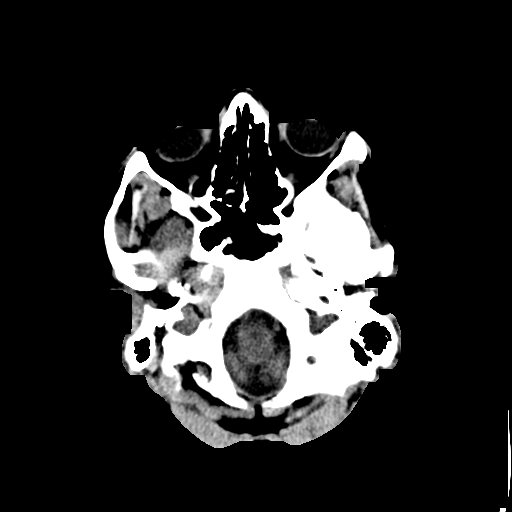
[im 3/33  bone]
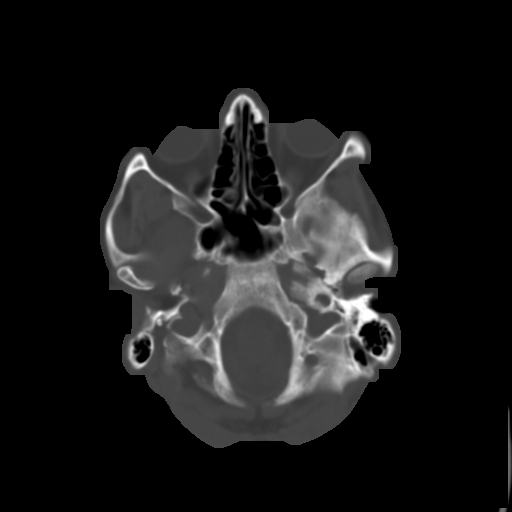
[im 5/33  brain]
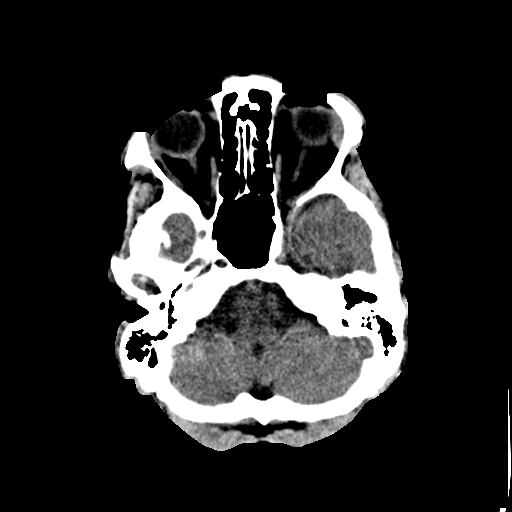
[im 8/33  brain]
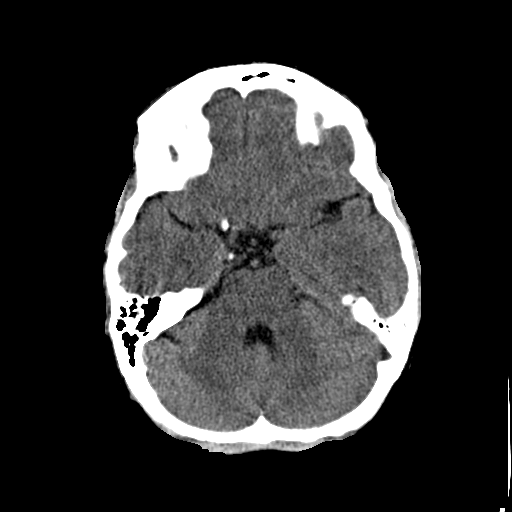
[im 10/33  brain]
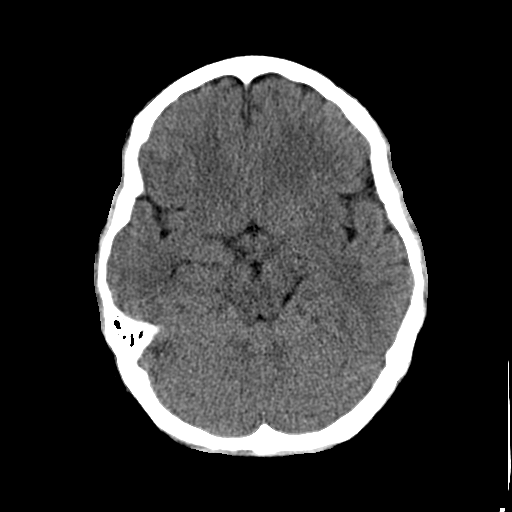
[im 14/33  brain]
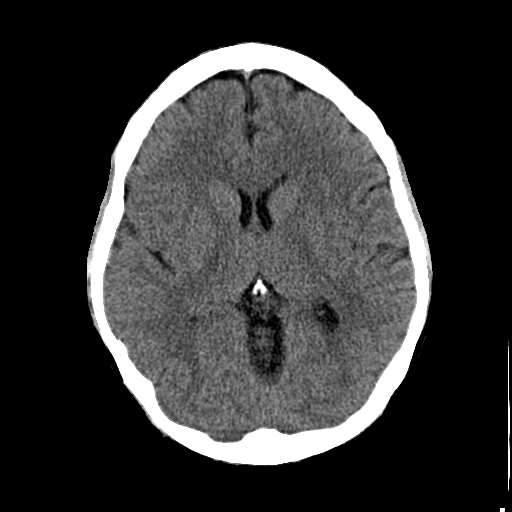
[im 14/33  bone]
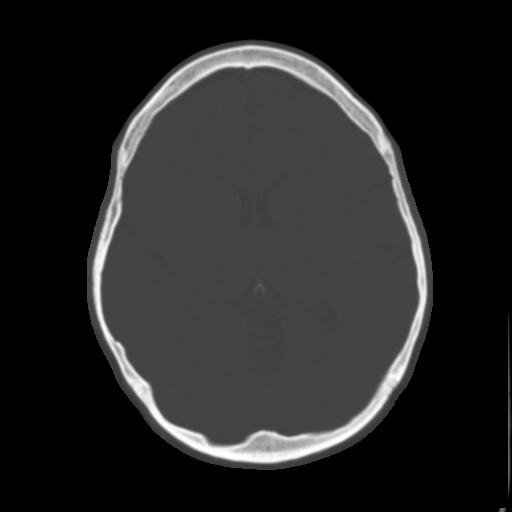
[im 17/33  brain]
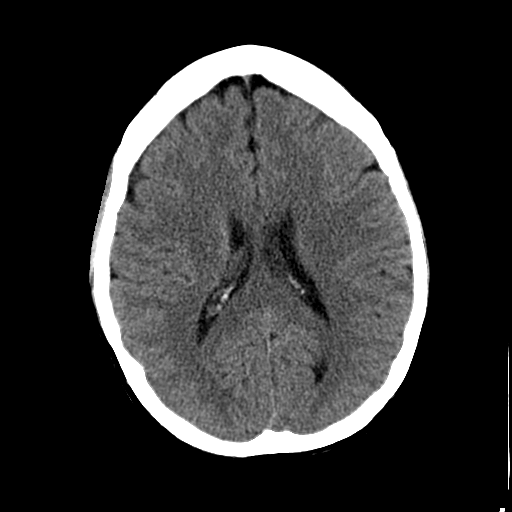
[im 19/33  brain]
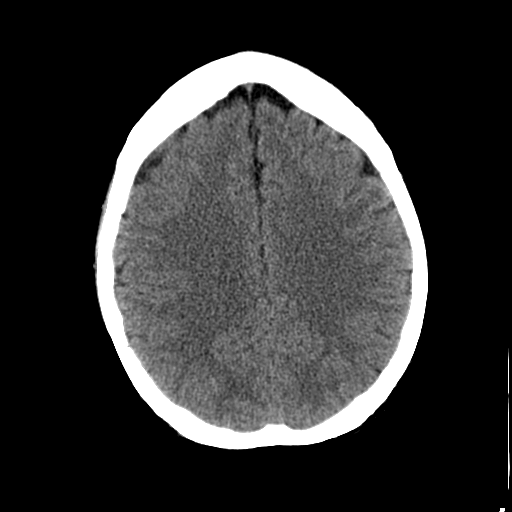
[im 23/33  brain]
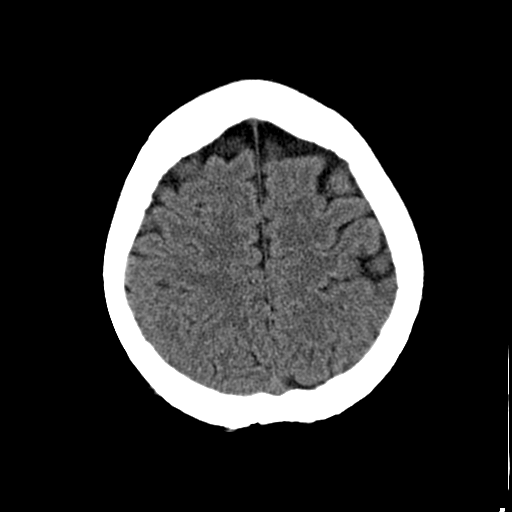
[im 25/33  brain]
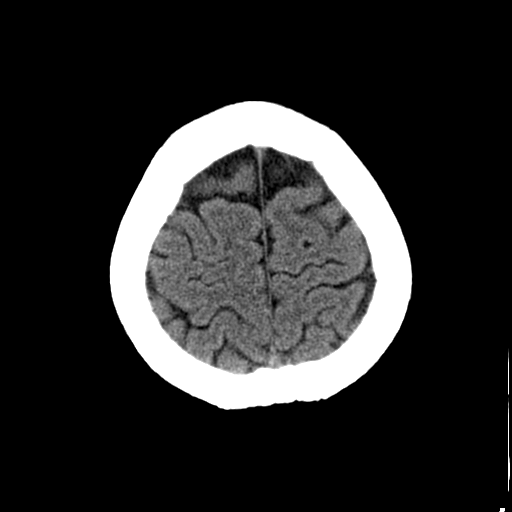
[im 25/33  bone]
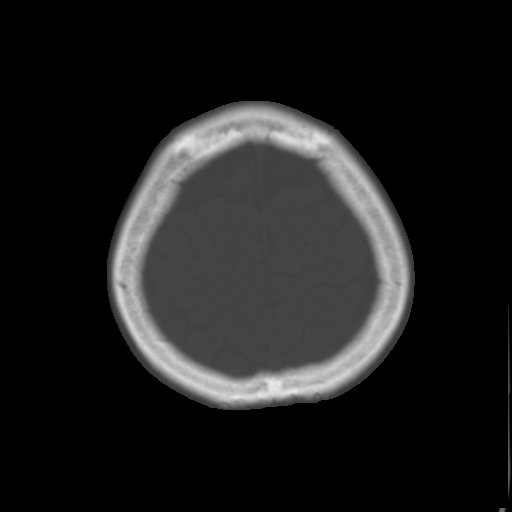
[im 28/33  brain]
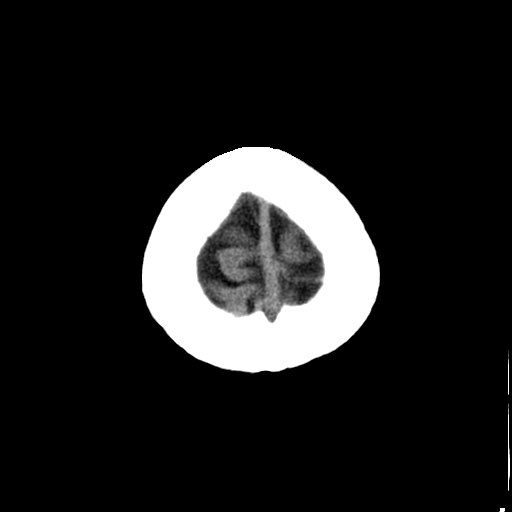
[im 30/33  brain]
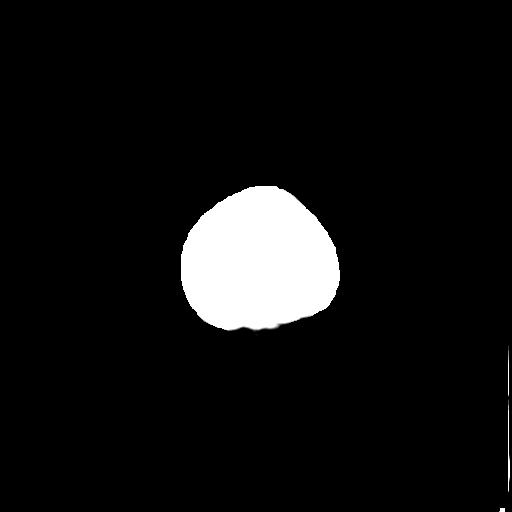

[Series 602: cor · coronal · 0.45mm/px · 3 of 62 slices shown]
[im 21/62  brain]
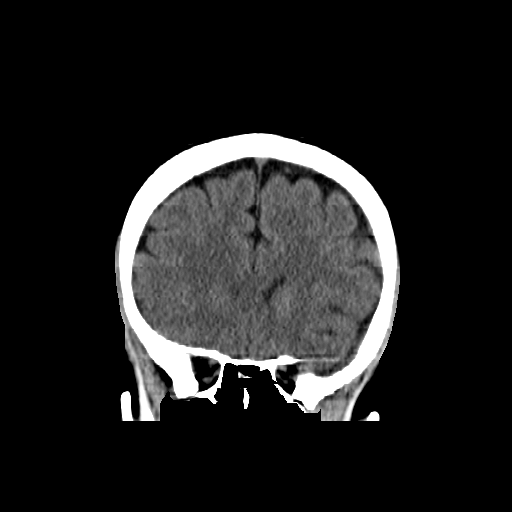
[im 28/62  brain]
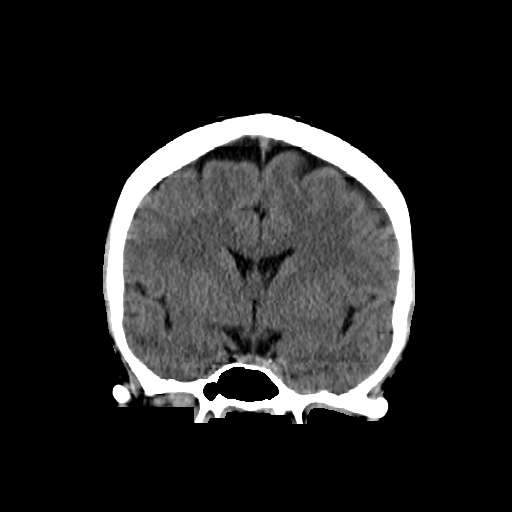
[im 34/62  brain]
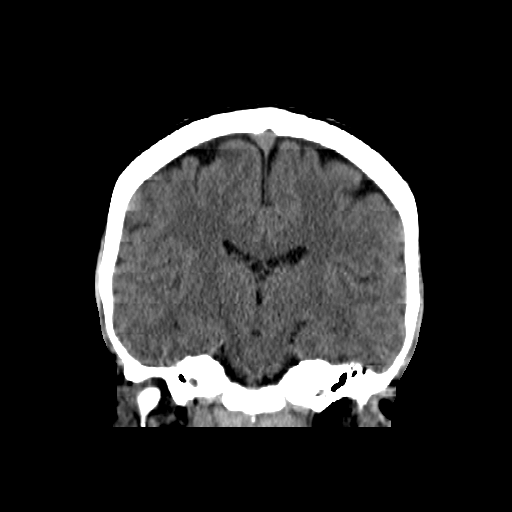

[Series 603: sag · sagittal · 0.45mm/px · 3 of 49 slices shown]
[im 17/49  brain]
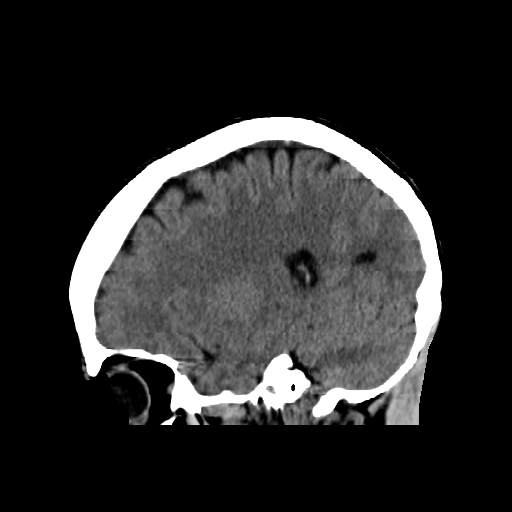
[im 25/49  brain]
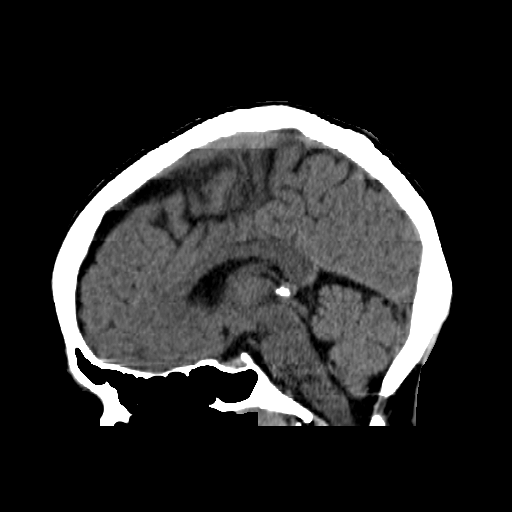
[im 33/49  brain]
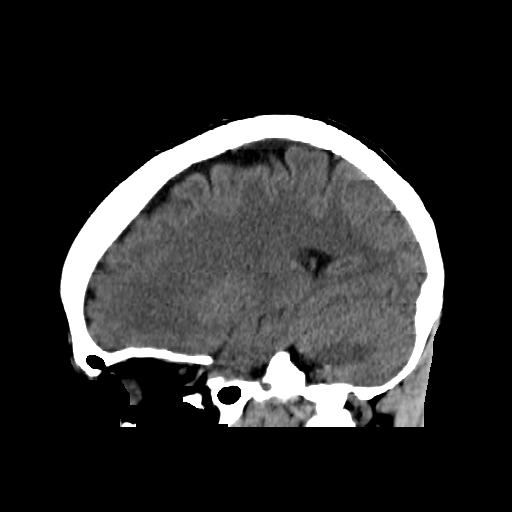

[17 of 47 positions shown; findings below may reference images not displayed]

FINDINGS: Brain: No evidence of infarction, hemorrhage, hydrocephalus,
extra-axial collection or mass lesion/mass effect.

Vascular: No abnormalities identified.

Skull: Unremarkable

Sinuses/Orbits: Visualized portions unremarkable.

Other: None
IMPRESSION: Unremarkable noncontrast head CT.

## 2018-05-18 IMAGING — DX DG CHEST 2V
2 series · 2 of 2 positions shown · non-contrast
Comparison: 12/16/2015 .

CLINICAL DATA: Shortness of breath.

EXAM:
CHEST  2 VIEW

[chest lat]
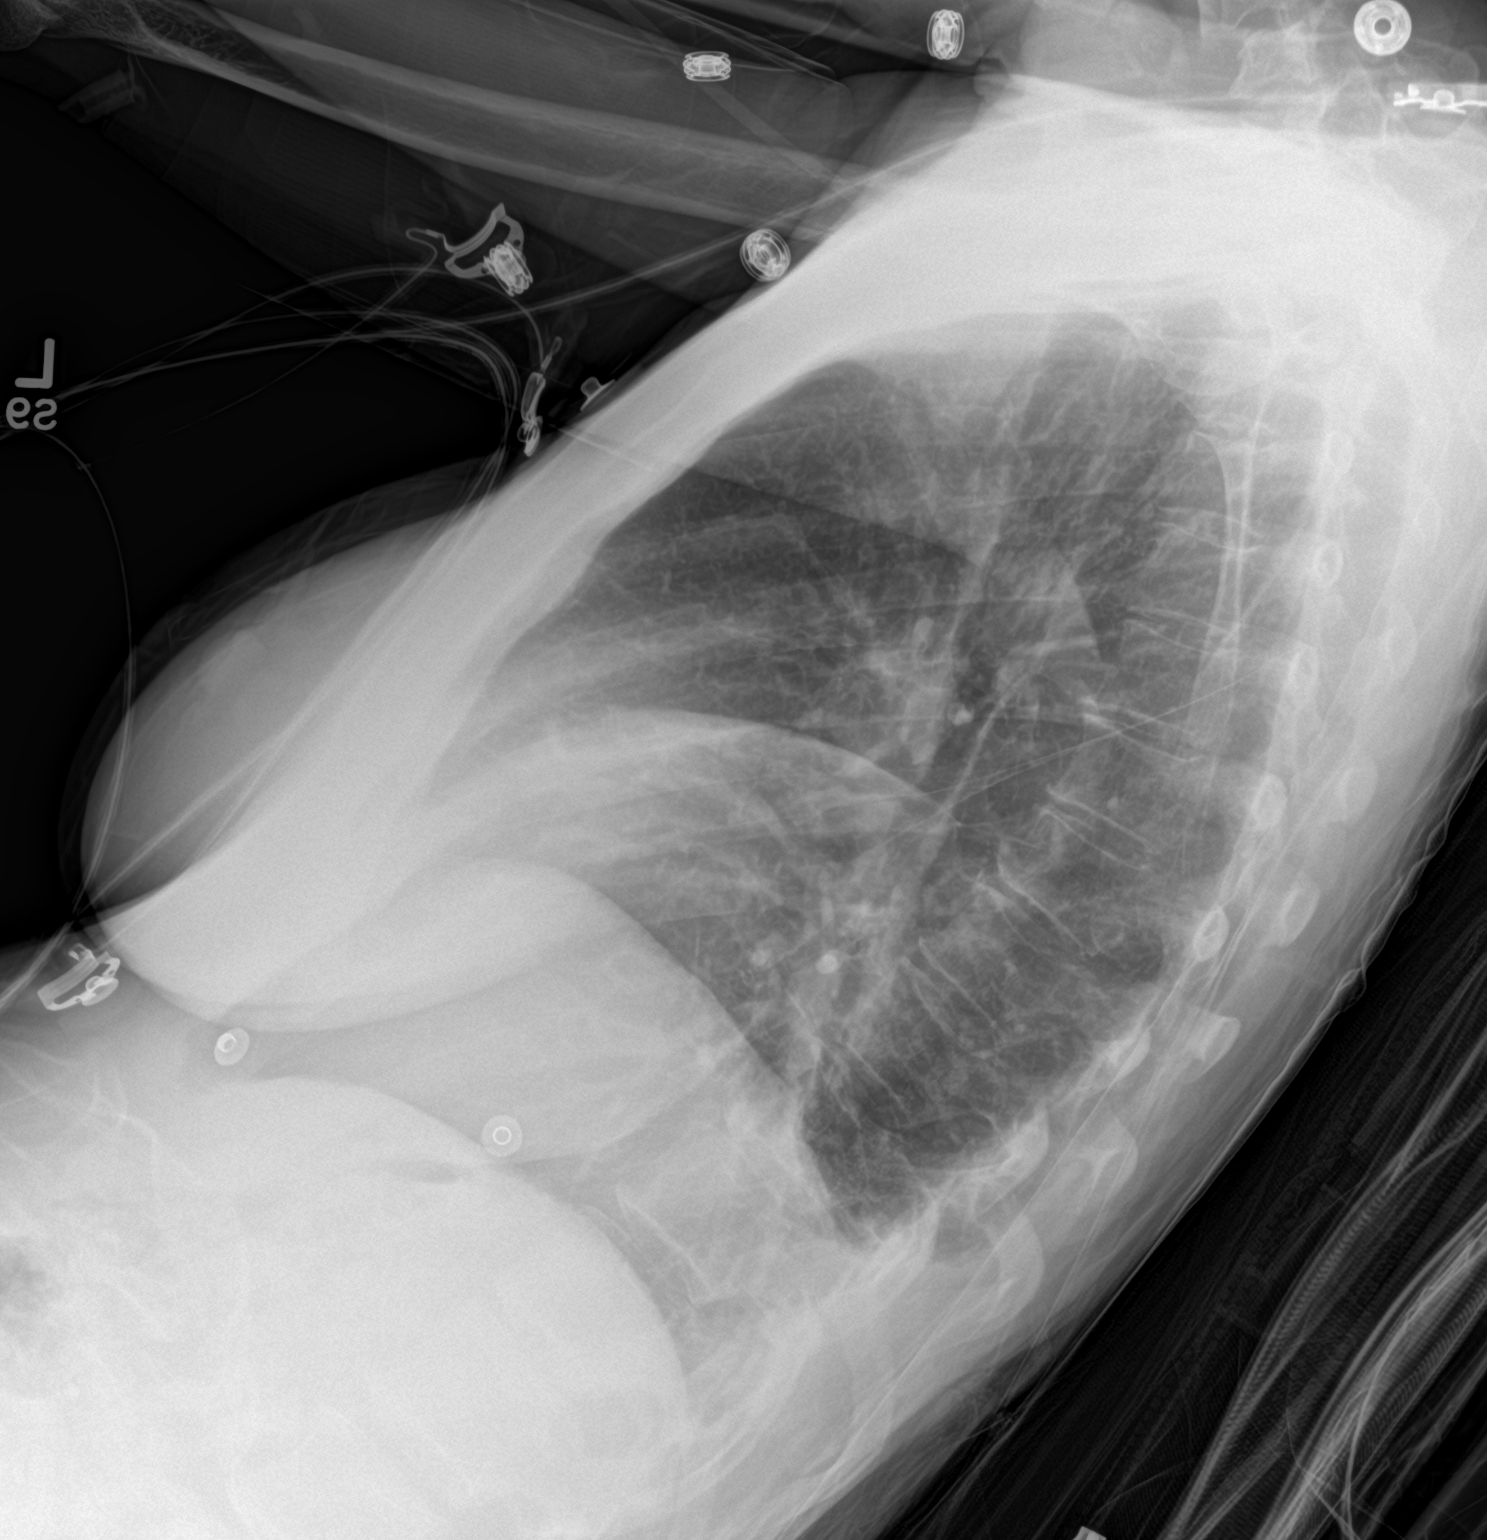

[chest ap]
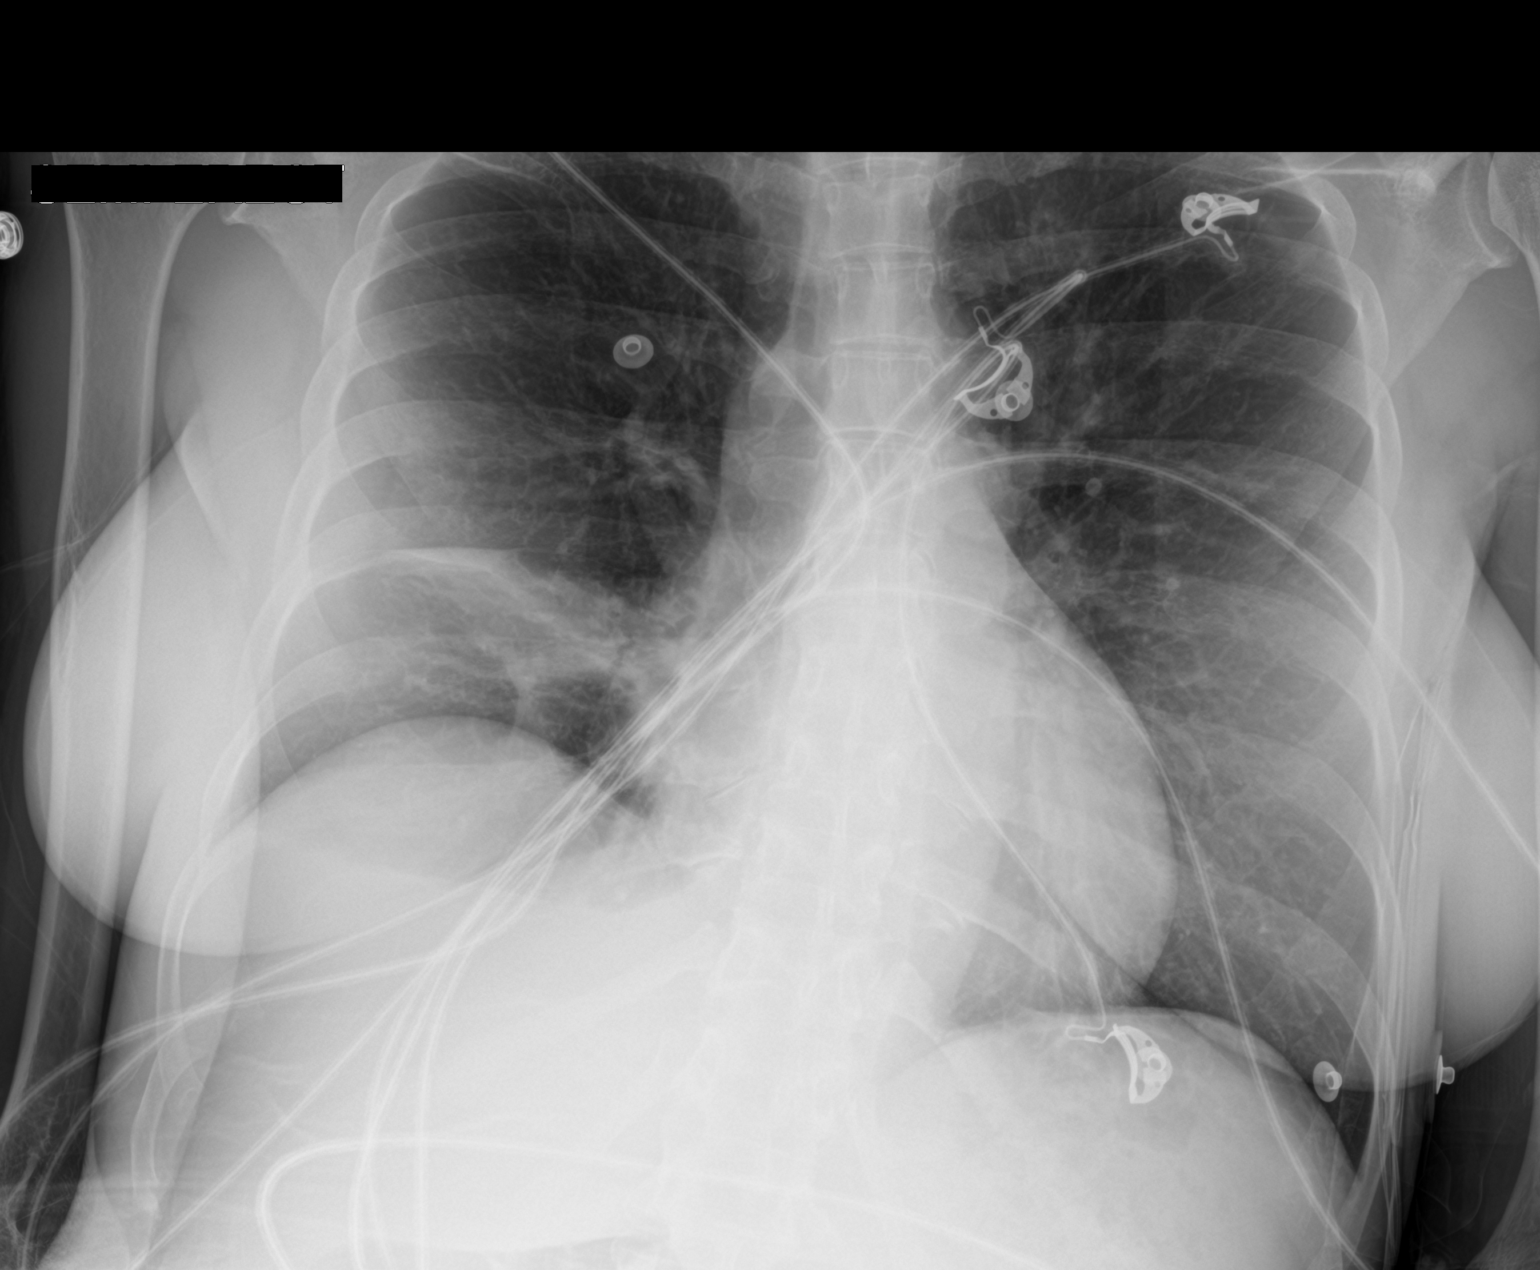

[2 of 2 positions shown; findings below may reference images not displayed]

FINDINGS: Mediastinum and hilar structures normal. Borderline cardiomegaly. No
pulmonary venous congestion Right middle lobe atelectasis and or
infiltrate noted. No pleural effusion or pneumothorax. Small right
pleural effusion. No pneumothorax.
IMPRESSION: 1. Right middle lobe atelectasis and/or infiltrate. Right middle
lobe pneumonia cannot be excluded. Aspiration cannot be excluded.
Small right pleural effusion.

2.  Borderline cardiomegaly.
